# Patient Record
Sex: Male | Born: 1945 | Race: White | Hispanic: No | Smoking: Former smoker
Health system: Southern US, Community
[De-identification: ages and names within clinical notes are randomized; demographics above are authoritative.]

## PROBLEM LIST (undated history)

## (undated) DIAGNOSIS — I25810 Atherosclerosis of coronary artery bypass graft(s) without angina pectoris: Secondary | ICD-10-CM

## (undated) DIAGNOSIS — K1370 Unspecified lesions of oral mucosa: Secondary | ICD-10-CM

## (undated) DIAGNOSIS — R0600 Dyspnea, unspecified: Secondary | ICD-10-CM

## (undated) DIAGNOSIS — R011 Cardiac murmur, unspecified: Secondary | ICD-10-CM

## (undated) DIAGNOSIS — M1990 Unspecified osteoarthritis, unspecified site: Secondary | ICD-10-CM

## (undated) DIAGNOSIS — I350 Nonrheumatic aortic (valve) stenosis: Secondary | ICD-10-CM

## (undated) DIAGNOSIS — I712 Thoracic aortic aneurysm, without rupture: Secondary | ICD-10-CM

## (undated) DIAGNOSIS — I509 Heart failure, unspecified: Secondary | ICD-10-CM

## (undated) DIAGNOSIS — K219 Gastro-esophageal reflux disease without esophagitis: Secondary | ICD-10-CM

## (undated) DIAGNOSIS — I7121 Aneurysm of the ascending aorta, without rupture: Secondary | ICD-10-CM

## (undated) DIAGNOSIS — J449 Chronic obstructive pulmonary disease, unspecified: Secondary | ICD-10-CM

## (undated) DIAGNOSIS — I10 Essential (primary) hypertension: Secondary | ICD-10-CM

## (undated) DIAGNOSIS — Z720 Tobacco use: Secondary | ICD-10-CM

## (undated) DIAGNOSIS — Z973 Presence of spectacles and contact lenses: Secondary | ICD-10-CM

## (undated) HISTORY — PX: COLONOSCOPY: SHX174

## (undated) HISTORY — DX: Atherosclerosis of coronary artery bypass graft(s) without angina pectoris: I25.810

## (undated) HISTORY — DX: Aneurysm of the ascending aorta, without rupture: I71.21

## (undated) HISTORY — DX: Tobacco use: Z72.0

## (undated) HISTORY — PX: AORTIC VALVE REPLACEMENT: SHX41

## (undated) HISTORY — PX: TONSILLECTOMY: SUR1361

## (undated) HISTORY — DX: Chronic obstructive pulmonary disease, unspecified: J44.9

## (undated) HISTORY — DX: Nonrheumatic aortic (valve) stenosis: I35.0

## (undated) HISTORY — DX: Unspecified osteoarthritis, unspecified site: M19.90

## (undated) HISTORY — DX: Thoracic aortic aneurysm, without rupture: I71.2

## (undated) HISTORY — PX: MULTIPLE TOOTH EXTRACTIONS: SHX2053

---

## 1998-12-22 ENCOUNTER — Emergency Department (HOSPITAL_COMMUNITY): Admission: EM | Admit: 1998-12-22 | Discharge: 1998-12-22 | Payer: Self-pay | Admitting: Emergency Medicine

## 2002-03-04 ENCOUNTER — Emergency Department (HOSPITAL_COMMUNITY): Admission: EM | Admit: 2002-03-04 | Discharge: 2002-03-04 | Payer: Self-pay | Admitting: Emergency Medicine

## 2002-03-04 ENCOUNTER — Encounter: Payer: Self-pay | Admitting: Emergency Medicine

## 2002-03-21 ENCOUNTER — Ambulatory Visit (HOSPITAL_COMMUNITY): Admission: RE | Admit: 2002-03-21 | Discharge: 2002-03-21 | Payer: Self-pay | Admitting: Internal Medicine

## 2002-03-21 ENCOUNTER — Encounter (INDEPENDENT_AMBULATORY_CARE_PROVIDER_SITE_OTHER): Payer: Self-pay | Admitting: Cardiology

## 2004-11-23 ENCOUNTER — Inpatient Hospital Stay (HOSPITAL_COMMUNITY): Admission: EM | Admit: 2004-11-23 | Discharge: 2004-11-26 | Payer: Self-pay | Admitting: Emergency Medicine

## 2004-11-23 ENCOUNTER — Ambulatory Visit: Payer: Self-pay | Admitting: Internal Medicine

## 2004-11-24 ENCOUNTER — Encounter: Payer: Self-pay | Admitting: Cardiology

## 2004-11-24 ENCOUNTER — Ambulatory Visit: Payer: Self-pay | Admitting: Cardiology

## 2004-11-25 ENCOUNTER — Ambulatory Visit: Payer: Self-pay | Admitting: Cardiology

## 2004-12-23 ENCOUNTER — Ambulatory Visit: Payer: Self-pay | Admitting: Cardiology

## 2004-12-23 ENCOUNTER — Encounter (INDEPENDENT_AMBULATORY_CARE_PROVIDER_SITE_OTHER): Payer: Self-pay | Admitting: Internal Medicine

## 2004-12-27 ENCOUNTER — Ambulatory Visit: Payer: Self-pay | Admitting: Cardiology

## 2004-12-27 ENCOUNTER — Ambulatory Visit: Payer: Self-pay | Admitting: Internal Medicine

## 2005-01-12 ENCOUNTER — Ambulatory Visit: Payer: Self-pay | Admitting: Internal Medicine

## 2005-01-12 ENCOUNTER — Encounter (INDEPENDENT_AMBULATORY_CARE_PROVIDER_SITE_OTHER): Payer: Self-pay | Admitting: Internal Medicine

## 2005-03-09 ENCOUNTER — Ambulatory Visit: Payer: Self-pay | Admitting: Cardiology

## 2005-04-07 ENCOUNTER — Emergency Department (HOSPITAL_COMMUNITY): Admission: EM | Admit: 2005-04-07 | Discharge: 2005-04-07 | Payer: Self-pay | Admitting: Emergency Medicine

## 2005-04-12 ENCOUNTER — Ambulatory Visit: Payer: Self-pay | Admitting: Internal Medicine

## 2005-04-26 ENCOUNTER — Ambulatory Visit: Payer: Self-pay | Admitting: Internal Medicine

## 2005-05-03 ENCOUNTER — Ambulatory Visit: Payer: Self-pay | Admitting: Internal Medicine

## 2005-05-06 ENCOUNTER — Ambulatory Visit: Payer: Self-pay | Admitting: Internal Medicine

## 2005-05-13 ENCOUNTER — Ambulatory Visit: Payer: Self-pay | Admitting: Internal Medicine

## 2005-05-25 ENCOUNTER — Ambulatory Visit: Payer: Self-pay | Admitting: Cardiology

## 2005-06-01 ENCOUNTER — Ambulatory Visit: Payer: Self-pay | Admitting: Internal Medicine

## 2005-06-29 ENCOUNTER — Ambulatory Visit: Payer: Self-pay | Admitting: Cardiology

## 2005-07-13 ENCOUNTER — Ambulatory Visit: Payer: Self-pay

## 2005-07-20 ENCOUNTER — Ambulatory Visit: Payer: Self-pay | Admitting: Cardiology

## 2005-10-28 ENCOUNTER — Ambulatory Visit: Payer: Self-pay | Admitting: Cardiology

## 2006-01-04 ENCOUNTER — Emergency Department (HOSPITAL_COMMUNITY): Admission: EM | Admit: 2006-01-04 | Discharge: 2006-01-05 | Payer: Self-pay | Admitting: *Deleted

## 2006-01-08 ENCOUNTER — Ambulatory Visit: Payer: Self-pay | Admitting: Internal Medicine

## 2006-01-08 ENCOUNTER — Ambulatory Visit: Payer: Self-pay | Admitting: Cardiology

## 2006-01-08 ENCOUNTER — Inpatient Hospital Stay (HOSPITAL_COMMUNITY): Admission: EM | Admit: 2006-01-08 | Discharge: 2006-02-02 | Payer: Self-pay | Admitting: Emergency Medicine

## 2006-01-09 ENCOUNTER — Encounter: Payer: Self-pay | Admitting: Internal Medicine

## 2006-01-12 ENCOUNTER — Ambulatory Visit: Payer: Self-pay | Admitting: Dentistry

## 2006-01-12 ENCOUNTER — Encounter: Payer: Self-pay | Admitting: Vascular Surgery

## 2006-01-18 ENCOUNTER — Encounter (INDEPENDENT_AMBULATORY_CARE_PROVIDER_SITE_OTHER): Payer: Self-pay | Admitting: Specialist

## 2006-01-30 ENCOUNTER — Encounter: Payer: Self-pay | Admitting: Vascular Surgery

## 2006-02-16 ENCOUNTER — Ambulatory Visit: Payer: Self-pay | Admitting: Cardiology

## 2006-02-21 ENCOUNTER — Encounter
Admission: RE | Admit: 2006-02-21 | Discharge: 2006-02-21 | Payer: Self-pay | Admitting: Thoracic Surgery (Cardiothoracic Vascular Surgery)

## 2006-03-27 ENCOUNTER — Ambulatory Visit: Payer: Self-pay

## 2006-03-27 ENCOUNTER — Ambulatory Visit: Payer: Self-pay | Admitting: Cardiology

## 2006-04-12 ENCOUNTER — Ambulatory Visit (HOSPITAL_COMMUNITY): Admission: RE | Admit: 2006-04-12 | Discharge: 2006-04-12 | Payer: Self-pay | Admitting: Internal Medicine

## 2006-04-12 ENCOUNTER — Ambulatory Visit: Payer: Self-pay | Admitting: Cardiology

## 2006-04-12 ENCOUNTER — Ambulatory Visit: Payer: Self-pay | Admitting: Internal Medicine

## 2006-04-26 ENCOUNTER — Ambulatory Visit: Payer: Self-pay | Admitting: Hospitalist

## 2006-05-03 ENCOUNTER — Ambulatory Visit (HOSPITAL_COMMUNITY): Admission: RE | Admit: 2006-05-03 | Discharge: 2006-05-03 | Payer: Self-pay | Admitting: Hospitalist

## 2006-07-03 ENCOUNTER — Ambulatory Visit: Payer: Self-pay | Admitting: Cardiology

## 2006-10-10 DIAGNOSIS — J441 Chronic obstructive pulmonary disease with (acute) exacerbation: Secondary | ICD-10-CM | POA: Insufficient documentation

## 2006-10-10 DIAGNOSIS — Z954 Presence of other heart-valve replacement: Secondary | ICD-10-CM | POA: Insufficient documentation

## 2006-10-10 DIAGNOSIS — I359 Nonrheumatic aortic valve disorder, unspecified: Secondary | ICD-10-CM | POA: Insufficient documentation

## 2006-10-10 DIAGNOSIS — M1990 Unspecified osteoarthritis, unspecified site: Secondary | ICD-10-CM | POA: Insufficient documentation

## 2006-12-29 ENCOUNTER — Ambulatory Visit: Payer: Self-pay | Admitting: Cardiology

## 2007-01-08 ENCOUNTER — Ambulatory Visit: Payer: Self-pay | Admitting: Cardiology

## 2007-01-08 LAB — CONVERTED CEMR LAB
ALT: 19 units/L (ref 0–40)
AST: 19 units/L (ref 0–37)
Albumin: 3.6 g/dL (ref 3.5–5.2)
Alkaline Phosphatase: 40 units/L (ref 39–117)
Bilirubin, Direct: 0.1 mg/dL (ref 0.0–0.3)
Cholesterol: 148 mg/dL (ref 0–200)
HDL: 54.2 mg/dL (ref >39.0)
LDL Cholesterol: 71 mg/dL (ref 0–99)
Total Bilirubin: 0.6 mg/dL (ref 0.3–1.2)
Total CHOL/HDL Ratio: 2.7
Total Protein: 6.1 g/dL (ref 6.0–8.3)
Triglycerides: 112 mg/dL (ref 0–149)
VLDL: 22 mg/dL (ref 0–40)

## 2007-04-13 ENCOUNTER — Telehealth (INDEPENDENT_AMBULATORY_CARE_PROVIDER_SITE_OTHER): Payer: Self-pay | Admitting: *Deleted

## 2007-05-04 ENCOUNTER — Telehealth (INDEPENDENT_AMBULATORY_CARE_PROVIDER_SITE_OTHER): Payer: Self-pay | Admitting: *Deleted

## 2007-06-11 ENCOUNTER — Ambulatory Visit: Payer: Self-pay | Admitting: Cardiology

## 2007-06-25 ENCOUNTER — Ambulatory Visit: Payer: Self-pay | Admitting: Internal Medicine

## 2007-06-25 DIAGNOSIS — G2581 Restless legs syndrome: Secondary | ICD-10-CM | POA: Insufficient documentation

## 2007-07-11 ENCOUNTER — Encounter (INDEPENDENT_AMBULATORY_CARE_PROVIDER_SITE_OTHER): Payer: Self-pay | Admitting: Infectious Diseases

## 2007-07-11 ENCOUNTER — Ambulatory Visit: Payer: Self-pay | Admitting: Internal Medicine

## 2007-07-11 LAB — CONVERTED CEMR LAB
ALT: 13 units/L (ref 0–53)
AST: 15 units/L (ref 0–37)
Albumin: 4.3 g/dL (ref 3.5–5.2)
Alkaline Phosphatase: 44 units/L (ref 39–117)
BUN: 16 mg/dL (ref 6–23)
CO2: 26 meq/L (ref 19–32)
Calcium: 8.7 mg/dL (ref 8.4–10.5)
Chloride: 107 meq/L (ref 96–112)
Creatinine, Ser: 0.99 mg/dL (ref 0.40–1.50)
Glucose, Bld: 91 mg/dL (ref 70–99)
Potassium: 4.4 meq/L (ref 3.5–5.3)
Sodium: 141 meq/L (ref 135–145)
Total Bilirubin: 0.6 mg/dL (ref 0.3–1.2)
Total Protein: 6.7 g/dL (ref 6.0–8.3)

## 2007-09-06 ENCOUNTER — Telehealth: Payer: Self-pay | Admitting: Internal Medicine

## 2007-12-07 ENCOUNTER — Encounter: Payer: Self-pay | Admitting: Cardiology

## 2007-12-07 ENCOUNTER — Ambulatory Visit: Payer: Self-pay

## 2007-12-07 ENCOUNTER — Ambulatory Visit: Payer: Self-pay | Admitting: Cardiology

## 2007-12-24 IMAGING — CR DG CHEST 2V
2 series · 2 of 2 positions shown · non-contrast
Comparison: none

CLINICAL DATA: Shortness of breath. 
 CHEST ? 2 VIEW:

[w chest pa]
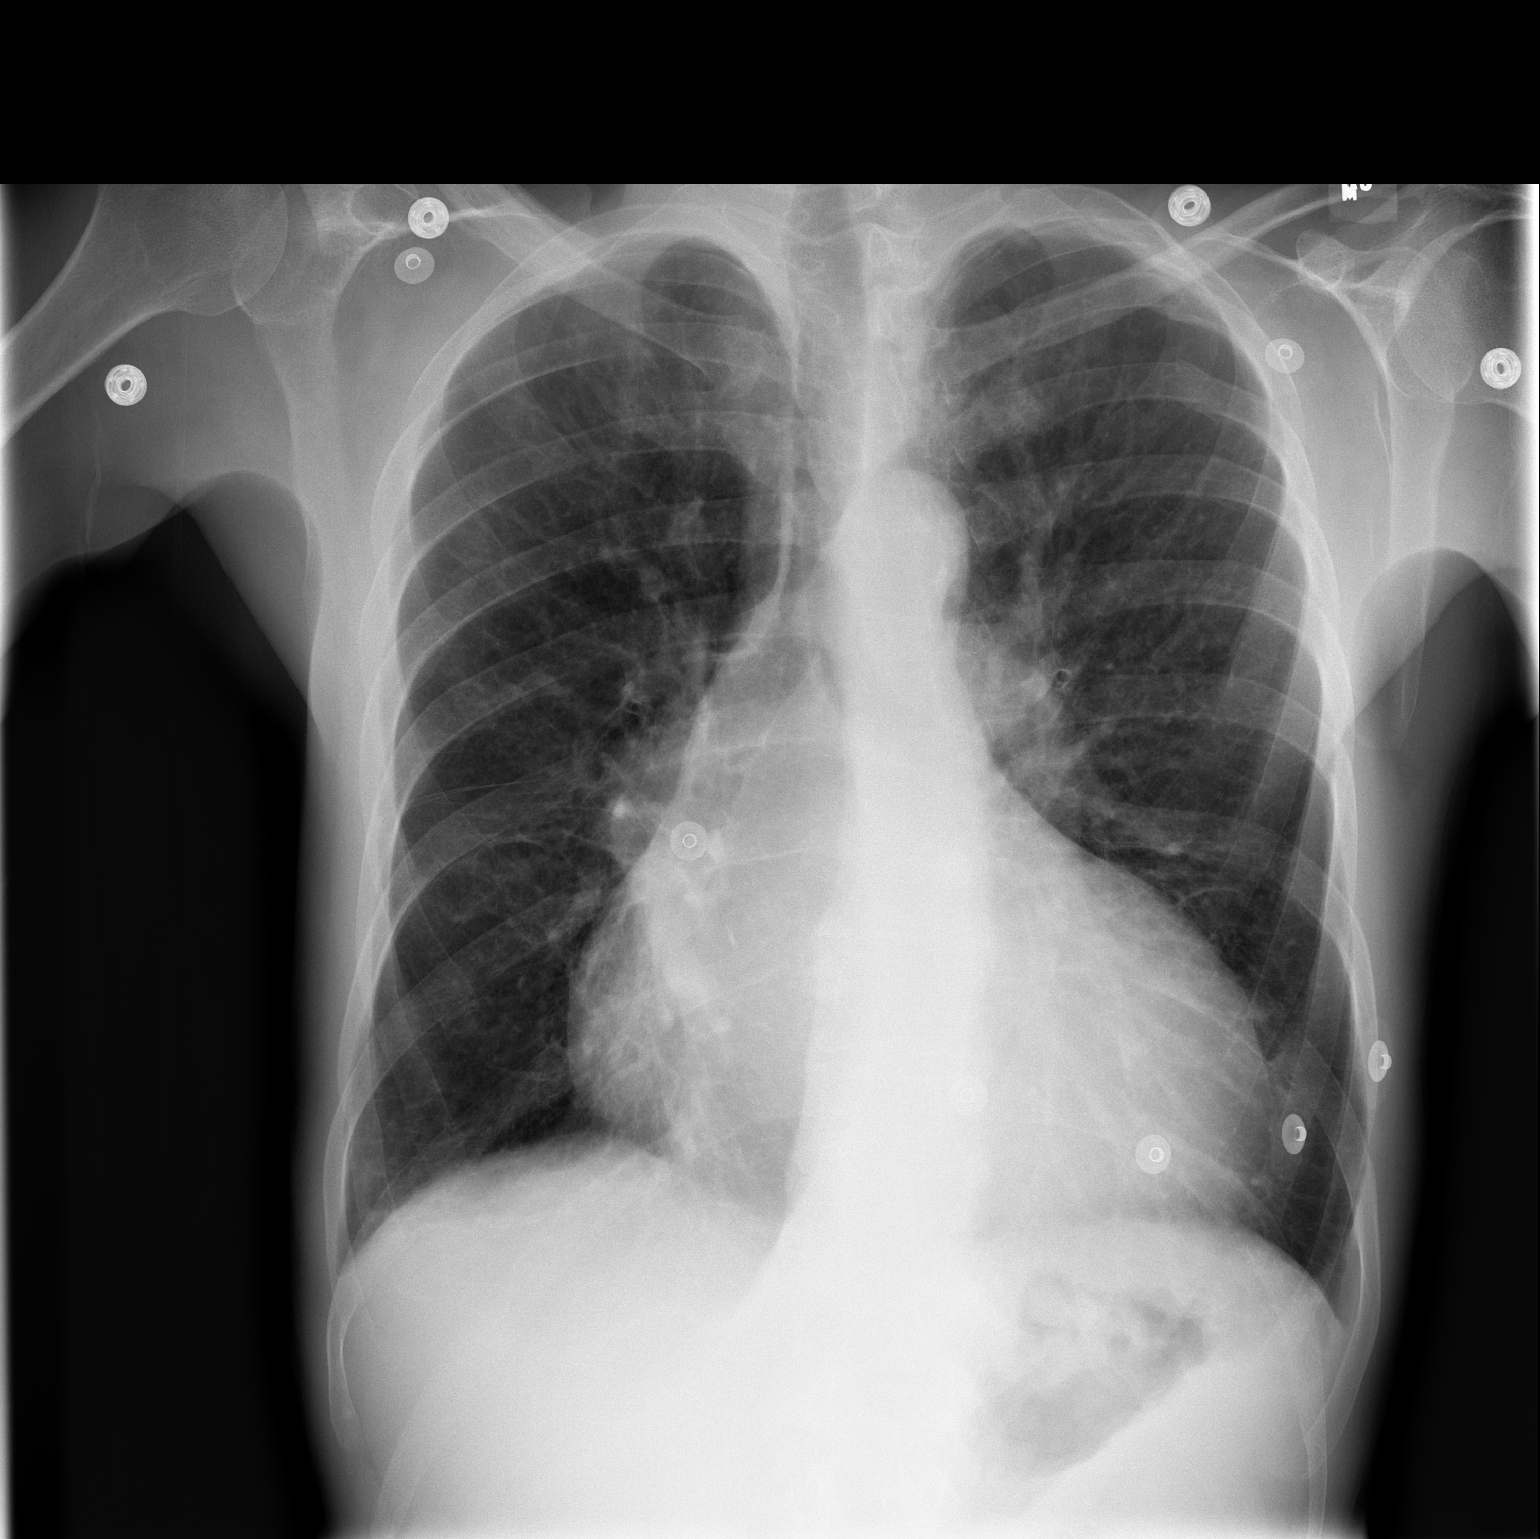

[w chest lat]
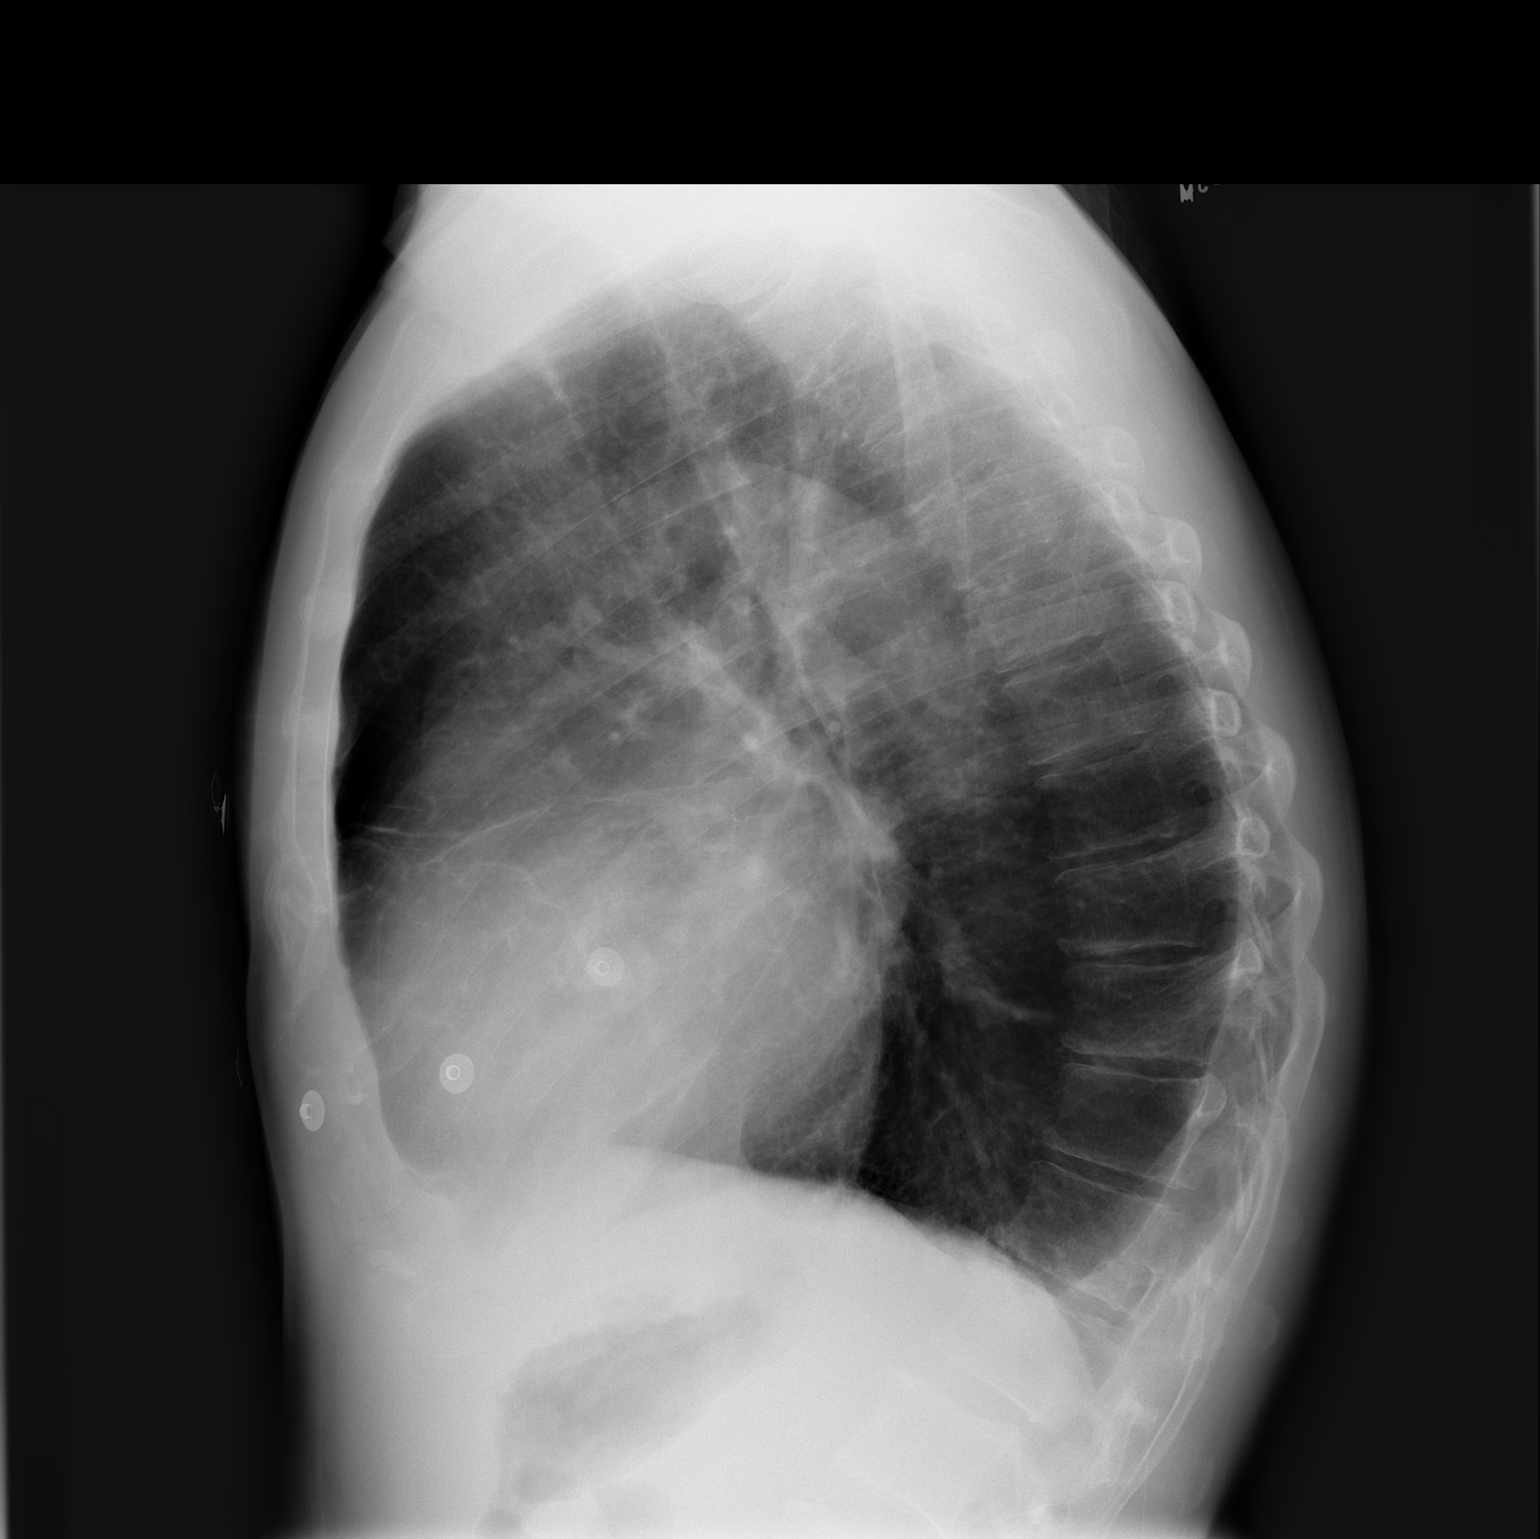

[2 of 2 positions shown; findings below may reference images not displayed]

FINDINGS: Hyperaerated lungs.  Enlarged cardiopericardial silhouette.  Redistribution of the pulmonary blood flow to the upper lung zones compatible with pulmonary venous hypertension.  No pulmonary edema.  Thoracic scoliosis convex left.
IMPRESSION: Findings compatible with COPD.  Cardiomegaly and pulmonary venous hypertension.

## 2008-02-04 ENCOUNTER — Telehealth (INDEPENDENT_AMBULATORY_CARE_PROVIDER_SITE_OTHER): Payer: Self-pay | Admitting: Internal Medicine

## 2008-02-11 ENCOUNTER — Telehealth: Payer: Self-pay | Admitting: Internal Medicine

## 2008-05-20 ENCOUNTER — Telehealth (INDEPENDENT_AMBULATORY_CARE_PROVIDER_SITE_OTHER): Payer: Self-pay | Admitting: Internal Medicine

## 2008-09-04 ENCOUNTER — Telehealth (INDEPENDENT_AMBULATORY_CARE_PROVIDER_SITE_OTHER): Payer: Self-pay | Admitting: Internal Medicine

## 2008-09-18 ENCOUNTER — Encounter (INDEPENDENT_AMBULATORY_CARE_PROVIDER_SITE_OTHER): Payer: Self-pay | Admitting: Internal Medicine

## 2008-09-29 ENCOUNTER — Telehealth (INDEPENDENT_AMBULATORY_CARE_PROVIDER_SITE_OTHER): Payer: Self-pay | Admitting: Internal Medicine

## 2009-06-17 ENCOUNTER — Telehealth: Payer: Self-pay | Admitting: Cardiovascular Disease

## 2010-06-21 ENCOUNTER — Ambulatory Visit: Payer: Self-pay | Admitting: Physician Assistant

## 2010-06-21 DIAGNOSIS — M546 Pain in thoracic spine: Secondary | ICD-10-CM | POA: Insufficient documentation

## 2010-06-21 DIAGNOSIS — I429 Cardiomyopathy, unspecified: Secondary | ICD-10-CM | POA: Insufficient documentation

## 2010-06-21 DIAGNOSIS — E785 Hyperlipidemia, unspecified: Secondary | ICD-10-CM | POA: Insufficient documentation

## 2010-06-21 DIAGNOSIS — R82998 Other abnormal findings in urine: Secondary | ICD-10-CM | POA: Insufficient documentation

## 2010-06-21 LAB — CONVERTED CEMR LAB
Bilirubin Urine: NEGATIVE
Blood in Urine, dipstick: NEGATIVE
Glucose, Urine, Semiquant: NEGATIVE
Ketones, urine, test strip: NEGATIVE
Nitrite: NEGATIVE
Protein, U semiquant: NEGATIVE
Specific Gravity, Urine: 1.02
Urobilinogen, UA: 0.2
pH: 5.5

## 2010-06-22 ENCOUNTER — Encounter: Payer: Self-pay | Admitting: Physician Assistant

## 2010-06-22 LAB — CONVERTED CEMR LAB
ALT: 11 units/L (ref 0–53)
AST: 16 units/L (ref 0–37)
Albumin: 4 g/dL (ref 3.5–5.2)
Alkaline Phosphatase: 48 units/L (ref 39–117)
Amphetamine Screen, Ur: NEGATIVE
BUN: 10 mg/dL (ref 6–23)
Barbiturate Quant, Ur: NEGATIVE
Basophils Absolute: 0.1 10*3/uL (ref 0.0–0.1)
Basophils Relative: 2 % — ABNORMAL HIGH (ref 0–1)
Benzodiazepines.: NEGATIVE
CO2: 26 meq/L (ref 19–32)
Calcium: 9 mg/dL (ref 8.4–10.5)
Chloride: 105 meq/L (ref 96–112)
Cholesterol: 177 mg/dL (ref 0–200)
Cocaine Metabolites: POSITIVE — AB
Creatinine, Ser: 1.03 mg/dL (ref 0.40–1.50)
Creatinine,U: 121.5 mg/dL
Eosinophils Absolute: 0.2 10*3/uL (ref 0.0–0.7)
Eosinophils Relative: 4 % (ref 0–5)
Glucose, Bld: 92 mg/dL (ref 70–99)
HCT: 43.1 % (ref 39.0–52.0)
HDL: 76 mg/dL (ref >39)
Hemoglobin: 14.7 g/dL (ref 13.0–17.0)
LDL Cholesterol: 93 mg/dL (ref 0–99)
Lymphocytes Relative: 20 % (ref 12–46)
Lymphs Abs: 1.2 10*3/uL (ref 0.7–4.0)
MCHC: 34.1 g/dL (ref 30.0–36.0)
MCV: 89.4 fL (ref 78.0–100.0)
Marijuana Metabolite: NEGATIVE
Methadone: NEGATIVE
Monocytes Absolute: 0.7 10*3/uL (ref 0.1–1.0)
Monocytes Relative: 12 % (ref 3–12)
Neutro Abs: 3.6 10*3/uL (ref 1.7–7.7)
Neutrophils Relative %: 63 % (ref 43–77)
Opiate Screen, Urine: NEGATIVE
Phencyclidine (PCP): NEGATIVE
Platelets: 192 10*3/uL (ref 150–400)
Potassium: 4.2 meq/L (ref 3.5–5.3)
Propoxyphene: NEGATIVE
RBC: 4.82 M/uL (ref 4.22–5.81)
RDW: 13.9 % (ref 11.5–15.5)
Sodium: 142 meq/L (ref 135–145)
Total Bilirubin: 0.4 mg/dL (ref 0.3–1.2)
Total CHOL/HDL Ratio: 2.3
Total Protein: 6.6 g/dL (ref 6.0–8.3)
Triglycerides: 42 mg/dL (ref <150)
VLDL: 8 mg/dL (ref 0–40)
WBC: 5.8 10*3/uL (ref 4.0–10.5)

## 2010-06-23 ENCOUNTER — Encounter (INDEPENDENT_AMBULATORY_CARE_PROVIDER_SITE_OTHER): Payer: Self-pay | Admitting: *Deleted

## 2010-07-12 ENCOUNTER — Ambulatory Visit: Payer: Self-pay | Admitting: Cardiovascular Disease

## 2010-07-12 DIAGNOSIS — F17200 Nicotine dependence, unspecified, uncomplicated: Secondary | ICD-10-CM | POA: Insufficient documentation

## 2010-07-20 ENCOUNTER — Ambulatory Visit (HOSPITAL_COMMUNITY): Admission: RE | Admit: 2010-07-20 | Discharge: 2010-07-20 | Payer: Self-pay | Admitting: Cardiovascular Disease

## 2010-07-20 ENCOUNTER — Ambulatory Visit: Payer: Self-pay | Admitting: Internal Medicine

## 2010-07-20 ENCOUNTER — Ambulatory Visit: Payer: Self-pay

## 2010-07-21 ENCOUNTER — Telehealth: Payer: Self-pay | Admitting: Physician Assistant

## 2010-09-15 ENCOUNTER — Telehealth: Payer: Self-pay | Admitting: Physician Assistant

## 2010-09-21 ENCOUNTER — Encounter: Admission: RE | Admit: 2010-09-21 | Payer: Self-pay | Source: Home / Self Care | Admitting: Internal Medicine

## 2010-09-28 ENCOUNTER — Encounter: Admission: RE | Admit: 2010-09-28 | Payer: Self-pay | Source: Home / Self Care | Admitting: Internal Medicine

## 2010-10-20 ENCOUNTER — Ambulatory Visit: Admit: 2010-10-20 | Payer: Self-pay | Admitting: Nurse Practitioner

## 2010-11-02 NOTE — Progress Notes (Signed)
Summary: med refill/gp  Phone Note Refill Request Message from:  Fax from Pharmacy on Feb 04, 2008 9:42 AM  Refills Requested: Medication #1:  VENTOLIN HFA 108 (90 BASE) MCG/ACT  AERS Inhale 2 puffs four times a day as needed   Last Refilled: 01/26/2008  Method Requested: electronic Initial call taken by: Chinita Pester RN,  Feb 04, 2008 9:42 AM  Follow-up for Phone Call        Rx called to pharmacy      Prescriptions: VENTOLIN HFA 108 (90 BASE) MCG/ACT  AERS (ALBUTEROL SULFATE) Inhale 2 puffs four times a day as needed  #1 mo. supply x 3   Entered and Authorized by:   Zara Council MD   Signed by:   Zara Council MD on 02/04/2008   Method used:   Electronically sent to ...       [facility] [id number]       [address]       [place], [place]  [address]       Ph: [phone]       Fax: [phone]   RxID:   [id number]

## 2010-11-02 NOTE — Letter (Signed)
Summary: PT INFORMATION SHEET  PT INFORMATION SHEET   Imported By: Arta Bruce 06/25/2010 12:35:46  _____________________________________________________________________  External Attachment:    Type:   Image     Comment:   External Document

## 2010-11-02 NOTE — Progress Notes (Signed)
Summary: refill/gg  Phone Note Refill Request  on April 13, 2007 3:32 PM  Refills Requested: Medication #1:  ALBUTEROL 90 MCG/ACT AERS Inhale 2 puffs every 4 hours as needed for shortness of breath.   Last Refilled: 03/29/2007 Initial call taken by: Merrie Roof RN,  April 13, 2007 3:32 PM  Follow-up for Phone Call        Refill approved-nurse to complete Follow-up by: Eliseo Gum MD,  April 13, 2007 3:49 PM  Additional Follow-up for Phone Call Additional follow up Details #1::        Rx faxed to pharmacy Additional Follow-up by: Merrie Roof RN,  April 13, 2007 4:03 PM     Prescriptions: ALBUTEROL 90 MCG/ACT AERS (ALBUTEROL) Inhale 2 puffs every 4 hours as needed for shortness of breath  #1 mo supply x 3   Entered and Authorized by:   Eliseo Gum MD   Signed by:   Eliseo Gum MD on 04/13/2007   Method used:   Telephoned to ...         RxID:   [id number]

## 2010-11-02 NOTE — Letter (Signed)
Summary: *HSN Results Follow up  [facility]  [address] [place], [place] [address]   Phone: [phone]  Fax: [phone]      06/23/2010   [name] [name] [address] [place], [place]  [address]   Dear  [name],                            ____S.Drinkard,FNP   ____D. Gore,FNP       ____B. McPherson,MD   ____V. Rankins,MD    ____E. Mulberry,MD    ____N. Daphine Deutscher, FNP  ____D. Reche Dixon, MD    ____K. Philipp Deputy, MD    ____Other     This letter is to inform you that your recent test(s):  _______Pap Smear    ____X___Lab Test     _______X-ray    ___X____ is within acceptable limits  _______ requires a medication change  _______ requires a follow-up lab visit  _______ requires a follow-up visit with your provider   Comments: Your cholestrol is better continue same dose of medicine.       _________________________________________________________ If you have any questions, please contact our office                     Sincerely,  [name] [facility]

## 2010-11-02 NOTE — Letter (Signed)
Summary: *HSN Results Follow up  [facility]  [address] [place], [place] [address]   Phone: [phone]  Fax: [phone]      06/23/2010   [name] [name] [address] [place], [place]  [address]   Dear  Mr. [facility],                            ____S.Drinkard,FNP   ____D. Gore,FNP       ____B. McPherson,MD   ____V. Rankins,MD    ____E. Mulberry,MD    ____N. Daphine Deutscher, FNP  ____D. Reche Dixon, MD    ____K. Philipp Deputy, MD    ____Other     This letter is to inform you that your recent test(s):  _______Pap Smear    ___X____Lab Test     _______X-ray    ___X____ is within acceptable limits  _______ requires a medication change  _______ requires a follow-up lab visit  _______ requires a follow-up visit with your Juventino Pavone   Comments: Your urine test is negative.       _________________________________________________________ If you have any questions, please contact our office                     Sincerely,  [name] [name] [facility]

## 2010-11-02 NOTE — Progress Notes (Signed)
Summary: Rx refill/dms  Phone Note Refill Request Message from:  Fax from Pharmacy on May 04, 2007 3:52 PM  Refills Requested: Medication #1:  SPIRIVA HANDIHALER 18 MCG CAPS Inhale one capsule daily   Last Refilled: 04/04/2007  Method Requested: Electronic Initial call taken by: Henderson Cloud,  May 04, 2007 3:52 PM  Follow-up for Phone Call        Refilled electronically.  Please schedule an appointment with patient's primary MD.  Follow-up by: Margarito Liner MD,  May 04, 2007 3:56 PM  Additional Follow-up for Phone Call Additional follow up Details #1::        Flag sent to [facility] to schedule appt. Additional Follow-up by: Henderson Cloud,  May 04, 2007 4:14 PM    New/Updated Medications: SPIRIVA HANDIHALER 18 MCG CAPS (TIOTROPIUM BROMIDE MONOHYDRATE) Inhale contents of one capsule daily   Prescriptions: SPIRIVA HANDIHALER 18 MCG CAPS (TIOTROPIUM BROMIDE MONOHYDRATE) Inhale contents of one capsule daily  #1 x 3   Entered and Authorized by:   Margarito Liner MD   Signed by:   Margarito Liner MD on 05/04/2007   Method used:   Electronically sent to ...       [facility] [address]       [address] [place], [place]  [address]       Ph: [phone]       Fax: [phone]   RxID:   [id number]

## 2010-11-02 NOTE — Progress Notes (Signed)
Summary: rx lovastatin 20  Phone Note Refill Request Call back at Home Phone [phone] Message from:  Patient on June 17, 2009 2:06 PM  Refills Requested: Medication #1:  LOVASTATIN 20 MG TABS Take 1 tablet by mouth once a day [facility] [phone]   Method Requested: Fax to Local Pharmacy Initial call taken by: Lorne Skeens,  June 17, 2009 2:07 PM    Prescriptions: LOVASTATIN 20 MG TABS (LOVASTATIN) Take 1 tablet by mouth once a day  #30 x 11   Entered by:   Danielle Rankin, CMA   Authorized by:   Verne Carrow, MD   Signed by:   Danielle Rankin, CMA on 06/17/2009   Method used:   Electronically to        [facility] [id number] (retail)       [address] [place], [place]  [address]       Ph: [phone]       Fax: [phone]   RxID:   [id number]

## 2010-11-02 NOTE — Progress Notes (Signed)
Summary: Refill/gh  Phone Note Refill Request Message from:  Pharmacy on September 29, 2008 8:55 AM  Refills Requested: Medication #1:  VENTOLIN HFA 108 (90 BASE) MCG/ACT  AERS Inhale 2 puffs four times a day as needed  Method Requested: Electronic Initial call taken by: Angelina Ok RN,  September 29, 2008 11:56 AM      Prescriptions: VENTOLIN HFA 108 (90 BASE) MCG/ACT  AERS (ALBUTEROL SULFATE) Inhale 2 puffs four times a day as needed  #1 mo. supply x 6   Entered and Authorized by:   Zara Council MD   Signed by:   Zara Council MD on 09/29/2008   Method used:   Electronically to        [facility] [address] (retail)       [address] [place], [place]  [address]       Ph: [phone]       Fax: [phone]   RxID:   [id number]

## 2010-11-02 NOTE — Consult Note (Signed)
Summary: [facility]: Dr Barnabas Lister [facility]: Dr Gala Romney   Imported By: Florinda Marker 04/11/2007 15:06:40  _____________________________________________________________________  External Attachment:    Type:   Image     Comment:   External Document

## 2010-11-02 NOTE — Letter (Signed)
Summary: [facility]: CMN  [facility]: CMN   Imported By: Florinda Marker 09/22/2008 15:34:36  _____________________________________________________________________  External Attachment:    Type:   Image     Comment:   External Document

## 2010-11-02 NOTE — Consult Note (Signed)
Summary: [facility]: Dr. Darryl Nestle [facility]iology: Dr. Daleen Squibb   Imported By: Florinda Marker 04/11/2007 15:08:48  _____________________________________________________________________  External Attachment:    Type:   Image     Comment:   External Document

## 2010-11-02 NOTE — Progress Notes (Signed)
Summary: refill/ hla  Phone Note Refill Request Message from:  Fax from Pharmacy on Feb 11, 2008 3:25 PM  Refills Requested: Medication #1:  SPIRIVA HANDIHALER 18 MCG CAPS Inhale contents of one capsule daily   Last Refilled: 3/28 Initial call taken by: Marin Roberts RN,  Feb 11, 2008 3:25 PM  Follow-up for Phone Call        Refill approved-nurse to complete Follow-up by: Ulyess Mort MD,  Feb 12, 2008 10:00 AM      Prescriptions: SPIRIVA HANDIHALER 18 MCG CAPS (TIOTROPIUM BROMIDE MONOHYDRATE) Inhale contents of one capsule daily  #30 Capsule x 4   Entered and Authorized by:   Ulyess Mort MD   Signed by:   Ulyess Mort MD on 02/12/2008   Method used:   Electronically sent to ...       [facility] [id number]       [address]       [place], [place]  [address]       Ph: [phone]       Fax: [phone]   RxID:   [id number]

## 2010-11-02 NOTE — Assessment & Plan Note (Signed)
Summary: EST-CK/FU/MEDS/CFB   Vital Signs:  Patient Profile:   65 Years Old Male Weight:      149.8 pounds (68.09 kg) Temp:     96.7 degrees F (35.94 degrees C) oral Pulse rate:   62 / minute BP sitting:   139 / 82  (right arm)  Pt. in pain?   yes    Location:   right hand    Intensity:   5  Vitals Entered By: Stanton Kidney Ditzler RN (June 25, 2007 3:50 PM)              Is Patient Diabetic? No Nutritional Status Detail good  Have you ever been in a relationship where you felt threatened, hurt or afraid?denies   Does patient need assistance? Functional Status Self care Ambulation Normal Comments Takes care of mother at home.     Chief Complaint:  Meet new doctor and problems sleeping.Marland Kitchen  History of Present Illness: [name] [name] is a 65 yo pleasant gentleman who comes to the clinic today to establish care with his primary care physician. He is s/p bioprosthetic aortic valve replacement for aortic stenosis (with Dr. Diona Browner at [facility]). He also has COPD. He reports having to use his prn ventolin more often than usual because "he is trying to be more active than before" that leaves him short of breath. Denies any fever, cough, chest pain, leg swelling or any other systemic complaints.   Current Allergies (reviewed today): No known allergies   Past Medical History:    Reviewed history from 10/10/2006 and no changes required:       COPD, FEV1/FVC -> 0.52 (69% of predicted)       Aortic stenosis, s/p bioprosthetic valve replacement       Osteoarthritis  Past Surgical History:    Reviewed history from 10/10/2006 and no changes required:       Aortic Valve Replacement: bioprosthetic, 01/2006    Risk Factors:  Tobacco use:  quit   Review of Systems      See HPI   Physical Exam  General:     alert and healthy-appearing.   Head:     normocephalic.   Eyes:     vision grossly intact.   Ears:     no external deformities.   Nose:     no external deformity.    Mouth:     pharynx pink and moist and teeth missing.   Neck:     supple.   Lungs:     normal respiratory effort and normal breath sounds.   Heart:     normal rate and regular rhythm.   Abdomen:     soft and non-tender.   Neurologic:     alert & oriented X3 and cranial nerves II-XII intact.      Impression & Recommendations:  Problem # 1:  COPD (ICD-496) Since he is having to use his as needed albuterol quite so often, I have, following discussion with Dr. Aundria Rud, decided to give him a trial of advair inhalers. Will review him again during next follow-up visit.   His updated medication list for this problem includes:    Spiriva Handihaler 18 Mcg Caps (Tiotropium bromide monohydrate) ..... Inhale contents of one capsule daily    Albuterol 90 Mcg/act Aers (Albuterol) ..... Inhale 2 puffs every 4 hours as needed for shortness of breath    Advair Diskus 100-50 Mcg/dose Misc (Fluticasone-salmeterol) ..... Inhale two times a day   Problem # 2:  RESTLESS LEG SYNDROME (  ICD-333.94) Patient's insomnia is most likely secondary to restless leg syndrome. I am going to give him a trial of Requip 1mg  hs. Will review him in two weeks at which time we will consider adjusting the dose based on response.   Complete Medication List: 1)  Aspirin 325 Mg Tabs (Aspirin) .... Take 1 tablet by mouth once a day 2)  Zebeta 5 Mg Tabs (Bisoprolol fumarate) .... Take 1/2 tablet by mouth once a day 3)  Lovastatin 20 Mg Tabs (Lovastatin) .... Take 1 tablet by mouth once a day 4)  Plavix 75 Mg Tabs (Clopidogrel bisulfate) .... Take 1 tablet by mouth once a day 5)  Multivitamins Tabs (Multiple vitamin) .... Take 1 tablet by mouth once a day 6)  Lasix 20 Mg Tabs (Furosemide) .... Take 1 tablet by mouth once a day 7)  Potassium Chloride 20 Meq Pack (Potassium chloride) .... Take 1 tablet by mouth once a day 8)  Spiriva Handihaler 18 Mcg Caps (Tiotropium bromide monohydrate) .... Inhale contents of one capsule  daily 9)  Albuterol 90 Mcg/act Aers (Albuterol) .... Inhale 2 puffs every 4 hours as needed for shortness of breath 10)  Requip 1 Mg Tabs (Ropinirole hcl) .... Take 1 tablet by mouth once a day one hour before the usual time of onset of restless leg 11)  Advair Diskus 100-50 Mcg/dose Misc (Fluticasone-salmeterol) .... Inhale two times a day   Patient Instructions: 1)  Please schedule a follow-up appointment in 2 weeks. 2)  Please collect your medicines from your pharmacy, we have done an electronic prescription for you.    Prescriptions: REQUIP 1 MG  TABS (ROPINIROLE HCL) Take 1 tablet by mouth once a day one hour before the usual time of onset of restless leg  #30 x 0   Entered and Authorized by:   Zara Council MD   Signed by:   Zara Council MD on 06/25/2007   Method used:   Electronically sent to ...       [facility] [address]       [address] [place], [place]  [address]       Ph: [phone]       Fax: [phone]   RxID:   [id number] ALBUTEROL 90 MCG/ACT AERS (ALBUTEROL) Inhale 2 puffs every 4 hours as needed for shortness of breath  #1 mo supply x 3   Entered and Authorized by:   Zara Council MD   Signed by:   Zara Council MD on 06/25/2007   Method used:   Electronically sent to ...       [facility] [address]       [address] [place], [place]  [address]       Ph: [phone]       Fax: [phone]   RxID:   [id number]  ]

## 2010-11-02 NOTE — Progress Notes (Signed)
Summary: BACK X-RAY TO BE RESCHED  Phone Note Call from Patient Call back at Home Phone [phone] Call back at [phone]   Reason for Call: Lab or Test Results Summary of Call: [name] pt. [name] [name] called and says that he was scheduled to [facility] 09[facility] for [facility]-ray on his back, but he couldn't go because his phone was off and we need to reschedule the appt. and let him know when it is. Initial call taken by: Leodis Rains,  July 21, 2010 8:40 AM  Follow-up for Phone Call        spoke with pt and he doesnt need appt for xray on back but i did update his order and asked him to come get it before going since the old one was outdated.... Follow-up by: [name],  July 21, 2010 11:17 AM

## 2010-11-02 NOTE — Assessment & Plan Note (Signed)
Summary: 2WK FU/SILWAL/VS   Vital Signs:  Patient Profile:   65 Years Old Male Height:     68 inches (172.72 cm) Weight:      151.1 pounds (68.68 kg) BMI:     23.06 Temp:     96.3 degrees F (35.72 degrees C) oral Pulse rate:   55 / minute BP sitting:   151 / 96  (right arm) Cuff size:   regular  Pt. in pain?   yes    Location:   WRISTS/THUMB    Intensity:   6    Type:       aching  Vitals Entered By: Theotis Barrio (July 11, 2007 3:13 PM)              Is Patient Diabetic? No Nutritional Status NORMAL  Have you ever been in a relationship where you felt threatened, hurt or afraid?No   Does patient need assistance? Functional Status Self care Ambulation Normal     Chief Complaint:  PCP -SILWAL / FOLLOW UP ON NEW MEDICATION / REFUSES FLU SHOT /.  History of Present Illness: Mr [name] [name] is a  65 yr male with a PMH significant for AS and aortic valve replacement, COPD presents for a follow up visit. He was seen 2 weeks ago for COPD and avair was started. He has done well on the advair and feels a lot better. SOB has improved significantly. He follows with Dr Diona Browner  for his cardiac problesm. He has not taken requip for the leg problems but has cut down on his cofee which has helped   Current Allergies: No known allergies  Updated/Current Medications (including changes made in today's visit):  ASPIRIN 325 MG TABS (ASPIRIN) Take 1 tablet by mouth once a day ZEBETA 5 MG TABS (BISOPROLOL FUMARATE) Take 1/2 tablet by mouth once a day LOVASTATIN 20 MG TABS (LOVASTATIN) Take 1 tablet by mouth once a day PLAVIX 75 MG TABS (CLOPIDOGREL BISULFATE) Take 1 tablet by mouth once a day MULTIVITAMINS  TABS (MULTIPLE VITAMIN) Take 1 tablet by mouth once a day FUROSEMIDE 40 MG  TABS (FUROSEMIDE) Take 1 tablet by mouth once a day POTASSIUM CHLORIDE 20 MEQ PACK (POTASSIUM CHLORIDE) Take 1 tablet by mouth once a day SPIRIVA HANDIHALER 18 MCG CAPS (TIOTROPIUM BROMIDE MONOHYDRATE) Inhale  contents of one capsule daily ALBUTEROL 90 MCG/ACT AERS (ALBUTEROL) Inhale 2 puffs every 4 hours as needed for shortness of breath ADVAIR DISKUS 100-50 MCG/DOSE  MISC (FLUTICASONE-SALMETEROL) Inhale two times a day   Past Medical History:    Reviewed history from 10/10/2006 and no changes required:       COPD, FEV1/FVC -> 0.52 (69% of predicted)       Aortic stenosis, s/p bioprosthetic valve replacement       Osteoarthritis    Risk Factors:  Tobacco use:  quit    Year quit:  2007 Alcohol use:  no Exercise:  yes    Times per week:  7    Type:  WALKING Seatbelt use:  100 %   Review of Systems  The patient denies anorexia, fever, weight loss, vision loss, decreased hearing, hoarseness, chest pain, syncope, dyspnea on exhertion, peripheral edema, prolonged cough, hemoptysis, abdominal pain, melena, hematochezia, and severe indigestion/heartburn.     Physical Exam  General:     alert and well-developed.   Head:     normocephalic and atraumatic.   Eyes:     vision grossly intact and pupils equal.   Neck:  supple and full ROM.   Lungs:     normal respiratory effort, no intercostal retractions, no accessory muscle use, normal breath sounds, no dullness, and no fremitus.   Heart:     normal rate, regular rhythm, no gallop, and no JVD.   Abdomen:     soft, non-tender, normal bowel sounds, no distention, no masses, no guarding, and no rigidity.   Msk:     normal ROM, no joint tenderness, no joint swelling, no joint warmth, no redness over joints, and no joint deformities.   Neurologic:     alert & oriented X3, cranial nerves II-XII intact, strength normal in all extremities, sensation intact to light touch, sensation intact to pinprick, gait normal, and DTRs symmetrical and normal.   Skin:     turgor normal, color normal, no rashes, and no suspicious lesions.   Psych:     Oriented X3 and memory intact for recent and remote.      Impression & Recommendations:  Problem  # 1:  COPD (ICD-496) SOB has improved. No wheeze on examination  and patient feels a lot better. He does not smoke. Will continue current medications His updated medication list for this problem includes: Spiriva Handihaler 18 Mcg Caps (Tiotropium bromide monohydrate) ..... Inhale contents of one capsule daily Albuterol 90 Mcg/act Aers (Albuterol) ..... Inhale 2 puffs every 4 hours as needed for shortness of breath Advair Diskus 100-50 Mcg/dose Misc (Fluticasone-salmeterol) ..... Inhale two times a day  Orders: T-Comprehensive Metabolic Panel ([id number])   Problem # 2:  AORTIC STENOSIS, SEVERE (ICD-424.1) He is follwed by Dr Diona Browner and was seen in 9/08.Initial BP was noted to be slightly elevated but on repeat exam was 130/80. His updated medication list for this problem includes: Aspirin 325 Mg Tabs (Aspirin) .Marland Kitchen... Take 1 tablet by mouth once a day  Zebeta 5 Mg Tabs (Bisoprolol fumarate) .Marland Kitchen... Take 1/2 tablet by mouth once a day  Plavix 75 Mg Tabs (Clopidogrel bisulfate) .Marland Kitchen... Take 1 tablet by mouth once a day  Orders: T-Comprehensive Metabolic Panel ([id number])   Problem # 3:  RESTLESS LEG SYNDROME (ICD-333.94) He did not take requip but feels better after cutting down on his coffee  Complete Medication List: 1)  Aspirin 325 Mg Tabs (Aspirin) .... Take 1 tablet by mouth once a day 2)  Zebeta 5 Mg Tabs (Bisoprolol fumarate) .... Take 1/2 tablet by mouth once a day 3)  Lovastatin 20 Mg Tabs (Lovastatin) .... Take 1 tablet by mouth once a day 4)  Plavix 75 Mg Tabs (Clopidogrel bisulfate) .... Take 1 tablet by mouth once a day 5)  Multivitamins Tabs (Multiple vitamin) .... Take 1 tablet by mouth once a day 6)  Furosemide 40 Mg Tabs (Furosemide) .... Take 1 tablet by mouth once a day 7)  Potassium Chloride 20 Meq Pack (Potassium chloride) .... Take 1 tablet by mouth once a day 8)  Spiriva Handihaler 18 Mcg Caps (Tiotropium bromide monohydrate) .... Inhale contents of one capsule  daily 9)  Albuterol 90 Mcg/act Aers (Albuterol) .... Inhale 2 puffs every 4 hours as needed for shortness of breath 10)  Advair Diskus 100-50 Mcg/dose Misc (Fluticasone-salmeterol) .... Inhale two times a day   Patient Instructions: 1)  Please schedule a follow-up appointment in 3 months. 2)  It is important that you exercise regularly at least 20 minutes 5 times a week. If you develop chest pain, have severe difficulty breathing, or feel very tired , stop exercising immediately and seek medical attention. 3)  You need to lose weight. Consider a lower calorie diet and regular exercise.     Prescriptions: ADVAIR DISKUS 100-50 MCG/DOSE  MISC (FLUTICASONE-SALMETEROL) Inhale two times a day  #1 month supp x 4   Entered and Authorized by:   Ronda Fairly MD   Signed by:   Ronda Fairly MD on 07/11/2007   Method used:   Electronically sent to ...       [facility] [address]       [address] [place], [place]  [address]       Ph: [phone]       Fax: [phone]   RxID:   [id number]  ]

## 2010-11-02 NOTE — Progress Notes (Signed)
Summary: refill/gg  Phone Note Refill Request  on September 04, 2008 4:44 PM  Refills Requested: Medication #1:  SPIRIVA HANDIHALER 18 MCG CAPS Inhale contents of one capsule daily   Last Refilled: 04/04/2007  Medication #2:  VENTOLIN HFA 108 (90 BASE) MCG/ACT  AERS Inhale 2 puffs four times a day as needed   Last Refilled: 04/29/2008  Method Requested: Electronic Initial call taken by: Merrie Roof RN,  September 04, 2008 4:45 PM  Follow-up for Phone Call        Rx called to pharmacy electronically.       Prescriptions: SPIRIVA HANDIHALER 18 MCG CAPS (TIOTROPIUM BROMIDE MONOHYDRATE) Inhale contents of one capsule daily  #31 x 3   Entered and Authorized by:   Zara Council MD   Signed by:   Zara Council MD on 09/05/2008   Method used:   Electronically to        [facility] [id number] (retail)       [address] [place], [place]  [address]       Ph: [phone]       Fax: [phone]   RxID:   [id number] VENTOLIN HFA 108 (90 BASE) MCG/ACT  AERS (ALBUTEROL SULFATE) Inhale 2 puffs four times a day as needed  #1 mo. supply x 3   Entered and Authorized by:   Zara Council MD   Signed by:   Zara Council MD on 09/05/2008   Method used:   Electronically to        [facility] [id number] (retail)       [address] [place], [place]  [address]       Ph: [phone]       Fax: [phone]   RxID:   [id number]

## 2010-11-02 NOTE — Progress Notes (Signed)
Summary: refill/gg  Phone Note Refill Request  on September 06, 2007 2:54 PM  Refills Requested: Medication #1:  ALBUTEROL 90 MCG/ACT AERS Inhale 2 puffs every 4 hours as needed for shortness of breath med not available can they change to ventolin HFA ?   Method Requested: electronic Initial call taken by: Merrie Roof RN,  September 06, 2007 2:55 PM    New/Updated Medications: VENTOLIN HFA 108 (90 BASE) MCG/ACT  AERS (ALBUTEROL SULFATE) Inhale 2 puffs four times a day as needed   Prescriptions: VENTOLIN HFA 108 (90 BASE) MCG/ACT  AERS (ALBUTEROL SULFATE) Inhale 2 puffs four times a day as needed  #1 x 5   Entered and Authorized by:   Margarito Liner MD   Signed by:   Margarito Liner MD on 09/06/2007   Method used:   Electronically sent to ...       [facility] [id number]       [address]       [place], [place]  [address]       Ph: [phone]       Fax: [phone]   RxID:   [id number]

## 2010-11-02 NOTE — Assessment & Plan Note (Signed)
Summary: ec6/cardiomyopathy/jml   Visit Type:  EC6 Primary Provider:  Tereso Newcomer, PA-C  CC:  pt saw Dr. Diona Browner in the past....sob/pt has O2....no other complaints today...pt has COPD.  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 Diona Browner. He was last seen by Dr. Diona Browner in March of 2009. He has not been in for follow up since then. An echo on December 07, 2007 showed normal LV systolic function with EF of 60%, functioning AV replacement with mild perivalvular leak, mild aortic root dilatation. LVEF prior to his AVR was known to be 25-30%.   He comes in today to re-establish cardiology care. He tells me that he has been well. He describes no chest pain. His breathing is stable. He wears oxygen at home when he needs it. He has stable NYHA class II symptoms. No near syncope or syncope. No PND, orthopnea or LE edema.     Current Medications (verified): 1)  Aspirin 325 Mg Tabs (Aspirin) .... Take 1 Tablet By Mouth Once A Day 2)  Lovastatin 20 Mg Tabs (Lovastatin) .... Take 1 Tablet By Mouth Once A Day 3)  Plavix 75 Mg Tabs (Clopidogrel Bisulfate) .... Take 1 Tablet By Mouth Once A Day 4)  Multivitamins  Tabs (Multiple Vitamin) .... Take 1 Tablet By Mouth Once A Day 5)  Furosemide 40 Mg  Tabs (Furosemide) .... Take 1 Tablet By Mouth Once A Day 6)  Spiriva Handihaler 18 Mcg Caps (Tiotropium Bromide Monohydrate) .... Inhale Contents of One Capsule Daily 7)  Ventolin Hfa 108 (90 Base) Mcg/act  Aers (Albuterol Sulfate) .... Inhale 2 Puffs Four Times A Day As Needed 8)  Klor-Con 10 10 Meq Cr-Tabs (Potassium Chloride) .... Take One Tablet By Mouth Once Daily. 9)  Naprosyn 500 Mg Tabs (Naproxen) .... Take 1 Tablet By Mouth Two Times A Day As Needed For Severe Pain 10)  Robaxin 500 Mg Tabs (Methocarbamol) .... Take 1 Tablet By Mouth No More Than Three Times A Day  As Needed For Pain or Spasm in Back  Allergies (verified): No Known Drug Allergies  Past History:  Past Medical History: COPD, FEV1/FVC -> 0.52 (69% of predicted) Aortic stenosis, s/p bioprosthetic valve replacement Osteoarthritis cardiomyopathy dx in 02-21-2005 with EF 30%   a. f/u echo in 2008-02-22:  EF 60%   b. has h/o CHF Tobacco abuse  Past Surgical History: Aortic Valve Replacement: bioprosthetic, 01/2006   a.  cannot find record of surgery (has scar) Tonsillectomy  Family History: Reviewed history from 06/21/2010 and no changes required. Cancer - bro (neck/throat cancer)   Mother-deceased 2011-05-22uterine cancer Father-deceased, CHF, CAD  2 sisters-alive and healthy 1 brother-deceased, throat cancer  Social History: Reviewed history from 06/21/2010 and no changes required. disabled Curator (COPD; arthritis) Divorced, 2 children Current Smoker-4 cigarettes per day (2-3 ppd for 50 years) Alcohol use-no   a. states he quit many years ago Drug use-no   a.  previous h/o cocaine use . . denies use since 02-21-2005  Review of Systems       The patient complains of shortness of breath.  The patient denies fatigue, malaise, fever, weight gain/loss, vision loss, decreased hearing, hoarseness, chest pain, palpitations, prolonged cough, wheezing, sleep apnea, coughing up blood, abdominal pain, blood in stool, nausea, vomiting, diarrhea, heartburn, incontinence, blood in urine, muscle weakness, joint pain, leg swelling, rash, skin lesions, headache, fainting,  dizziness, depression, anxiety, enlarged lymph nodes, easy bruising or bleeding, and environmental allergies.    Vital Signs:  Patient profile:   65 year old male Height:      68 inches Weight:      131.12 pounds BMI:     20.01 Pulse rate:   62 / minute Pulse rhythm:   irregular BP sitting:   130 / 90  (left arm) Cuff size:   large  Vitals Entered By: Danielle Rankin, CMA (July 12, 2010 10:25 AM)  Physical Exam  General:   General: Thin male, NAD HEENT: OP clear, mucus membranes moist SKIN: warm, dry Neuro: No focal deficits Musculoskeletal: Muscle strength 5/5 all ext Psychiatric: Mood and affect normal Neck: No JVD, no carotid bruits, no thyromegaly, no lymphadenopathy. Lungs:Clear bilaterally, no wheezes, rhonci, crackles CV: RRR no murmurs, gallops rubs Abdomen: soft, NT, ND, BS present Extremities: No edema, pulses 2+.    Echocardiogram  Procedure date:  12/07/2007  Findings:      -  Overall left ventricular systolic function was normal. Left         ventricular ejection fraction was estimated to be 60 %. There         was no diagnostic evidence of left ventricular regional wall         motion abnormalities. Doppler parameters were consistent with         abnormal left ventricular relaxation.   -  There is a tissue aortic prosthesis. There is mild AI that may be         perivalvular. It appears that the aorta is mildly dilated,         and it may be calcified at the base of the aortic root. There         was a bioprosthetic aortic valve. There was mild aortic         valvular regurgitation. There was no significant perivalvular         aortic regurgitation. The mean transaortic valve gradient was         16 mmHg.   -  There was mild aortic root dilatation. There was mild ascending         aortic dilatation.   -  The left atrium was mildly dilated.  EKG  Procedure date:  07/12/2010  Findings:      Sinus rhythm, rate 62 bpm. 1st degree AV block. IVCD. Diffuse T wave inversions.   Impression & Recommendations:  Problem # 1:  AORTIC VALVE REPLACEMENT, HX OF (ICD-V43.3) He is s/p bioprosthetic AVR in April 2007. He has been doing well from a cardiac standpoint. Will repeat echo to assess valve and LVEF. No changes in medications.    Orders: Echocardiogram (Echo)  Problem # 2:  TOBACCO ABUSE (ICD-305.1) Smoking cessation counseling provided. Pt is trying to stop.   Other  Orders: EKG w/ Interpretation (93000)  Patient Instructions: 1)  Your physician recommends that you schedule a follow-up appointment in: 6 months 2)  Your physician recommends that you continue on your current medications as directed. Please refer to the Current Medication list given to you today. 3)  Your physician has requested that you have an echocardiogram.  Echocardiography is a painless test that uses sound waves to create images of your heart. It provides your doctor with information about the size and shape of your heart and how well your heart's chambers and valves are working.  This procedure takes approximately one hour. There are no restrictions  for this procedure.

## 2010-11-02 NOTE — Assessment & Plan Note (Signed)
Summary: NP: s/p AVR; COPD; back pain   Vital Signs:  Patient profile:   65 year old male Height:      68 inches Weight:      132.5 pounds BMI:     20.22 Temp:     97.6 degrees F oral Pulse rate:   72 / minute Pulse rhythm:   regular Resp:     18 per minute BP sitting:   136 / 88  (left arm) Cuff size:   regular  Vitals Entered By: CMA Student Linzie Collin CC: New Patient visit, current back pain associated with knumbness, referral  for vision Is Patient Diabetic? No Pain Assessment Patient in pain? yes     Location: back Intensity: 7 Type: aching  Does patient need assistance? Functional Status Self care Ambulation Normal   Primary Care Provider:  Tereso Newcomer, PA-C  CC:  New Patient visit, current back pain associated with knumbness, and referral  for vision.  History of Present Illness: 65 year old male presents to the patient.  He has a history of severe aortic stenosis status post aortic valve replacement in 2007.  I cannot find a record of a cardiac catheterization or an operative note.  However, his notes from his cardiologist noted he did have the surgery and he does have a scar on his chest.  He's really not seen his cardiologist since 2009.  Apparently his bisoprolol and Altace were both discontinued at one point.  He had a followup echocardiogram in 2000 and demonstrated normal LV function.  Prior to his surgery, he had severe LV dysfunction and congestive heart failure.  He continues to take his furosemide.  He denies chest pain.  He denies syncope.  He denies orthopnea.  He does have shortness of breath with exertion.  This is related to his COPD.  He is currently on Spiriva and Proventil as needed.  He's been using oxygen with ambulation over the last several years.  He needs a renewal for  home health.  He complains of significant thoracic back pain.  This has been present for years.  He is disabled from being a Curator.  He also has arthritic pain in his  right foot.  This is related to a crush injury in the past.  He denies any radicular symptoms.  He takes Goody's powders several times a day.  Of note, the patient has a history of prior substance abuse with cocaine.  He is currently not asking for any narcotics.  Habits & Providers  Alcohol-Tobacco-Diet     Tobacco Status: current  Exercise-Depression-Behavior     Drug Use: no  Current Medications (verified): 1)  Aspirin 325 Mg Tabs (Aspirin) .... Take 1 Tablet By Mouth Once A Day 2)  Zebeta 5 Mg Tabs (Bisoprolol Fumarate) .... Take 1/2 Tablet By Mouth Once A Day 3)  Lovastatin 20 Mg Tabs (Lovastatin) .... Take 1 Tablet By Mouth Once A Day 4)  Plavix 75 Mg Tabs (Clopidogrel Bisulfate) .... Take 1 Tablet By Mouth Once A Day 5)  Multivitamins  Tabs (Multiple Vitamin) .... Take 1 Tablet By Mouth Once A Day 6)  Furosemide 40 Mg  Tabs (Furosemide) .... Take 1 Tablet By Mouth Once A Day 7)  Potassium Chloride 20 Meq Pack (Potassium Chloride) .... Take 1 Tablet By Mouth Once A Day 8)  Spiriva Handihaler 18 Mcg Caps (Tiotropium Bromide Monohydrate) .... Inhale Contents of One Capsule Daily 9)  Ventolin Hfa 108 (90 Base) Mcg/act  Aers (Albuterol Sulfate) .... Inhale  2 Puffs Four Times A Day As Needed 10)  Advair Diskus 100-50 Mcg/dose  Misc (Fluticasone-Salmeterol) .... Inhale Two Times A Day 11)  Altace 2.5 Mg Caps (Ramipril) 12)  Klor-Con 10 10 Meq Cr-Tabs (Potassium Chloride) .... Take One Tablet By Mouth Once Daily.  Allergies (verified): No Known Drug Allergies  Past History:  Past Medical History: COPD, FEV1/FVC -> 0.52 (69% of predicted) Aortic stenosis, s/p bioprosthetic valve replacement Osteoarthritis cardiomyopathy dx in 2006 with EF 30%   a. f/u echo in 2009:  EF 60%   b. has h/o CHF  Past Surgical History: Aortic Valve Replacement: bioprosthetic, 01/2006   a.  cannot find record of surgery (has scar)  Family History: Cancer - bro (neck/throat cancer) Uterine CA -  mom  Social History: disabled Curator (COPD; arthritis) Divorced Current Smoker Alcohol use-no   a. states he quit many years ago Drug use-no   a.  previous h/o cocaine use . . denies use since 2006  Smoking Status:  current Drug Use:  no  Physical Exam  General:  alert, well-developed, and well-nourished.   Head:  normocephalic and atraumatic.   Neck:  supple.  no jvd  Lungs:  normal breath sounds, no crackles, and no wheezes.   Heart:  normal rate, regular rhythm, no murmur, and no gallop.   Abdomen:  soft, non-tender, and no hepatomegaly.   Msk:  + kyphosis paraspinal muscles tight in thoracic region neg SLR  Extremities:  no edema  Neurologic:  BUE and BLE strength normal and equal DTRs normal bilat  Psych:  normally interactive.     Impression & Recommendations:  Problem # 1:  COPD (ICD-496)  still using O2 from his surgery in 2007 but, uses O2 when he ambulates due to DOE from COPD renew home health referral  The following medications were removed from the medication list:    Advair Diskus 100-50 Mcg/dose Misc (Fluticasone-salmeterol) ..... Inhale two times a day His updated medication list for this problem includes:    Spiriva Handihaler 18 Mcg Caps (Tiotropium bromide monohydrate) ..... Inhale contents of one capsule daily    Ventolin Hfa 108 (90 Base) Mcg/act Aers (Albuterol sulfate) ..... Inhale 2 puffs four times a day as needed  Orders: Home Health Referral (Home Health)  Problem # 2:  AORTIC VALVE REPLACEMENT, HX OF (ICD-V43.3)  f/u echo f/u with card  Orders: Cardiology Referral (Cardiology) 2 D Echo (2 D Echo)  Problem # 3:  CARDIOMYOPATHY (ICD-425.4)  EF recovered by echo in 2009 pt not taking beta blocker or ACE in 2 years no symptoms of CHF or signs  repeat echo . . . if EF still normal, prob continue off ACE and beta blocker  not sure why on plavix and ASA cannot find record of cath prior to AVR could prob d/x plavix and stay  on ASA f/u with card  check labs . . .not so sure he needs lasix; if prerenal, will try to stop  Orders: T-Comprehensive Metabolic Panel ([id number]) T-CBC w/Diff (33295-18841) Cardiology Referral (Cardiology) 2 D Echo (2 D Echo)  Problem # 4:  BACK PAIN, THORACIC REGION (ICD-724.1)  has kyphosis check xray no narcotics with h/o drug abuse advised him to avoid Goody's powders tylenol as needed naproxen as needed for severe pain muscle relaxer send to PT if xray ok  His updated medication list for this problem includes:    Aspirin 325 Mg Tabs (Aspirin) .Marland Kitchen... Take 1 tablet by mouth once a day  Naprosyn 500 Mg Tabs (Naproxen) .Marland Kitchen... Take 1 tablet by mouth two times a day as needed for severe pain    Robaxin 500 Mg Tabs (Methocarbamol) .Marland Kitchen... Take 1 tablet by mouth no more than three times a day as needed for pain or spasm in back  Orders: Diagnostic X-Ray/Fluoroscopy (Diagnostic X-Ray/Flu) T-Drug Screen-Urine, (single) ([id number])  Complete Medication List: 1)  Aspirin 325 Mg Tabs (Aspirin) .... Take 1 tablet by mouth once a day 2)  Lovastatin 20 Mg Tabs (Lovastatin) .... Take 1 tablet by mouth once a day 3)  Plavix 75 Mg Tabs (Clopidogrel bisulfate) .... Take 1 tablet by mouth once a day 4)  Multivitamins Tabs (Multiple vitamin) .... Take 1 tablet by mouth once a day 5)  Furosemide 40 Mg Tabs (Furosemide) .... Take 1 tablet by mouth once a day 6)  Spiriva Handihaler 18 Mcg Caps (Tiotropium bromide monohydrate) .... Inhale contents of one capsule daily 7)  Ventolin Hfa 108 (90 Base) Mcg/act Aers (Albuterol sulfate) .... Inhale 2 puffs four times a day as needed 8)  Klor-con 10 10 Meq Cr-tabs (Potassium chloride) .... Take one tablet by mouth once daily. 9)  Naprosyn 500 Mg Tabs (Naproxen) .... Take 1 tablet by mouth two times a day as needed for severe pain 10)  Robaxin 500 Mg Tabs (Methocarbamol) .... Take 1 tablet by mouth no more than three times a day as needed for pain  or spasm in back  Other Orders: T-Culture, Urine ([id number]) T-Lipid Profile ([id number]) UA Dipstick w/o Micro (manual) (11941)  Patient Instructions: 1)  Take tylenol (acetaminophen) 500 mg 1-2 tabs every 6 hours as needed for pain. 2)  Take the Naprosyn with food sparingly only as needed for severe pain. 3)  Use the muscle relaxer sparingly only as needed for severe pain. 4)  Get the xray done. 5)  Someone will call you about home health and cardiology referrals. 6)  Schedule a CPE in the next 1-2 months. Prescriptions: KLOR-CON 10 10 MEQ CR-TABS (POTASSIUM CHLORIDE) Take one tablet by mouth once daily.  #30 Tablet x 3   Entered and Authorized by:   Tereso Newcomer PA-C   Signed by:   Tereso Newcomer PA-C on 06/21/2010   Method used:   Print then Give to Patient   RxID:   [id number] VENTOLIN HFA 108 (90 BASE) MCG/ACT  AERS (ALBUTEROL SULFATE) Inhale 2 puffs four times a day as needed  #1 x 5   Entered and Authorized by:   Tereso Newcomer PA-C   Signed by:   Tereso Newcomer PA-C on 06/21/2010   Method used:   Print then Give to Patient   RxID:   [id number] ROBAXIN 500 MG TABS (METHOCARBAMOL) Take 1 tablet by mouth no more than three times a day as needed for pain or spasm in back  #30 per month x 0   Entered and Authorized by:   Tereso Newcomer PA-C   Signed by:   Tereso Newcomer PA-C on 06/21/2010   Method used:   Print then Give to Patient   RxID:   [id number] NAPROSYN 500 MG TABS (NAPROXEN) Take 1 tablet by mouth two times a day as needed for severe pain  #30 x 0   Entered and Authorized by:   Tereso Newcomer PA-C   Signed by:   Tereso Newcomer PA-C on 06/21/2010   Method used:   Print then Give to Patient   RxID:   [id number]   Laboratory Results   Urine Tests  Date/Time Received: June 21, 2010 11:30 AM   Routine Urinalysis   Glucose: negative   (Normal Range: Negative) Bilirubin: negative   (Normal Range: Negative) Ketone: negative   (Normal Range:  Negative) Spec. Gravity: 1.020   (Normal Range: 1.003-1.035) Blood: negative   (Normal Range: Negative) pH: 5.5   (Normal Range: 5.0-8.0) Protein: negative   (Normal Range: Negative) Urobilinogen: 0.2   (Normal Range: 0-1) Nitrite: negative   (Normal Range: Negative) Leukocyte Esterace: trace   (Normal Range: Negative)

## 2010-11-04 NOTE — Progress Notes (Signed)
Summary: Lost his inhaler  Phone Note Call from Patient   Summary of Call: LOST HIS INHALER//VENTOLIN//BEEN WITHOUT IT SINCE YESTERDAY//HAVING A REAL HARD TIME BREATHING/////[facility] [address]///[phone] Initial call taken by: Arta Bruce,  September 15, 2010 9:28 AM  Follow-up for Phone Call        PT WASN'T SURE OF HIS PHONE # BUT ON OUR SYSTEM IT IS [phone] Follow-up by: Arta Bruce,  September 15, 2010 9:30 AM  Additional Follow-up for Phone Call Additional follow up Details #1::        MEDICARE IS NOT GOING TO COVER THIS REFILL BECAUSE IT IS TO EARLY//DO WE HAVE ANY SAMPLES? Additional Follow-up by: Arta Bruce,  September 15, 2010 9:37 AM    Additional Follow-up for Phone Call Additional follow up Details #2::    Pt. found his inhaler -- no need for replacement.  Dutch Quint RN  September 15, 2010 3:22 PM

## 2010-11-12 ENCOUNTER — Telehealth (INDEPENDENT_AMBULATORY_CARE_PROVIDER_SITE_OTHER): Payer: Self-pay | Admitting: Nurse Practitioner

## 2010-11-18 NOTE — Progress Notes (Signed)
Summary: Query:  Refill Ventolin?  Phone Note Outgoing Call   Summary of Call: Last seen 06/2010.  Refill Ventolin per protocol? Initial call taken by: Dutch Quint RN,  November 12, 2010 5:12 PM  Follow-up for Phone Call        ok to refill Follow-up by: Lehman Prom FNP,  November 12, 2010 5:20 PM  Additional Follow-up for Phone Call Additional follow up Details #1::        Noted.  Refill completed.  Dutch Quint RN  November 12, 2010 5:24 PM     Prescriptions: VENTOLIN HFA 108 (90 BASE) MCG/ACT  AERS (ALBUTEROL SULFATE) Inhale 2 puffs four times a day as needed  #1 x 3   Entered by:   Dutch Quint RN   Authorized by:   Lehman Prom FNP   Signed by:   Dutch Quint RN on 11/12/2010   Method used:   Electronically to        [facility] [address] [id number] (retail)       [address] [place], [place]  [address]       Ph: [phone]       Fax: [phone]   RxID:   [id number]

## 2010-12-21 ENCOUNTER — Telehealth (INDEPENDENT_AMBULATORY_CARE_PROVIDER_SITE_OTHER): Payer: Self-pay | Admitting: Nurse Practitioner

## 2010-12-23 ENCOUNTER — Other Ambulatory Visit: Payer: Self-pay | Admitting: *Deleted

## 2010-12-23 DIAGNOSIS — J449 Chronic obstructive pulmonary disease, unspecified: Secondary | ICD-10-CM

## 2010-12-23 MED ORDER — ALBUTEROL 90 MCG/ACT IN AERS: 2.0000 | INHALATION_SPRAY | RESPIRATORY_TRACT | Status: DC

## 2010-12-30 NOTE — Progress Notes (Signed)
Summary: Query:  Refill spiriva?  Phone Note Outgoing Call   Summary of Call:  Last seen 06/2010, no f/u appt.  Refill Spiriva per protocol or refer pharmacy to [facility]?  Has not been refilled by this office, was done by [facility].   Initial call taken by: Dutch Quint RN,  December 21, 2010 4:06 PM  Follow-up for Phone Call        He had an appt today and no showed but ok to refill Follow-up by: Lehman Prom FNP,  December 21, 2010 4:10 PM

## 2011-02-01 ENCOUNTER — Other Ambulatory Visit: Payer: Self-pay | Admitting: Cardiovascular Disease

## 2011-02-15 NOTE — Assessment & Plan Note (Signed)
[facility]                            CARDIOLOGY OFFICE NOTE   JERRIAN, MELLS                      MRN:          [id number]  DATE:12/07/2007                            DOB:          1945/12/09    PRIMARY CARE PHYSICIAN:  [facility]   REASON FOR VISIT:  Routine follow-up.   HISTORY OF PRESENT ILLNESS:  Mr. Galvan comes in for a 76-month visit.  He  states he is doing well.  He has had no orthopnea or lower extremity  edema.  He states he was able walk a mile and a half the other day  without stopping.  He is not having any angina or breathlessness beyond  NYHA class II.  Today's electrocardiogram shows sinus rhythm with an  intraventricular conduction delay as noted previously and leftward axis.  He just underwent an echocardiogram earlier this morning, report  pending, to follow up on his aortic valve and ejection fraction.  He is  tolerating the present medical regimen.  We talked about rechallenging  with a low-dose ACE inhibitor, although in the past, he had had trouble  with hypotension on this.  He is not reporting any dizziness or  palpitations.   ALLERGIES:  No known drug allergies.   PRESENT MEDICATIONS:  1. Lovastatin 20 mg p.o. daily.  2. Plavix 75 mg p.o. daily.  3. Multivitamin daily.  4. Potassium chloride 10 mEq p.o. daily.  5. Advair 100/50 two puffs b.i.d.  6. Albuterol p.r.n.  7. Bisoprolol 5 mg one half tablet p.o. daily.  8. Ventolin as needed.  9. Lasix 40 mg p.o. daily.   REVIEW OF SYSTEMS:  As outlined above.  Otherwise negative.   PHYSICAL EXAMINATION:  VITAL SIGNS:  Blood pressure 127/67, heart rate  is 57, weight 152 pounds which is stable.  GENERAL APPEARANCE:  The patient is comfortable in no acute distress.  NECK:  No elevated jugular venous pressure, no loud bruits.  LUNGS:  Clear without labored breathing.  CARDIAC:  Exam reveals a regular rate and rhythm.  Soft systolic murmur  at the  base.  No S3 gallop.  Second heart sound is  preserved.  EXTREMITIES:  No pitting edema.   IMPRESSION/RECOMMENDATIONS:  History of critical aortic stenosis with  left ventricular dysfunction, status post bioprosthetic aortic valve  replacement in April 2007.  Will follow up on echocardiogram done  earlier today.  We may try to rechallenge him with a low-dose ACE  inhibitor for more optimal medical therapy depending on his blood  pressure response.  Otherwise, he will come back for routine visit in 6  months.     Jonelle Sidle, MD  Electronically Signed    SGM/MedQ  DD: 12/07/2007  DT: 12/09/2007  Job #: [id number]

## 2011-02-15 NOTE — Assessment & Plan Note (Signed)
[facility]                            CARDIOLOGY OFFICE NOTE   DEVIN, GANAWAY                      MRN:          [id number]  DATE:06/11/2007                            DOB:          [birth year]    PRIMARY CARE PHYSICIAN:  [facility].   REASON FOR VISIT:  Cardiology followup.   HISTORY OF PRESENT ILLNESS:  Mr. Asman was seen in March.  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  He has  been relatively stable symptomatically with occasional lower extremity  edema but no marked dyspnea on exertion or chest discomfort.  Electrocardiogram today shows sinus rhythm with a prolonged PR interval  and incomplete left bundle branch block pattern which has been noted  previously.  He is tolerating his medicines relatively well.  He has had  no bleeding problems on Plavix.  He is due to establish with a new  Cherry [place] at the [facility] later this month.   ALLERGIES:  No known drug allergies.   MEDICATIONS:  1. Zebeta 2.5 mg p.o. daily.  2. Lovastatin 20 mg p.o. daily.  3. Plavix 75 mg p.o. daily.  4. Multivitamins 1 p.o. daily.  5. Potassium 10 mEq p.o. daily.  6. Spiriva 18 mcg daily.  7. Lasix 40 mg p.o. daily.  8. Ventolin 2 puffs 4 times a day.  9. Tylenol p.r.n.   REVIEW OF SYSTEMS:  As described in History of Present Illness.   PHYSICAL EXAMINATION:  VITAL SIGNS:  Blood pressure 119/80, heart rate  60.  Weight is 152 pounds.  GENERAL:  The patient is comfortable, in no acute distress.  NECK:  Reveals no elevated jugular venous pressure.  LUNGS:  Clear without increased work of breathing.  CARDIAC:  Regular rate and rhythm.  Soft systolic murmur at the base,  preserved S2.  No S3 or gallop.  EXTREMITIES:  Exhibit no pitting edema today.   IMPRESSION AND RECOMMENDATIONS:  1. History of critical aortic  stenosis with left ventricular      dysfunction status post bioprosthetic aortic valve replacement in      April 2007.  Overall, he is symptomatically stable on the present      medical regimen.  At one point, we had him on an ACE inhibitor,      although he has fairly low blood pressure at baseline, had      manifested some dizziness in the past. It may be worth a retrial of      a low-dose ACE inhibitor going forward.  I will plan to continue      his present regimen for the time being, however, and plan to see      him back in the next 6 months.  We will plan a followup      echocardiogram around that time to reassess left ventricular      function and proceed from there.  2. Further plans to follow.     Jonelle Sidle, MD  Electronically Signed  SGM/MedQ  DD: 06/11/2007  DT: 06/11/2007  Job #: [id number]   cc:   [facility]

## 2011-02-18 NOTE — Assessment & Plan Note (Signed)
[facility]                            CARDIOLOGY OFFICE NOTE   LETICIA, MCDIARMID                      MRN:          [id number]  DATE:12/29/2006                            DOB:          1946-03-25    PRIMARY CARE:  [facility].   REASON FOR VISIT:  Cardiac followup.   HISTORY OF PRESENT ILLNESS:  I saw Mr. Madlock back in October.  He denies  any significant chest pain or limiting dyspnea on exertion.  His main  complaint is musculoskeletal-sounding left arm and shoulder pain, and  the inability to raise his left arm above shoulder level.  He seems to  relate this to a time when he was doing some work on a car and may have  injured his shoulder.  His electrocardiogram today shows sinus  bradycardia at 57 beats per minute with inferolateral ST-T wave changes  as noted previously, and poor interior R-wave progression.  He is not  having any orthopnea, PND, or significant lower extremity edema.  He has  gained weight, but states that he has been eating more food.  I reviewed  his medications today.  He states that he has been compliant with this.  He has not had a recent lipid profile.   ALLERGIES:  No known drug allergies.   PRESENT MEDICATIONS:  1. Zebeta 2.5 mg p.o. daily.  2. Lovastatin 20 mg p.o. daily.  3. Plavix 75 mg p.o. daily.  4. Multivitamin 1 p.o. daily.  5. Potassium 20 mEq p.o. daily.  6. Spiriva inhaler.  7. Lasix 40 mg p.o. daily.  8. Ventolin inhaler.   REVIEW OF SYSTEMS:  As described in the history of present illness.   PHYSICAL EXAMINATION:  Blood pressure is 110/70, heart rate 57, weight  is 160 pounds, up from 146.  Patient is comfortable and in no acute distress.  Examination of the neck reveals no loud bruits or elevated jugular  venous pressure.  LUNGS:  Generally clear.  CARDIAC EXAM:  Reveals a regular rate and rhythm with 3/6 systolic  murmur radiating towards the apex.  No S3 gallop.   EXTREMITIES:  Exhibit no pitting edema.   IMPRESSION/RECOMMENDATIONS:  1. History of cortical aortic stenosis with severe left ventricular      dysfunction, now status post bioprosthetic aortic valve      replacement.  We will plan to continue the medical regimen outlined      above, and I will plan a followup lipid profile, liver function      test to reassess his lipid status.  Otherwise, we will plan to see      him back over the next 6 months for symptom review.  2. Regarding his left arm pain, I have asked him to followup with his      primary care Samer Dutton as he may need additional imaging studies and      therapy in this regard.     Jonelle Sidle, MD  Electronically Signed    SGM/MedQ  DD: 12/29/2006  DT: 12/29/2006  Job #: [id number]

## 2011-02-18 NOTE — Discharge Summary (Signed)
NAME:  IVEN, EARNHART NO.:  [id number]   MEDICAL RECORD NO.:  [id number]          PATIENT TYPE:  INP   LOCATION:  3735                         FACILITY:  [facility]   PHYSICIAN:  Ellie Lunch, M.D.      DATE OF BIRTH:  12/27/1945   DATE OF ADMISSION:  11/23/2004  DATE OF DISCHARGE:                                 DISCHARGE SUMMARY   Audio too short to transcribe (less than 5 seconds)      BP/MEDQ  D:  11/26/2004  T:  11/26/2004  Job:  [id number]

## 2011-02-18 NOTE — Consult Note (Signed)
NAME:  [name] [name], [name] [name] NO.:  [id number]   MEDICAL RECORD NO.:  [id number]          PATIENT TYPE:  INP   LOCATION:  2915                         FACILITY:  [facility]   PHYSICIAN:  Salvatore Decent. Dorris Fetch, M.D.DATE OF BIRTH:  17-May-1946   DATE OF CONSULTATION:  01/12/2006  DATE OF DISCHARGE:                                   CONSULTATION   REASON FOR CONSULTATION:  Critical aortic stenosis.   HISTORY OF PRESENT ILLNESS:  [name] [name] is a 65 year old gentleman who was  admitted on January 08, 2006, presenting with complaint of cough, fevers,  chills, weakness, and vague chest pain.  He was admitted with congestive  heart failure in shock, I initially felt possibly due to sepsis, given his  fever, but his fever resolved.  He had a positive urinary tox screen for  cocaine which was felt to possibly be the fever source.  He did have some  question of infiltrates on a CT done on admission.  There was no evidence of  an aortic dissection.  The patient has a known history of severe aortic  stenosis and moderate aortic insufficiency, as well as congestive heart  failure.  He was seen in consultation by Dr. Nona Dell.  Repeat  echocardiogram showed worsening of his aortic stenosis, with a mean gradient  of approximately 50 and a peak gradient of greater than 80.  He also had  significant deterioration of his left ventricular function, going from 30-  40% previously to 20-25% currently.  He had left ventricular hypertrophy and  dilatation.  He also had moderate aortic regurgitation.  The patient  responded to treatment with antibiotics and diuretics.  Today, he underwent  cardiac catheterization, where he was found to have a 50% right coronary  stenosis.  He had right atrial pressure of 2, PA pressures of 34/14.  His  wedge pressure was 11.  His cardiac index was approximately 2 on dopamine at  5 mcg/kg/minute.  He was also relatively hypotensive.  The patient currently  states  that he feels much better than on admission.  He is able to lie on  his back for the first time in several months, and he is not currently  having an respiratory difficulty.   PAST MEDICAL HISTORY:  1.  Aortic stenosis.  2.  Congestive heart failure.  3.  Heavy tobacco abuse.  4.  COPD.  5.  History of polysubstance abuse.   MEDICATIONS ON ADMISSION:  1.  Bisoprolol and Lasix.  2.  He currently is on dopamine.  3.  Heparin.  4.  Albuterol.  5.  Atrovent.  6.  Protonix.  7.  Sliding scale insulin.  8.  Prednisone 60 mg daily.  9.  Avelox 400 mg daily.  10. Amiodarone.   He has no known drug allergies.   FAMILY HISTORY:  No history of premature cardiac disease.   SOCIAL HISTORY:  He is disabled.  He lives with his mother.  He has a long  history of 2-3 packs per day smoking history, but says that he quit last  year.  He  had a positive tox screen for cocaine on admission.   REVIEW OF SYSTEMS:  Orthopnea, paroxysmal nocturnal dyspnea.  No lower  extremity edema.  Denies any renal or hepatic problems.  No history of  ulcers.  No recent change in bowel or bladder habits.  Does get short of  breath with exertion.  All other systems are negative.   PHYSICAL EXAMINATION:  GENERAL:  [name] [name] is a 65 year old thin white male  in no acute distress.  VITAL SIGNS:  Blood pressure is 110/66; pulse is 102 and regular;  respirations are 20; oxygen saturation is 94% on 2 liters nasal cannula.  NEUROLOGIC:  He is alert and oriented x3.  He is appropriate and grossly  intact.  HEENT:  Remarkable for poor dentition, with loose and decaying teeth.  CARDIAC:  Has a regular rate and rhythm.  He has a high-pitched harsh  crescendo-decrescendo murmur heard throughout the precordium.  There are no  carotid bruits.  There is no jugular venous distention.  LUNGS:  Have course breath sounds bilaterally.  There is scant wheezing in  the right base.  ABDOMEN:  Soft, nontender.  EXTREMITIES:   Without clubbing, cyanosis, or edema.  He has 2+ distal  pulses.  SKIN:  Warm and dry.   LABORATORY DATA:  Blood cultures are negative.  BNP level on January 11, 2006  was 1,028.  His white count is 6.5, hematocrit 38, platelets 145.  PT 14.8,  PTT 37.  Sodium is 142, potassium 3.8, CO2 29, glucose 92, BUN and  creatinine 17 and 0.9.  His troponin was elevated at 0.13.  Albumin was 3.1.  LFTs were within normal limits.  Tox screen was positive for cocaine.  TSH  was 1.054.  Cortisol was within normal limits.  Chest CT showed emphysema,  cardiomegaly.  His ascending aorta was 3.9 cm.  There were bilateral small  pleural effusions, with compressive atelectasis and air space consolidation  of the right lower lobe.  EKG showed accelerated junctional rhythm on January 09, 2006.  No acute ischemic changes.   IMPRESSION:  [name] [name] is a 65 year old gentleman with critical aortic  stenosis.  His echocardiogram revealed a peak gradient of 81 mmHg and a mean  gradient of 53 mmHg.  The valve could not be crossed at the time of  catheterization.  It is heavily calcified and nonmobile.  He also has  moderate aortic regurgitation.  He presented with fevers, cough, and  shortness of breath.  There is a question that the fever may be related to  cocaine use rather than infectious etiology, although he has been on  antibiotics since admission, initially with Rocephin and Zithromax, and  currently on Avelox.  He has also been treated with steroids, which could  potentially suppress a fever.  However, it appears that his main problem was  congestive heart failure secondary to his severe aortic stenosis.  Aortic  valve replacement is indicated for survival benefit in the setting of  critical aortic stenosis, although it is at relatively high risk in his  case.  The risk of surgery is far outweighed by his risk of death or  continued deterioration if the valve is not replaced.  I discussed in detail with Mr.  Deupree the indications, risks, benefits, and alternatives.  He  understands that the risks of surgery include but are not limited to death,  approximately 5%; stroke, approximately 5%; bleeding, possible need for  transfusions, infections, as well as other  organ system dysfunction,  including respiratory, renal, or GI complications, as well as risk of heart  block requiring pacemaker placement.  He understands these risks and is  agreeable to proceeding with aortic valve replacement.   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  It is borderline at 3.9 cm and  will have to be inspected intraoperatively to make a final determination of  whether the ascending aorta needs to be replaced at the time of his valve  replacement.  I think given his severe left ventricular dysfunction, the  preference would be to try to keep the operation as simple as possible if it  is safe for him from the standpoint of his long-term risk.   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  I did discuss this  issue with him.  This is at a site that could be angioplastied in the future  should it need to be.  However, given his history of noncompliance, he is  not a good candidate for Plavix, and that would limit the options regarding  stenting.  It is relatively little additional risk and little additional  time to bypass that with a vein graft at the time of his aortic valve  replacement.  The patient favors that approach.   I also discussed valve options with him.  Again, given his history of  noncompliance, he is not a candidate for Coumadin.  Therefore, mechanical  valve is not an option in his case.  Both his short- and long-term risks are  lower with a tissue valve.  Although there is a risk of structural  deterioration over time, his risk of a redo aortic valve replacement is less  than his risk for major bleeding complications  with noncompliance of  Coumadin.   The final issue is that Mr. Lipson has very poor dentition.  He has loose and  decaying teeth.  He needs a dental consult, as he has not seen a dentist in  years.  Will ask Dr. Kristin Bruins to see the patient, and he will likely need  multiple extractions.  Once that issue is resolved, we will plan to proceed  with aortic valve replacement hopefully early next week.           ______________________________  Salvatore Decent. Dorris Fetch, M.D.     SCH/MEDQ  D:  01/12/2006  T:  01/12/2006  Job:  [id number]   cc:   Jonelle Sidle, M.D. [facility]  [address]  [place]  [place] [address]   Hollice Espy, M.D.
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
## 2011-02-18 NOTE — Discharge Summary (Signed)
NAME:  [name] [name], [name] [name] NO.:  [id number]   MEDICAL RECORD NO.:  [id number]          PATIENT TYPE:  INP   LOCATION:  3735                         FACILITY:  [facility]   PHYSICIAN:  Ellie Lunch, M.D.      DATE OF BIRTH:  10/26/1945   DATE OF ADMISSION:  11/23/2004  DATE OF DISCHARGE:  11/26/2004                                 DISCHARGE SUMMARY   CONSULTING PHYSICIAN:  [name] Sidle, MD, [facility].   DISCHARGE DIAGNOSES:  1.  Newly diagnosed congestive heart failure with ejection fraction of 20 to      30%.  2.  Critical aortic stenosis.  3.  Chronic obstructive pulmonary disease.   DISCHARGE MEDICATIONS:  1.  Aspirin 325 mg p.o. daily.  2.  Lasix 20 mg p.o. daily.  3.  K-Dur 20 mEq p.o. daily.  4.  Spiriva inhaler once a day.  5.  Albuterol one to two puffs q.i.d. p.r.n.   DISPOSITION FOLLOWUP:  Patient will follow up in the outpatient clinic with  Dr. Shannan Harper on March 2nd at 2 p.m.  A BMET will be obtained to follow up  on the potassium.  Patient will also follow up with Dr. Nona Dell at  [facility] on December 09, 2004, at 12:15 p.m. to decide regarding  further management in terms of his AS.  Patient has also been advised to not  use tobacco or cocaine, and this can be assessed at each followup visit.   PROCEDURES PERFORMED:  Two-D echo was performed on November 24, 2004, with  the following result:  Left ventricular ejection fraction of 20-30%, severe,  diffuse, left ventricular hypokinesis, moderately increased left ventricular  wall thickness, mild focal basilar septal hypertrophy, calcification of the  aortic wall, moderate to severe aortic valve stenosis with estimated aortic  valve area of 1.72 sq cm, which may be an over-estimation.  Also, moderately  reduced right ventricular systolic function and a left pleural effusion.   ADMISSION HISTORY AND PHYSICAL:  [name] [name] is a 65 year old white man, with  no known past medical  history, who presented to the ED with progressive  dyspnea for four months, which was initially relieved with inhaler.  He also  frequently uses crack cocaine.  He developed orthopnea and chest pain, both  pleuritic and substernal, and a drowning sensation and was unable to  ambulate long distances, and thus decided to come to the emergency room.   ALLERGIES:  No known drug allergies.   SUBSTANCE ABUSE HISTORY:  Patient has used crack cocaine for the past 20  years.  He also is a chronic smoker and smokes half pack per day for the  past 20 years.  He used to drink alcohol, but quit eight years ago.  He is  single, lives with his mother, and is self-pay for medications.   FAMILY HISTORY:  Significant for congestive heart failure in patient's  father, who died in the 12s.  He has three siblings.  No clear history of  aortic valve disease in family or premature cardiovascular disease.   PHYSICAL EXAMINATION  ON ADMISSION:  VITAL SIGNS:  Pulse 88, blood pressure  110/74, temp was 97.3, respirations 13 to 40.  O2 SATs 95% on 1.5 liters.  GENERAL:  Patient is in mild distress secondary to difficulty breathing.  NECK:  Supple.  No JVD.  RESPIRATIONS:  Bilateral crackles to mid lungs, scattered wheezing, moderate  air entry.  CARDIOVASCULAR:  Regular rate and rhythm.  There is a 3/6 harsh systolic  murmur heard best at the base with radiation towards the right carotid and  significantly diminished second heart sound.  There is also a systolic  murmur best heard at the apex.  No S3 gallop or pericardial rub.  ABDOMEN:  Soft, nontender, nondistended. Positive bowel sounds.   ADMISSION LABORATORY DATA:  Sodium 141, potassium 4.2, chloride 108, bicarb  23, BUN 23, creatinine 1.7, glucose 100.  Hemoglobin 13.6, white count 9.3,  platelets 196.  Anion gap 15, Bili 0.5, alk-phos 41, SGOT 41, SGPT 25,  protein 5.6, albumin 3.4, calcium 8.6.  Chest x-ray shows bilateral  effusions and cardiomegaly.   EKG:  Left ventricular hypertrophy and poor R-  wave progression.   HOSPITAL COURSE:  1.  Newly diagnosed congestive heart failure.  Patient's chest x-ray on      admission was suggestive of failure.  Also, a BNP obtained was 1759,      which suggests failure.  Patient was aggressively diuresed initially.      Also, a two-D echogram was obtained with the findings indicated above,      especially notable for an ejection fraction of 20 to 30% and moderate to      critical aortic stenosis.  Since the patient had critical aortic      stenosis in the setting of heart failure, Cardiology consult was      obtained to assess the need for surgery since the patient is relatively      young.  Dr. Diona Browner from [facility] assessed the patient.  He      educated the patient regarding the nature of his illness and the need      for surgery; however, the patient needed more time to think about it and      thus is being discharged home with the followup appointments set with      him in 10 days, and per patient, he will reach a decision in 10 days.      He will also have a followup appointment set up in the clinic for a BMET      since the patient was newly started with Lasix, and the creatinine as      well as the potassium need to be monitored.  Finally, the patient has      been strongly advised not to use cocaine and also to quit smoking.      Patient is not being discharged on an ACE inhibitor because of critical      aortic stenosis and a beta block was not started because of history of      cocaine use.  2.  Chronic obstructive pulmonary disease.  A chest x-ray obtained to      evaluate failure showed chronic obstructive pulmonary disease.  Patient      also has a heavy smoking history.  He was started on Albuterol      nebulizers and also Spiriva during hospital admission.  He did not have      any exacerbations.  Also, a smoking cessation consult was  obtained, and     patient has been  advised to quit smoking.  He is being discharged home      on Spiriva as well as Albuterol MDI p.r.n.  PFTs can be obtained as an      outpatient.  3.  Cocaine use.  Patient has been advised not to use any cocaine in the      future.  Also, a case manager counseled the patient regarding ADS.   DISCHARGE LABORATORY DATA:  Sodium 135, potassium 3.9, chloride 105, bicarb  28, BUN 17, creatinine 1, glucose 100.  Hemoglobin 12.8, white count 8.1,  platelets 181.   DISCHARGE VITAL SIGNS:  Temp 98.5, pulse 82, blood pressure 122/76,  respirations 20.  O2 SATs 99% on room air and 97% on ambulation.      BP/MEDQ  D:  11/26/2004  T:  11/27/2004  Job:  [id number]   cc:   [name] [name], M.D. [facility]

## 2011-02-18 NOTE — Assessment & Plan Note (Signed)
[facility]                              CARDIOLOGY OFFICE NOTE   [name], [name]                      MRN:          [id number]  DATE:07/03/2006                            DOB:          Feb 08, 1946    PRIMARY CARE PHYSICIAN:  Dr. Okey Dupre at the [facility].   REASON FOR VISIT:  Cardiac followup.   HISTORY OF PRESENT ILLNESS:  I saw [name] [name] back in June.  He states that  overall, he continues to do well without any significant angina or limiting  dyspnea on exertion.  He has not been using his oxygen at home regularly and  states that his breathing has not been appreciably affected.  His  electrocardiogram today shows possibly an accelerated junctional rhythm at  88 beats per minute with similar ST-T wave changes.  He is not experiencing  any palpitations.  He does tell me that he has run out of his medicines as  of last week and is just now getting things refilled.  We talked about  continued regular use of medications and a basic walking regimen.   ALLERGIES:  No known drug allergies.   PRESENT MEDICATIONS:  1. Zebeta 2.5 mg p.o. daily.  2. Lovastatin 20 mg p.o. daily.  3. Plavix 75 mg p.o. daily.  4. Multivitamin 1 p.o. daily.  5. Potassium chloride 20 mEq p.o. daily.  6. Spiriva.  7. Lasix 40 mg p.o. daily.  8. Ventolin 2 puffs q.i.d. p.r.n.   REVIEW OF SYSTEMS:  As described in the history of present illness.   PHYSICAL EXAMINATION:  VITAL SIGNS:  Blood pressure is 120/78, heart rate is  88.  Weight is 146 pounds.  GENERAL:  Patient is in no acute distress.  NECK:  No elevated jugular venous pressure.  LUNGS:  Clear with diminished breath sounds.  No active wheezing.  CARDIAC:  Regular rate and rhythm with a soft systolic murmur at the base,  preserved S2.  No S3 gallop.  EXTREMITIES:  No pitting edema.   IMPRESSION/RECOMMENDATIONS:  1. 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      He has been inconsistent with his medications.  We addressed this again      today.  I did start ACE inhibitor therapy samples on his last visit,      although he has not continued with these.  I have suggested that he      reinstitute his regular regimen and let us know before he runs out of      medications so that we might call in his prescriptions.  He is      otherwise not experiencing any symptoms with underlying nonobstructive      coronary arteriosclerosis.  I will plan to see him back over the next      six months.  2. Further plans to follow up.       Jonelle Sidle, MD     SGM/MedQ  DD:  07/03/2006  DT:  07/04/2006  Job #:  [id number]   cc:   Dr. Okey Dupre

## 2011-02-18 NOTE — Op Note (Signed)
NAME:  [name] [name], [name] [name] NO.:  [id number]   MEDICAL RECORD NO.:  [id number]          PATIENT TYPE:  INP   LOCATION:  2301                         FACILITY:  [facility]   PHYSICIAN:  Salvatore Decent. 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   PROCEDURE:  Median sternotomy, extracorporeal circulation, aortic valve  replacement with a 25-mm bovine pericardial valve, coronary bypass grafting  x1 (saphenous vein graft to distal right coronary), endoscopic vein harvest  right thigh.   SURGEON:  Salvatore Decent. Dorris Fetch, M.D.   ASSISTANT:  Jerold Coombe, P.A.   ANESTHESIA:  General.   FINDINGS:  Right coronary good-quality target, vein graft good-quality,  aortic valve bicuspid heavily calcified and mobile with fixed leaflets with  critical aortic stenosis and moderate aortic regurgitation, marked left  ventricular hypertrophy and dilatation with ejection fraction of  approximately 25%, ascending aorta 3-4 cm.   CLINICAL NOTE:  [name] [name] is a 65 year old gentleman with a history of  polysubstance abuse and known moderate aortic stenosis and congestive heart  failure.  He presented to [facility] on 01/08/2006 with congestive heart  failure in shock, initially thought to possibly be sepsis, but the patient  did test positive for cocaine and had a single fever on admission.  He was  treated empirically with antibiotics.  He was found to be in congestive  heart failure and repeat echocardiogram revealed critical aortic stenosis  with valve gradient estimated at 50 mean and 80 peak.  He  underwent cardiac  catheterization.  He had moderate single vessel disease with a 50% stenosis  in his right coronary.  He only some luminal irregularities on the left.   The patient was advised to undergo aortic valve replacement after dental  extraction to remove decaying teeth.  The patient did require a low-dose  dopamine infusion in the preoperative period.  The patient understood the  high-risk nature of the procedure given his low ejection fraction and other  comorbidities.  We did discuss the valve options including mechanical or  tissue valves.  The patient was not felt to be reliable, nor did he feel  that he could maintain life style modification sufficient to safely be  treated with Coumadin.  The patient understood the tissue valves had a  significant failure rate in the intermediate and long-term but that was best  option in his case.   [name] [name] understood the indications, risks, benefits, alternatives and  agreed to proceed.   OPERATIVE NOTE:  [name] [name] was brought the preop holding area on 01/18/2006.  There the anesthesia service placed lines to monitor arterial, central  venous, pulmonary arterial pressure.  Intravenous antibiotics were  administered.  He was taken to the operating room, anesthetized and  intubated.  A Foley catheter was placed.  The chest and legs were prepped  and draped in usual fashion.  Transesophageal echocardiography was  performed.  It revealed a bicuspid and stenotic calcific aortic valve.  There was moderate aortic regurgitation.  There was no other significant  valvular pathology.  There was marked left ventricular dilatation and  hypertrophy, ejection fraction was approximately 25%.  A median sternotomy  was performed.  Simultaneously incision was made in the medial aspect the  right leg at the level of the knee and the greater saphenous vein was  harvested endoscopically from the lower right thigh, saphenous vein was good  quality.   After opening the sternum, the patient was fully heparinized.  Retractor was placed.  The pericardium was opened.  The ascending aorta was  inspected and palpated.  It was slightly enlarged with poststenotic  dilatation.  The maximum diameter was approximately 4 cm but the majority of  it was 3.5 or less.  This was not felt to require replacement.  The aorta  was cannulated via concentric 2-0 Ethibond pledgeted pursestring sutures.  A  dual stage venous cannula placed via pursestring suture in right atrial  appendage.  Cardiopulmonary bypass was instituted and the patient was cooled  to 28 degrees Celsius.  The right coronary artery was inspected and  anastomotic site was chosen.  The vein graft was inspected and a cut to  length.  A retrograde cardioplegia cannula was placed via pursestring suture  in the right atrium and directed in the coronary sinus.  A left ventricular  vent was placed via pursestring suture in the right superior pulmonary vein.  An antegrade cardioplegic cannula was placed in the ascending aorta.   The aorta was crossclamped, left ventricle was emptied via the aortic root  vent.  An initial attempt was made to achieve cardiac arrest with antegrade  cardioplegia, however the patient had aortic insufficiency and with 500 mL  cardioplegia did not have a significant drop in septal temperature and still  had cardiac activity.  The cardioplegia was switched to retrograde via the  coronary sinus with flows of approximately 200 mL per minute with pressures  maintained at less than 40 mmHg.  There was a complete diastolic arrest and  myocardial septal cooling to less than 10 degrees Celsius.  Additional  cardioplegia was administered via the retrograde cannula at 20-minute  intervals throughout the remainder of the crossclamp time.   The heart was elevated exposing the distal right coronary artery.  This was a 2-mm good-quality target.  The vein graft was of good quality.  It  was  anastomosed end-to-side with running 7-0 Prolene suture.  The anastomosis  probed easily.  Cardioplegia was administered through the graft.  There was  good flow and good hemostasis and cardioplegia was administered down the  right coronary vein graft at 30-minute intervals to improve protection of  the right coronary circulation, particularly right ventricle throughout the  remainder of crossclamp time.   Next a transverse aortotomy was made.  The aortic valve was inspected.  There was heavily calcified with markedly thickened leaflets have which were  completely immobile.  There was fixed orifice.  There was moderate annular  calcification.  The leaflets were excised.  The annular calcification was  debrided.  Care was taken to remove all debris during the debridement and  the annulus was copiously irrigated with iced saline at the completion of  the debridement.  The coronary ostia were inspected.  They  were well away  from the aortotomy as well as well away from the annulus.  The annulus sized  for a 25 mm pericardial valve. 2-0 Ethibond horizontal mattress sutures with  subannular pledgets were placed circumferentially around the annulus.  These  were then brought through the sewing ring of the valve.  The valve was  lowered into place and the sutures sequentially tied.  After placing the  valve leaflets were gently spread to ensure good seating of the valve.  The  annulus then was probed and there was an area near at the pseudo commissure  of what would have been the right and left leaflets, had it been a  trileaflet valve where there was a possible gap.  A 4-0 Prolene pledgeted  suture was taken through this area to ensure that there was no perivalvular  leak.  Rewarming was begun.  The aortotomy then was closed in two layers.  The first layer was a running 4-0 Prolene horizontal mattress suture.  The  second layer was a running 4-0 Prolene simple suture.  Teflon felt strips   were used on the aortotomy both proximally and distally.   After completion of closure of the aortotomy, the cardioplegia cannulas  removed from the ascending aorta.  The vein graft was cut to length.  Proximal vein graft anastomoses then was performed to a 4.5 mm punch  aortotomy with running 6-0 Prolene suture.  At the completion of this  anastomosis, the patient was placed in Trendelenburg position.  De-airing  maneuvers were performed.  A warm dose of cardioplegia was administered via  the retrograde catheter. 500 mL of cardioplegia was administered in this  fashion.  After de-airing the aortic root.  The aortic crossclamp was  removed.  The total crossclamp time was 110 minutes.   A 4-0 Prolene suture was used to secure a McGoon needle in the ascending  aorta to remove any additional air which might be present.  This was placed  to a vent line but there was no additional air noted on echocardiography. The patient was rewarmed to a core temperature of 37 degrees Celsius.  His  low-dose dopamine infusion was continued.  The retrograde cardioplegic  cannulae and left ventricular vent were removed.  Epicardial pacing wires  were placed on the right ventricle and right atrium.  After the patient had  rewarmed to 37 degrees Celsius he was weaned from cardiopulmonary bypass on  dopamine infusion of 5 mcg kilogram per minute and DDD paced.  Postbypass  transesophageal echocardiography revealed dilated hypertrophied ventricle  again with ejection fraction  approximately 25-30%.  There was good movement  of all three leaflets of the prosthetic valve with no perivalvular leaks.   A test dose of protamine was administered and was well tolerated.  The  atrial aortic cannulae were removed.  The remainder of the protamine was  administered without incident.  The chest irrigated with 1 liter of normal  saline containing 1 gram of vancomycin.  Hemostasis was achieved.  The  pericardium was  reapproximated with interrupted 3-0 silk sutures.  It came  together easily without  tension.  Two mediastinal chest tubes were placed  through separate subcostal incisions.  The sternum was closed with single  and double interrupted heavy gauge stainless steel wires.  The remaining  incisions closed in standard fashion with subcuticular closures used the  skin.  All sponge, needle and sponge counts were correct at end of the  procedure.  The patient  was transported from the operating room to surgical  intensive care unit in critical but stable condition.           ______________________________  Salvatore Decent Dorris Fetch, M.D.     SCH/MEDQ  D:  01/18/2006  T:  01/19/2006  Job:  [id number]   cc:   Jonelle Sidle, M.D. [facility]  [address]  [place]  [place] [address]   Hollice Espy, M.D.   Charlynne Pander, D.D.S.  Fax: [phone]

## 2011-02-18 NOTE — Discharge Summary (Signed)
NAME:  ISAID, SALVIA NO.:  [id number]   MEDICAL RECORD NO.:  [id number]          PATIENT TYPE:  INP   LOCATION:  2027                         FACILITY:  [facility]   PHYSICIAN:  Salvatore Decent. Dorris Fetch, M.D.DATE OF BIRTH:  06/08/1946   DATE OF ADMISSION:  01/08/2006  DATE OF DISCHARGE:  02/02/2006                                 DISCHARGE SUMMARY   PRIMARY ADMITTING DIAGNOSIS:  Shortness of breath.   ADDITIONAL/DISCHARGE DIAGNOSES:  1.  Critical aortic stenosis.  2.  Congestive heart failure.  3.  Valvular cardiomyopathy.  4.  Single vessel coronary artery disease.  5.  History of chronic obstructive pulmonary disease.  6.  A history of polysubstance abuse.  7.  Hypertension.  8.  Postoperative tracheobronchitis/pneumonia.  9.  Atrial fibrillation.  10. Postoperative acute blood loss anemia.  11. Chronic periodontitis.   PROCEDURES PERFORMED:  1.  Cardiac catheterization.  2.  Multiple dental extractions.  3.  Aortic valve replacement with 25-mm bovine pericardial valve.  4.  Coronary artery bypass grafting times one (saphenous vein graft to the      distal right coronary).  5.  Endoscopic vein harvest, right thigh.   HISTORY:  The patient is a 65 year old white male who has a known history of  severe aortic stenosis and moderate aortic insufficiency as well as  congestive heart failure.  He presented to the emergency department on the  date of this admission complaining of shortness of breath, cough, fevers and  chills.  He was found to be in probable septic shock and was admitted for  further evaluation and treatment.   HOSPITAL COURSE:  The patient was admitted by internal medicine service and  started on antibiotics empirically as well as a dopamine drip.  A cardiology  consult was obtained by [facility] for evaluation of his congestive  heart failure.  A repeat echocardiogram was performed, which revealed  critical aortic stenosis with a valve  gradient estimated at 15 mean and 80  peak.  His urine drug screen on admission was positive for cocaine.  He was  started on Lasix and was diuresed.  He was started on amiodarone for  supraventricular tachycardia.  He ultimately stabilized and by January 11, 2006 was able to undergo cardiac catheterization.  This showed  single-  vessel coronary artery disease with a 50% stenosis of the right coronary  artery.  He was also confirmed to have critical aortic stenosis, and  ejection fraction by echo was estimated at 20-25%.  A cardiothoracic surgery  consultation was obtained, and the patient was subsequently seen by Dr.  Charlett Lango.  Dr. Dorris Fetch agreed that his best course of action  would be to proceed with aortic valve replacement and single-vessel cabbage.  Prior to proceeding with surgery, a dental consultation was obtained, and  the patient was seen by Dr. Kristin Bruins.  He underwent multiple dental  extractions.  Also in the course of his preoperative workup, he underwent  carotid Doppler studies which were negative and lower extremity Doppler  studies which showed normal ABIs.  After an acceptable recovery  period from  his dental extractions, he was felt to be stable and ready to proceed with  his coronary surgery.  Dr. Dorris Fetch discussed the risks, benefits and  alternatives of surgery at length with the patient as well as multiple valve  options.  He was not felt to be a good candidate for mechanical valve or  anticoagulation with Coumadin, because of his history of substance abuse in  the past and his noncompliance with medical treatment in the past.  The  patient agreed to proceed with surgery.  He was taken to the operating room  on January 18, 2006 and underwent a CABG times one and an aortic valve  replacement as described in detail above.  He tolerated the procedure well  was transferred to the SICU in stable condition.  He remained intubated  until postop day one.  At  that time, he was somewhat agitated but was weaned  from the ventilator and was able to be extubated late in the day.  He remain  in the ICU for further observation and  was maintained on dopamine and Neo-  Synephrine drip.  His blood pressure remained on the low side, and he was  continued on his drips over the course of the second postoperative day.  He  also received a transfusion of packed red blood cells for a hemoglobin of  7.3.  By postop day three, he was off all drips.  He was started on aspirin  and Plavix, which will be continued for 4 weeks.  He was also started on low-  dose diuretics.  He was continued on Avelox, which had been started  preoperatively for presumed tracheobronchitis.  He developed atrial  fibrillation and was initially started on digoxin and amiodarone.  He  converted to normal sinus rhythm but became somewhat bradycardic, requiring  atrial pacing.  His digoxin was discontinued, and he was continued on  amiodarone and started on a low-dose beta blocker.  He required an  additional transfusion of packed red blood cells for anemia.  By postop day  five, he was improving.  His blood pressure was stable.  He was back in  atrial fibrillation with a controlled rate, and he was hemodynamically  stable.  He was transferred to the floor at that time.  Since then his main  postoperative difficulty has been related to his pulmonary status.  He  continued to require supplemental oxygen and experienced desaturations,  especially with exertion and ambulation.  He was treated with aggressive  pulmonary toilet measures, incentive spirometry, nebulizer treatments and  was continued on antibiotics without significant improvement.  He developed  a productive cough with purulent-appearing sputum despite antibiotic  therapy.  Chest x-ray showed a right basilar infiltrate and small bilateral  effusions.  His antibiotic regimen was switched to Zosyn for broader spectrum coverage, and  he was aggressively diuresed.  Since that time, he  has shown improvement.  Because of his persistent  problems with hypoxemia  and lower extremity edema, he underwent a lower extremity Doppler study and  was ruled out for DVT.  Also he underwent a CT of the chest, which showed no  evidence of a pulmonary embolus.  At the present time, his chest x-ray has  improved significantly, and on physical exam his lungs are slightly  decreased in the bases, although he has no wheezes, rales or rhonchi.  He  still is requiring 1-2 L of supplemental oxygen at rest and to 3-4 liters  with  ambulation, which is thought to be secondary to his history of COPD and  ongoing tobacco abuse.  Otherwise postoperatively he has done fairly well.  He remained in rate-controlled atrial fibrillation on amiodarone Zebeta, and  ultimately prior to discharge he converted to normal sinus rhythm.  He was  slowly able to be mobilized with the assistance of cardiac rehab phase one  and at the time of discharge was ambulating 600-700 feet with minimal  assistance.  He did experience some bradycardia with heart rates into the  50s on amiodarone, and his dose was decreased.  At the time of discharge, he  was decreased to 200 mg daily, and his heart rates were remaining stable in  the 60s.  Overall he has continued to progress well.  He has been diuresed  back down to his preoperative weight.  His most recent labs show a sodium of  138, potassium 4.9, BUN 12, creatinine 1.5, hemoglobin 10.1, hematocrit  30.3, white count 7.4, platelets 223.  His chest x-ray, as previously  mentioned, showed improvement.  Surgical incision sites were clean and dry  and healing well.  He was maintaining normal sinus rhythm.  He was seen and  evaluated on the morning of Feb 02, 2006, and at that time after having  remained stable and showing evidence improvement, it was felt he could be  discharged home.   Discharge medications are as follows.  1.   Aspirin 325 mg daily.  2.  Plavix 75 mg daily x 1 month.  3.  Zebeta 2.5 mg daily.  4.  Lovastatin 20 mg daily.  5.  Amiodarone 200 mg daily.  Multivitamin 1 daily.  6.  Lasix 20 mg daily times 2 weeks.  7.  Potassium 20 mEq daily times 2 weeks.  8.  Spiriva 1 puff daily.  9.  Humibid L.A. 2 puffs b.i.d.  10. Augmentin 500 mg b.i.d. times 1 week.  11. Tylox 1-2 q.4-6 h. p.r.n. for pain.   DISCHARGE INSTRUCTIONS:  1.  He is asked to refrain from driving, heavy lifting or strenuous      activity.  2.  He may continue ambulating daily and using his incentive spirometer.  3.  He may shower daily and clean his incisions with soap and water.  4.  He will continue a low-fat, low-sodium diet.   DISCHARGE FOLLOWUP:  He will see Dr. Diona Browner back in the office in 2 weeks  and will have a chest x-ray at that visit.  He will see Dr. Dorris Fetch on  May 22 at 3:30 p.m.  His chest x-ray will be reviewed at that time, and in the interim he may call if he has questions.  Also a home health nurse has  been arranged for cardiac surgery  followup, and he will continue with  oxygen at 2-4 L per minute at home post discharge.      Coral Ceo, P.A.    ______________________________  Salvatore Decent Dorris Fetch, M.D.    GC/MEDQ  D:  02/02/2006  T:  02/03/2006  Job:  [id number]   cc:   Jonelle Sidle, M.D. [facility]  [address]  [place]  [place] [address]   Osvaldo Shipper, MD

## 2011-02-18 NOTE — H&P (Signed)
NAME:  [name] [name], [name] [name] NO.:  [id number]   MEDICAL RECORD NO.:  [id number]          PATIENT TYPE:  INP   LOCATION:  2301                         FACILITY:  [facility]   PHYSICIAN:  Corinna L. Lendell Caprice, MDDATE OF BIRTH:  Aug 23, 1946   DATE OF ADMISSION:  01/08/2006  DATE OF DISCHARGE:                                HISTORY & PHYSICAL   CHIEF COMPLAINT:  Shortness of breath   HISTORY OF PRESENT ILLNESS:  [name] [name] is an unassigned, 65 year old, white  male who presents to the emergency room with a several day history of cough,  productive of scant, clear sputum. He has also had fevers, chills, and  weakness.  He feels like he is going to pass out.  He also reports chest  pain, which the triage nurse reported was between his shoulder blades and  left ribs.  He has a history of COPD and aneurysm around the heart.  He  was seen in the emergency room on April 5 and apparently was discharged home  after some bronchodilators.  The patient reports that he is a [name] [name]  outpatient clinic patient, but apparently the ED physician called the  resident on call who reported that the patient had not seen them since 2003.  The patient, however, reports that he just saw someone in the clinic 3  months ago.  Nevertheless, I have been asked to admit this patient.   PAST MEDICAL HISTORY:  Sketchy, but according to patient  1.  COPD.  2.  Hypertension.  3.  What sounds like a thoracic aortic aneurysm and possibly a history of      heart failure.  He sees [facility] for this aneurysm around      his heart.   MEDICATIONS:  1.  He reports that he has an inhaler that has expired.  2.  Lasix 80 mg a day.  3.  Heart pill.   SOCIAL HISTORY:  He quit smoking last year.  He denies drinking or drugs. He  is single and disabled.   FAMILY HISTORY:  Noncontributory.   REVIEW OF SYSTEMS:  As above, otherwise negative.   PHYSICAL EXAMINATION:  VITAL SIGNS:  His rectal temperature was  102.6, blood  pressure initially was 93/66 and dropped to 70/40, pulse 83, respiratory  rate 28.  His oxygen saturation is currently 94% on 2 liters nasal cannula  oxygen.  GENERAL:  The patient is a very thin appearing white male in quite a bit of  respiratory distress who is able to speak in short phrases.  HEENT:  Normocephalic, atraumatic.  Pupils are equal, round, and reacted to  light.  Sclerae nonicteric.  Moist mucous membranes.  NECK:  Supple.  No carotid bruits.  No JVD.  No thyromegaly.  LUNGS:  He is using accessory muscles, diminished breath sounds throughout  with bilateral expiratory wheeze and prolonged expiratory phase.  No rhonchi  or rales.  CARDIOVASCULAR:  Regular rate and rhythm without murmurs, gallops, or rubs.  ABDOMEN:  Normal bowel sounds, soft, nontender, nondistended.  GENITOURINARY AND RECTAL:  Deferred.  EXTREMITIES:  No  clubbing, cyanosis, or edema.  SKIN:  No rash.  PSYCHIATRIC:  Normal affect.  NEUROLOGIC:  Patient is alert and oriented.  Cranial nerves and sensory and  motor exams are grossly intact.   EKG:  Shows normal sinus rhythm with age indeterminate anteroseptal infarct  and LVH by voltage with strain pattern, unchanged from January 04, 2006.   LABS:  His CBC is unremarkable.  Basic metabolic panel significant for a  creatinine of 1.3, otherwise unremarkable.  It looks as though he had a BNP  on April 4 which was greater than 3200.  ABG was just done; and which I  ordered, showed a pH of 7.411, pCO2 of 45, pO2 of 82, bicarbonate of 29.  Oxygen saturation 96% on 2 liters nasal cannula oxygen.  Chest x-ray shows  COPD and was read as enlarged cardiac silhouette with pulmonary vascular  congestion and interstitial edema pattern.   ASSESSMENT AND PLAN:  1.  Shock probably septic shock.  I have called the critical care team to      start this sepsis protocol if they feel appropriate.  I have also asked      the nurse to start some dopamine drip in  the interim, to keep his MAP      above 65.  Blood cultures have been drawn.  Patient has been given      azithromycin and Rocephin by the ED physician.  Currently his blood      pressure is back up to 90 according to the nurse.  He will be admitted      to the intensive care unit.  2.  Chronic obstructive pulmonary disease exacerbation possibly pneumonia:      The patient will get, in addition to the antibiotics, oxygen, nebulizer,      Solu-Medrol.  While on the steroids we will monitor his blood glucoses      and hopefully critical care can assist with his pulmonary status.  3.  Chest pain:  This certainly is atypical for acute coronary syndrome;      however, due to this questionable thoracic aortic aneurysm, I will get a      STAT CT of the chest to rule out aortic dissection; and for now I will      hold on any DVT prophylaxis until further data base is back.  I will      place sequential compression devices, however,.  4.  Reported history of congestive heart failure:  He may need CVP      monitoring or Swan-Ganz catheterization; but, at this point, he appears      to have more of an infectious etiology rather than a CHF type of picture      despite his chest x-ray.  I will; however, get a BMP and it will be      important to get records from his cardiologist. I will also order an      echocardiogram and repeat the BMP.  I will also order the cardiac      enzymes.   Total critical care time is 60 minutes.      Corinna L. Lendell Caprice, MD  Electronically Signed     CLS/MEDQ  D:  01/08/2006  T:  01/08/2006  Job:  [id number]

## 2011-02-18 NOTE — Cardiovascular Report (Signed)
NAME:  [name], [name] NO.:  [id number]   MEDICAL RECORD NO.:  [id number]          PATIENT TYPE:  INP   LOCATION:  2915                         FACILITY:  [facility]   PHYSICIAN:  Arvilla Meres, M.D. [facility] OF BIRTH:  1946/02/27   DATE OF PROCEDURE:  01/11/2006  DATE OF DISCHARGE:                              CARDIAC CATHETERIZATION   PRIMARY CARE PHYSICIAN:  The teaching service.   CARDIOLOGIST:  Dr. Simona Huh.   PATIENT IDENTIFICATION:  [name] is a 65 year old male with history of  COPD and ongoing tobacco use.  He also has known severe aortic stenosis, for  which he has been refusing surgical evaluation.  Over the weekend, he was  admitted with acute respiratory distress secondary to pulmonary edema and  low output state.  He was diuresed and resuscitated with dopamine.  He is  now improved.  Echocardiogram showed an EF of 20-25% with critical aortic  stenosis with a heavily calcified valve.  He is referred for cardiac  catheterization to evaluate his coronaries prior to surgical consideration.   PROCEDURES PERFORMED:  1.  Selective coronary angiography.  2.  Right heart cath.  3.  AngioSeal closure device.   Of note, several attempts were made to across the aortic valve with a right  coronary catheter and a straight wire; however, the valve was very heavily  calcified, and we were unable to cross it.   DESCRIPTION OF PROCEDURE:  The risks and benefits of catheterization were  explained.  Consent was signed and placed on the chart.  A 6-French arterial  sheath was placed in right femoral artery using a modified Seldinger  technique.  Standard catheters including a JL-5 and JR-4 were used for the  procedure.  All catheter exchanges were made over wire.  As above, we were  unable to cross the aortic valve using the JR-4 and a straight wire.  A 7-  French venous sheath was placed in the right femoral vein using a modified  Seldinger technique.   Standard Swan-Ganz catheter was used for the  procedure.  After the completion of the procedure, right femoral arteriotomy  site was closed using the AngioSeal closure device.  There was good  hemostasis.   Right atrial pressure was mean of 2.  RV is 28/1.  PA was 34/14 with a mean  21.  Pulmonary capillary wedge pressure was 11.  Central aortic pressure was  93/50 with a mean of 68.  The aortic valve was not crossed.  Fick cardiac  output was 3.5 liters per minute.  Cardiac index 1.9 liters per minute.  Thermodilution cardiac output was 3.9 liters per minute, and cardiac index  was 2.2 liters per minute per meter squared on 5 mcg/kg per minute of  dopamine.   Coronary anatomy of the left main was normal.   LAD was a long vessel wrapping the apex.  It gave off a very large branching  first diagonal which covers nearly as much territory as the LAD itself.  In  the mid portion of the LAD right after the takeoff of the diagonal there  appeared to be a 40% lesion.   The left circumflex system was a moderate-sized system.  It gave off a small  ramus and a large OM1.  The distal AV groove circ was small.  There was no  angiographic CAD.   The right coronary artery was a very large dominant system.  It gave off a  branching RV branch and a large PDA and a large PL.  There is a 30% tubular  lesion in the proximal portion followed by a 50% mid-lesion at the takeoff  of the RV branch.   ASSESSMENT:  1.  Mild nonobstructive coronary disease as described above.  2.  Critical aortic stenosis with a heavily calcified valve by      echocardiogram.  3.  Severely depressed ejection fraction of 20-25% by echocardiogram.  4.  Admitted with cardiogenic shock with low output requiring dopamine.   PLAN:  The plan will be to have him evaluated by CVTS for surgical  consideration for aortic valve replacement.      Arvilla Meres, M.D. [facility]  Electronically Signed     DB/MEDQ  D:  01/12/2006   T:  01/12/2006  Job:  [id number]

## 2011-02-21 ENCOUNTER — Encounter: Payer: Self-pay | Admitting: Cardiovascular Disease

## 2011-03-01 ENCOUNTER — Encounter: Payer: Self-pay | Admitting: Cardiovascular Disease

## 2011-03-01 ENCOUNTER — Ambulatory Visit (INDEPENDENT_AMBULATORY_CARE_PROVIDER_SITE_OTHER): Payer: Medicaid Other | Admitting: Cardiovascular Disease

## 2011-03-01 VITALS — BP 188/88 | HR 71 | Resp 18 | Ht 69.0 in | Wt 143.0 lb

## 2011-03-01 DIAGNOSIS — I10 Essential (primary) hypertension: Secondary | ICD-10-CM | POA: Insufficient documentation

## 2011-03-01 DIAGNOSIS — I712 Thoracic aortic aneurysm, without rupture: Secondary | ICD-10-CM | POA: Insufficient documentation

## 2011-03-01 DIAGNOSIS — I359 Nonrheumatic aortic valve disorder, unspecified: Secondary | ICD-10-CM

## 2011-03-01 DIAGNOSIS — Z954 Presence of other heart-valve replacement: Secondary | ICD-10-CM

## 2011-03-01 NOTE — Progress Notes (Signed)
History of Present Illness:[name] with history of severe aortic valve stenosis with bioprosthetic aortic valve replacement April 2007 who is here today for cardiac follow up.  An echo on December 07, 2007 showed normal LV systolic function with EF of 60%, functioning AV replacement with mild perivalvular leak, mild aortic root dilatation. LVEF prior to his AVR was known to be 25-30. I saw him last in October  2011. Repeat echo October 2011 stable. He is known to have an ascending aortic aneurysm.   He tells me that he has been well. He describes no chest pain. His breathing is stable. He wears oxygen at home when he needs it. He has stable NYHA class II symptoms. No near syncope or syncope. No PND, orthopnea or LE edema. His BP is elevated today. He does not check his BP at home. He has been eating ham lately, every meal for past two weeks.    Past Medical History  Diagnosis Date  . COPD (chronic obstructive pulmonary disease)     FEV1/FVC -> 0.52 (69% of predicted)  . Aortic stenosis     s/p bioprosthetic valve replacement  . Osteoarthritis   . Cardiomyopathy 2006    EF 30%; has history of CHF  . Tobacco abuse     Past Surgical History  Procedure Date  . Aortic valve replacement     bioprosthetic, 01/2006 - cannot find record of surgery (has scar)  . Tonsillectomy     Current Outpatient Prescriptions  Medication Sig Dispense Refill  . albuterol (PROVENTIL,VENTOLIN) 90 MCG/ACT inhaler Inhale 2 puffs into the lungs as directed. Use as directed as needed  17 g  12  . aspirin 325 MG tablet Take 325 mg by mouth. Every other day      . clopidogrel (PLAVIX) 75 MG tablet Take 75 mg by mouth daily.        . furosemide (LASIX) 40 MG tablet Take 40 mg by mouth daily.        Marland Kitchen lovastatin (MEVACOR) 20 MG tablet Take 20 mg by mouth daily.        . methocarbamol (ROBAXIN) 500 MG tablet Take 500 mg by mouth 3 (three) times daily. Take 1 tab by mouth no more than 3 times a day as needed for pain or  spasms in back       . multivitamin (THERAGRAN) per tablet Take 1 tablet by mouth daily.        . naproxen (NAPROSYN) 500 MG tablet Take 500 mg by mouth 2 (two) times daily with a meal.        . potassium chloride (KLOR-CON) 10 MEQ CR tablet Take 10 mEq by mouth daily.        Marland Kitchen tiotropium (SPIRIVA) 18 MCG inhalation capsule Place 18 mcg into inhaler and inhale daily. Inhale contents of one capsule daily         No Known Allergies  History   Social History  . Marital Status: Single    Spouse Name: N/A    Number of Children: N/A  . Years of Education: N/A   Occupational History  . Not on file.   Social History Main Topics  . Smoking status: Current Everyday Smoker -- 0.2 packs/day for 50 years    Types: Cigarettes  . Smokeless tobacco: Not on file  . Alcohol Use: No     states he quit many years ago  . Drug Use: No     previous h/o cocaine use, denies since  2006  . Sexually Active:    Other Topics Concern  . Not on file   Social History Narrative  . No narrative on file    Family History  Problem Relation Age of Onset  . Cancer Mother     uterine cancer  . Heart failure Father   . Coronary artery disease Father   . Cancer Brother     neck/throat cancer    Review of Systems:  As stated in the HPI and otherwise negative.   BP 188/88  Pulse 71  Resp 18  Ht 5\' 9"  (1.753 m)  Wt 143 lb (64.864 kg)  BMI 21.12 kg/m2  Physical Examination: General: Well developed, well nourished, NAD HEENT: OP clear, mucus membranes moist SKIN: warm, dry. No rashes. Neuro: No focal deficits Musculoskeletal: Muscle strength 5/5 all ext Psychiatric: Mood and affect normal Neck: No JVD, no carotid bruits, no thyromegaly, no lymphadenopathy. Lungs:Clear bilaterally, no wheezes, rhonci, crackles Cardiovascular: Regular rate and rhythm. No murmurs, gallops or rubs. Abdomen:Soft. Bowel sounds present. Non-tender.  Extremities: No lower extremity edema. Pulses are 2 + in the bilateral  DP/PT.  EKG: Sinus rhythm, rate 71 bpm. LBBB.

## 2011-03-01 NOTE — Assessment & Plan Note (Signed)
Will follow with serial echo. Repeat echo in 6 months. If there is any change in size, will need CTA chest.

## 2011-03-01 NOTE — Assessment & Plan Note (Signed)
S/p bioprosthetic AVR in 2007. Will plan repeat echo in 6 months. (to assess valve and follow thoracic aortic aneurysm).  

## 2011-03-01 NOTE — Assessment & Plan Note (Signed)
S/p bioprosthetic AVR in 2007. Will plan repeat echo in 6 months. (to assess valve and follow thoracic aortic aneurysm).

## 2011-03-01 NOTE — Patient Instructions (Signed)
Your physician recommends that you schedule a follow-up appointment in: 6 months with an echocardiogram a few days prior to the appointment.

## 2011-03-01 NOTE — Assessment & Plan Note (Signed)
BP elevated today. Pt admits to dietary non-compliance. He does not wish to start therapy. Follow in primary care.

## 2011-04-12 ENCOUNTER — Ambulatory Visit (INDEPENDENT_AMBULATORY_CARE_PROVIDER_SITE_OTHER): Payer: Medicaid Other

## 2011-04-12 ENCOUNTER — Inpatient Hospital Stay (INDEPENDENT_AMBULATORY_CARE_PROVIDER_SITE_OTHER)
Admission: RE | Admit: 2011-04-12 | Discharge: 2011-04-12 | Disposition: A | Discharge status: Home / Self Care | Payer: Medicaid Other | Source: Ambulatory Visit | Attending: Emergency Medicine | Admitting: Emergency Medicine

## 2011-04-12 DIAGNOSIS — J449 Chronic obstructive pulmonary disease, unspecified: Secondary | ICD-10-CM

## 2011-04-12 DIAGNOSIS — Z760 Encounter for issue of repeat prescription: Secondary | ICD-10-CM

## 2011-04-12 DIAGNOSIS — J4489 Other specified chronic obstructive pulmonary disease: Secondary | ICD-10-CM

## 2011-04-12 LAB — POCT I-STAT, CHEM 8
BUN: 16 mg/dL (ref 6–23)
Calcium, Ion: 1.18 mmol/L (ref 1.12–1.32)
Chloride: 104 mEq/L (ref 96–112)
Creatinine, Ser: 1 mg/dL (ref 0.50–1.35)
Glucose, Bld: 87 mg/dL (ref 70–99)
HCT: 44 % (ref 39.0–52.0)
Hemoglobin: 15 g/dL (ref 13.0–17.0)
Potassium: 3.8 mEq/L (ref 3.5–5.1)
Sodium: 139 mEq/L (ref 135–145)
TCO2: 26 mmol/L (ref 0–100)

## 2011-04-12 LAB — CBC
HCT: 41.1 % (ref 39.0–52.0)
Hemoglobin: 14.4 g/dL (ref 13.0–17.0)
MCH: 30.6 pg (ref 26.0–34.0)
MCHC: 35 g/dL (ref 30.0–36.0)
MCV: 87.3 fL (ref 78.0–100.0)
Platelets: 183 10*3/uL (ref 150–400)
RBC: 4.71 MIL/uL (ref 4.22–5.81)
RDW: 13.6 % (ref 11.5–15.5)
WBC: 6.6 10*3/uL (ref 4.0–10.5)

## 2011-05-25 ENCOUNTER — Encounter: Payer: Self-pay | Admitting: Internal Medicine

## 2011-05-25 ENCOUNTER — Ambulatory Visit (INDEPENDENT_AMBULATORY_CARE_PROVIDER_SITE_OTHER): Payer: Medicaid Other | Admitting: Internal Medicine

## 2011-05-25 VITALS — BP 118/80 | HR 60 | Temp 96.7°F | Ht 68.5 in | Wt 151.2 lb

## 2011-05-25 DIAGNOSIS — J449 Chronic obstructive pulmonary disease, unspecified: Secondary | ICD-10-CM

## 2011-05-25 DIAGNOSIS — F17200 Nicotine dependence, unspecified, uncomplicated: Secondary | ICD-10-CM

## 2011-05-25 NOTE — Patient Instructions (Signed)
Stop smoking before it stops you  Ok to nebulizer if you must up to every 4 hours as needed (or use the ventolin but you   Please schedule a follow up office visit in 6 weeks, call sooner if needed with pft's

## 2011-05-25 NOTE — Progress Notes (Signed)
  Subjective:    Patient ID: PARRY PO, male    DOB: Dec 02, 1945, 65 y.o.   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  05/25/2011 Initial pulmonary office eval cc doe x 2006 despite spiriva daily then placed on neb and much better.  Can walk 8-10 blocks slow paced without stopping but can't do hills or walk a a normal pace, mildly progressive since noted first in 2006.  Minimal am cough, no excess mucus.  Not much better on spiriva.   Pt denies any significant sore throat, dysphagia, itching, sneezing,  nasal congestion or excess/ purulent secretions,  fever, chills, sweats, unintended wt loss, pleuritic or exertional cp, hempoptysis, orthopnea pnd or leg swelling.    Also denies any obvious fluctuation of symptoms with weather or environmental changes or other aggravating or alleviating factors.     Review of Systems  Constitutional: Negative for fever, chills, activity change, appetite change and unexpected weight change.  HENT: Positive for sneezing. Negative for congestion, sore throat, rhinorrhea, trouble swallowing, dental problem, voice change and postnasal drip.   Eyes: Negative for visual disturbance.  Respiratory: Positive for chest tightness and shortness of breath. Negative for cough and choking.   Cardiovascular: Negative for chest pain and leg swelling.  Gastrointestinal: Negative for nausea, vomiting and abdominal pain.  Genitourinary: Negative for difficulty urinating.  Musculoskeletal: Positive for arthralgias.  Skin: Negative for rash.  Psychiatric/Behavioral: Negative for behavioral problems and confusion.       Objective:   Physical Exam . 05/25/2011  Wt 151  disshevled amb chronically ill appearing amb wm nad HEENT mild turbinate edema.  Oropharynx no thrush or excess pnd or cobblestoning.  No JVD or cervical adenopathy. Mild accessory muscle hypertrophy. Trachea midline, nl  thryroid. Chest was hyperinflated by percussion with diminished breath sounds and moderate increased exp time without wheeze. Hoover sign positive at mid inspiration. Regular rate and rhythm without murmur gallop or rub or increase P2 or edema.  Abd: no hsm, nl excursion. Ext warm without cyanosis or clubbing.     cxr 04/12/11  Stable chronic pulmonary hyperinflation and cardiomegaly.      Assessment & Plan:

## 2011-05-26 ENCOUNTER — Encounter: Payer: Self-pay | Admitting: Internal Medicine

## 2011-05-26 NOTE — Assessment & Plan Note (Signed)
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    Will see pt back for pft's and then consider trial of advair or symbicort

## 2011-05-26 NOTE — Assessment & Plan Note (Signed)
I reviewed the Flethcher curve with patient that basically indicates  if you quit smoking when your best day FEV1 is still well preserved it is highly unlikely you will progress to severe disease and informed the patient there was no medication on the market that has proven to change the curve or the likelihood of progression.  Therefore stopping smoking and maintaining abstinence is the most important aspect of care, not choice of inhalers or for that matter, doctors.    I emphasized that although we never turn away smokers from the pulmonary clinic, we do ask that they understand that the recommendations that we make  won't work nearly as well in the presence of continued cigarette exposure.  In fact, we may very well  reach a point where we can't promise to help the patient if he/she can't quit smoking. (We can and will promise to try to help, we just can't promise what we recommend will really work)  

## 2011-08-19 ENCOUNTER — Ambulatory Visit (INDEPENDENT_AMBULATORY_CARE_PROVIDER_SITE_OTHER): Payer: Medicaid Other | Admitting: Internal Medicine

## 2011-08-19 ENCOUNTER — Encounter: Payer: Self-pay | Admitting: Internal Medicine

## 2011-08-19 VITALS — BP 138/90 | HR 87 | Temp 97.7°F | Ht 69.0 in | Wt 165.0 lb

## 2011-08-19 DIAGNOSIS — J449 Chronic obstructive pulmonary disease, unspecified: Secondary | ICD-10-CM

## 2011-08-19 DIAGNOSIS — F17200 Nicotine dependence, unspecified, uncomplicated: Secondary | ICD-10-CM

## 2011-08-19 MED ORDER — TIOTROPIUM BROMIDE MONOHYDRATE 18 MCG IN CAPS: ORAL_CAPSULE | RESPIRATORY_TRACT | Status: DC

## 2011-08-19 NOTE — Progress Notes (Signed)
  Subjective:    Patient ID: [name] [name], male    DOB: [birth year], 65 y.o.   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  05/25/2011 Initial pulmonary office eval cc doe x 2006 despite spiriva daily then placed on neb and much better.  Can walk 8-10 blocks slow paced without stopping but can't do hills or walk a a normal pace, mildly progressive since noted first in 2006.  Minimal am cough, no excess mucus.  Not much better on spiriva.  rec Stop smoking before it stops you Ok to nebulizer if you must up to every 4 hours as needed (or use the ventolin spray)    08/19/2011 f/u ov/Jaiyah Beining still smoking  cc Pt states breathing has improved since the last visit but only if uses the nebulizer before walking twice daily but . Sleeping ok without nocturnal  or early am exacerbation  of respiratory  c/o's or need for noct saba. Also denies any obvious fluctuation of symptoms with weather or environmental changes or other aggravating or alleviating factors except as outlined above    Pt denies any significant sore throat, dysphagia, itching, sneezing,  nasal congestion or excess/ purulent secretions,  fever, chills, sweats, unintended wt loss, pleuritic or exertional cp, hempoptysis, orthopnea pnd or leg swelling.    ROS  At present neg for  any significant sore throat, dysphagia, itching, sneezing,  nasal congestion or excess/ purulent secretions,  fever, chills, sweats, unintended wt loss, pleuritic or exertional cp, hempoptysis, orthopnea pnd or leg swelling.  Also denies presyncope, palpitations, heartburn, abdominal pain, nausea, vomiting, diarrhea  or change in bowel or urinary habits, dysuria,hematuria,  rash, arthralgias, visual complaints, headache, numbness weakness or ataxia.              Objective:   Physical Exam . 05/25/2011  Wt 151 >  08/19/2011  165 disshevled amb chronically ill appearing amb  wm nad HEENT mild turbinate edema.  Oropharynx no thrush or excess pnd or cobblestoning.  No JVD or cervical adenopathy. Mild accessory muscle hypertrophy. Trachea midline, nl thryroid. Chest was hyperinflated by percussion with diminished breath sounds and moderate increased exp time without wheeze. Hoover sign positive at mid inspiration. Regular rate and rhythm without murmur gallop or rub or increase P2 or edema.  Abd: no hsm, nl excursion. Ext warm without cyanosis or clubbing.     cxr 04/12/11  Stable chronic pulmonary hyperinflation and cardiomegaly.      Assessment & Plan:

## 2011-08-19 NOTE — Patient Instructions (Addendum)
Remember to use the spiriva 1st thing in am but it's a smooth really deep drag   Only use your albuterol (ventolin)as a rescue medication to be used if you can't catch your breath by resting or doing a relaxed purse lip breathing pattern. The less you use it, the better it will work when you need it.   Please schedule a follow up office visit in 6 weeks, call sooner if needed with PFT's on return

## 2011-08-20 NOTE — Assessment & Plan Note (Signed)
Overall improved despite active smoking but has not returned yet for pft's rec in 05/2011    Each maintenance medication was reviewed in detail including most importantly the difference between maintenance and as needed and under what circumstances the prns are to be used.  Please see instructions for details which were reviewed in writing and the patient given a copy.

## 2011-08-20 NOTE — Assessment & Plan Note (Signed)
I took an extended  opportunity with this patient to outline the consequences of continued cigarette use  in airway disorders based on all the data we have from the multiple national lung health studies (perfomed over decades at millions of dollars in cost)  indicating that smoking cessation, not choice of inhalers or physicians, is the most important aspect of care.   

## 2011-09-30 ENCOUNTER — Ambulatory Visit: Payer: Medicaid Other | Admitting: Internal Medicine

## 2011-10-14 ENCOUNTER — Ambulatory Visit: Payer: Medicaid Other | Admitting: Internal Medicine

## 2011-10-14 ENCOUNTER — Ambulatory Visit (INDEPENDENT_AMBULATORY_CARE_PROVIDER_SITE_OTHER): Payer: Medicaid Other | Admitting: Internal Medicine

## 2011-10-14 DIAGNOSIS — J449 Chronic obstructive pulmonary disease, unspecified: Secondary | ICD-10-CM

## 2011-10-14 LAB — PULMONARY FUNCTION TEST

## 2011-10-14 NOTE — Progress Notes (Signed)
PFT done today. 

## 2011-10-19 ENCOUNTER — Encounter: Payer: Self-pay | Admitting: Internal Medicine

## 2011-10-20 ENCOUNTER — Telehealth: Payer: Self-pay | Admitting: Internal Medicine

## 2011-10-20 NOTE — Telephone Encounter (Signed)
Spoke with pt and advised per Dr. Sherene Sires needs ov to discuss PFT's in 6 to 8 wks. Appt scheduled for 11/11/11 at 2:30 pm.

## 2011-10-26 ENCOUNTER — Encounter: Payer: Self-pay | Admitting: Internal Medicine

## 2011-11-11 ENCOUNTER — Telehealth: Payer: Self-pay | Admitting: Internal Medicine

## 2011-11-11 ENCOUNTER — Ambulatory Visit (INDEPENDENT_AMBULATORY_CARE_PROVIDER_SITE_OTHER): Payer: Medicaid Other | Admitting: Internal Medicine

## 2011-11-11 ENCOUNTER — Encounter: Payer: Self-pay | Admitting: Internal Medicine

## 2011-11-11 VITALS — BP 144/90 | HR 67 | Temp 98.1°F | Ht 69.0 in | Wt 165.0 lb

## 2011-11-11 DIAGNOSIS — J449 Chronic obstructive pulmonary disease, unspecified: Secondary | ICD-10-CM

## 2011-11-11 MED ORDER — BUDESONIDE-FORMOTEROL FUMARATE 160-4.5 MCG/ACT IN AERO: 2.0000 | INHALATION_SPRAY | Freq: Two times a day (BID) | RESPIRATORY_TRACT | Status: DC

## 2011-11-11 NOTE — Telephone Encounter (Signed)
Per OV note from MW from today--pt to try symbicort 160  2 puffs bid---pt is aware that this rx has been called into the pharmacy per pts request.

## 2011-11-11 NOTE — Progress Notes (Signed)
  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  HPI 05/25/2011 Initial pulmonary office eval cc doe x 2006 despite spiriva daily then placed on neb and much better.  Can walk 8-10 blocks slow paced without stopping but can't do hills or walk a a normal pace, mildly progressive since noted first in 2006.  Minimal am cough, no excess mucus.  Not much better on spiriva.  rec Stop smoking before it stops you Ok to nebulizer if you must up to every 4 hours as needed (or use the ventolin spray)    08/19/2011 f/u ov/Wert still smoking  cc Pt states breathing has improved since the last visit but only if uses the nebulizer before walking twice daily but . Sleeping ok without nocturnal  or early am exacerbation  of respiratory  c/o's or need for noct saba. Also denies any obvious fluctuation of symptoms with weather or environmental changes or other aggravating or alleviating factors except as outlined above  rec Remember to use the spiriva 1st thing in am but it's a smooth really deep drag   Only use your albuterol (ventolin)as a rescue medication to be used if you can't catch your breath by resting or doing a relaxed purse lip breathing pattern. The less you use it, the better it will work when you need it.    11/11/11 Wert/ fu ov still smoking with no change doe but overusing daytime saba up to 4.  Mod congested cough, mucoid, not purulent.   Pt denies any significant sore throat, dysphagia, itching, sneezing,  nasal congestion or excess/ purulent secretions,  fever, chills, sweats, unintended wt loss, pleuritic or exertional cp, hempoptysis, orthopnea pnd or leg swelling.    ROS  At present neg for  any significant sore throat, dysphagia, itching, sneezing,  nasal  congestion or excess/ purulent secretions,  fever, chills, sweats, unintended wt loss, pleuritic or exertional cp, hempoptysis, orthopnea pnd or leg swelling.  Also denies presyncope, palpitations, heartburn, abdominal pain, nausea, vomiting, diarrhea  or change in bowel or urinary habits, dysuria,hematuria,  rash, arthralgias, visual complaints, headache, numbness weakness or ataxia.              Objective:   Physical Exam  05/25/2011  Wt 151 >  08/19/2011  165 > 11/11/2011  165 disshevled amb chronically ill appearing amb wm nad HEENT mild turbinate edema.  Oropharynx no thrush or excess pnd or cobblestoning.  No JVD or cervical adenopathy. Mild accessory muscle hypertrophy. Trachea midline, nl thryroid. Chest was hyperinflated by percussion with diminished breath sounds and moderate increased exp time without wheeze. Hoover sign positive at mid inspiration. Regular rate and rhythm without murmur gallop or rub or increase P2 or edema.  Abd: no hsm, nl excursion. Ext warm without cyanosis or clubbing.     cxr 04/12/11  Stable chronic pulmonary hyperinflation and cardiomegaly.      Assessment & Plan:

## 2011-11-11 NOTE — Telephone Encounter (Signed)
Error.[name] [name] ° °

## 2011-11-11 NOTE — Patient Instructions (Addendum)
Symbicort 160 Take 2 puffs first thing in am and then another 2 puffs about 12 hours later.   Work on inhaler technique:  relax and gently blow all the way out then take a nice smooth deep breath back in, triggering the inhaler at same time you start breathing in.  Hold for up to 5 seconds if you can.  Rinse and gargle with water when done   If your mouth or throat starts to bother you,   I suggest you time the inhaler to your dental care and after using the inhaler(s) brush teeth and tongue with a baking soda containing toothpaste and when you rinse this out, gargle with it first to see if this helps your mouth and throat.    Only use your albuterol as a rescue medication to be used if you can't catch your breath by resting or doing a relaxed purse lip breathing pattern. The less you use it, the better it will work when you need it.  Plan A Symbicort/ Spiriva Plan B Ventolin hfa Plan C Nebulizer Plan D = Doctor, call if noticing need more of C Plan E = ER if if A thru D aren't working  Please schedule a follow up visit in 3 months but call sooner if needed

## 2011-11-12 ENCOUNTER — Encounter: Payer: Self-pay | Admitting: Internal Medicine

## 2011-11-12 NOTE — Assessment & Plan Note (Signed)
-   PFT's 10/14/11  FEV1  1.00 (33%) ratio 40 and 36% better p B2,  DLCO 56%  GOLD III but very significant reversibility   The proper method of use, as well as anticipated side effects, of this metered-dose inhaler are discussed and demonstrated to the patient. Improved to 75% so next step is add on symbicort 160 2bid and help him understand difference between maint and prns  See instructions for specific recommendations which were reviewed directly with the patient who was given a copy with highlighter outlining the key components.

## 2012-10-04 ENCOUNTER — Other Ambulatory Visit: Payer: Self-pay | Admitting: Internal Medicine

## 2012-10-09 ENCOUNTER — Other Ambulatory Visit: Payer: Self-pay | Admitting: Internal Medicine

## 2014-12-04 ENCOUNTER — Encounter: Payer: Self-pay | Admitting: Internal Medicine

## 2014-12-04 ENCOUNTER — Ambulatory Visit (INDEPENDENT_AMBULATORY_CARE_PROVIDER_SITE_OTHER): Payer: Medicare Other | Admitting: Internal Medicine

## 2014-12-04 VITALS — BP 142/82 | HR 71 | Ht 66.0 in | Wt 169.8 lb

## 2014-12-04 DIAGNOSIS — Z954 Presence of other heart-valve replacement: Secondary | ICD-10-CM

## 2014-12-04 DIAGNOSIS — Z952 Presence of prosthetic heart valve: Secondary | ICD-10-CM

## 2014-12-04 DIAGNOSIS — I10 Essential (primary) hypertension: Secondary | ICD-10-CM

## 2014-12-04 NOTE — Progress Notes (Addendum)
Cardiology Office Note   Date:  12/04/2014   ID:  [name] [name] 02-14-1946, MRN [id number]  PCP:  Philis Fendt, MD  Cardiologist:   Dorris Carnes, MD   Patient is a 69 yo who is referred for evaluation of AV      History of Present Illness: [name] [name] is a 69 y.o. male with history of AV disease, s/p replacement in 2007   denies CP  Breathing is getting better since quit smoking  Quit 2 wks ago.   Before couldn't walk 2 blocks  Now can walk a mile   Felt dizzy with smoking  Not now   No signif palpitaitons     Current Outpatient Prescriptions  Medication Sig Dispense Refill  . aspirin 325 MG tablet Take 325 mg by mouth. Every other day    . dextromethorphan-guaiFENesin (MUCINEX DM) 30-600 MG per 12 hr tablet Take 1 tablet by mouth every 12 (twelve) hours as needed.    . lovastatin (MEVACOR) 20 MG tablet Take 20 mg by mouth daily.      . methocarbamol (ROBAXIN) 500 MG tablet Take 500 mg by mouth 3 (three) times daily. Take 1 tab by mouth no more than 3 times a day as needed for pain or spasms in back     . multivitamin (THERAGRAN) per tablet Take 1 tablet by mouth daily.      . naproxen (NAPROSYN) 500 MG tablet Take 500 mg by mouth 2 (two) times daily with a meal.      . potassium chloride (KLOR-CON) 10 MEQ CR tablet Take 10 mEq by mouth daily.      Marland Kitchen VITAMIN E PO Take 1 capsule by mouth daily.     Marland Kitchen albuterol (PROVENTIL,VENTOLIN) 90 MCG/ACT inhaler Inhale 2 puffs into the lungs as directed. Use as directed as needed (Patient taking differently: Inhale 2 puffs into the lungs daily. Use as directed as needed) 17 g 12  . budesonide-formoterol (SYMBICORT) 160-4.5 MCG/ACT inhaler Inhale 2 puffs into the lungs 2 (two) times daily. 1 Inhaler 11   No current facility-administered medications for this visit.    Allergies:   Review of patient's allergies indicates no known allergies.   Past Medical History  Diagnosis Date  . COPD (chronic obstructive pulmonary disease)      FEV1/FVC -> 0.52 (69% of predicted)  . Aortic stenosis     s/p bioprosthetic valve replacement  . Osteoarthritis   . Cardiomyopathy 2006    EF 30%; has history of CHF  . Tobacco abuse     Past Surgical History  Procedure Laterality Date  . Aortic valve replacement      bioprosthetic, 01/2006 - cannot find record of surgery (has scar)  . Tonsillectomy       Social History:  The patient  reports that he has been smoking Cigarettes.  He has a 12.5 pack-year smoking history. He has never used smokeless tobacco. He reports that he does not drink alcohol or use illicit drugs.   Family History:  The patient's family history includes Cancer in his brother and mother; Coronary artery disease in his father; Heart failure in his father.    ROS:  Please see the history of present illness. All other systems are reviewed and  Negative to the above problem except as noted.    PHYSICAL EXAM: VS:  BP 142/82 mmHg  Pulse 71  Ht 5\' 6"  (1.676 m)  Wt 169 lb 12.8 oz (77.021 kg)  BMI 27.42  kg/m2  GEN: Well nourished, well developed, in no acute distress HEENT: normal Neck: no JVD, carotid bruits, or masses Cardiac: RRR;  II/VI systolic murmurrubs, or gallops,no edema  Respiratory:  clear to auscultation bilaterally, normal work of breathing GI: soft, nontender, nondistended, + BS  No hepatomegaly  MS: no deformity Moving all extremities   Skin: warm and dry, no rash Neuro:  Strength and sensation are intact Psych: euthymic mood, full affect   EKG:  EKG is ordered today.  SR 80 bpm  PR interval 210 Occasional PVC  ST depreestion with biphasic T waves in inferior and lateral leads     Lipid Panel    Component Value Date/Time   CHOL 177 06/21/2010 2150   TRIG 42 06/21/2010 2150   HDL 76 06/21/2010 2150   CHOLHDL 2.3 Ratio 06/21/2010 2150   VLDL 8 06/21/2010 2150   LDLCALC 93 06/21/2010 2150      Wt Readings from Last 3 Encounters:  12/04/14 169 lb 12.8 oz (77.021 kg)  11/11/11 165 lb  (74.844 kg)  08/19/11 165 lb (74.844 kg)      ASSESSMENT AND PLAN: 1.  AV disease  Will set up for echo to evaluate AV prostheis and aorta    2.  HL  Patinet stopped lovasatin.  LDL on recent check was 147  Should be lower  Will discuss after echo      Current medicines are reviewed at length with the patient today.  The patient does not have concerns regarding medicines.  The following changes have been made:   Labs/ tests ordered today include:  Echo  Signed, Dorris Carnes, MD  12/04/2014 3:36 PM    [address], [place], [place]  [address] Phone: [phone]; Fax: [phone]

## 2014-12-04 NOTE — Patient Instructions (Signed)
Your physician recommends that you continue on your current medications as directed. Please refer to the Current Medication list given to you today.  Your physician has requested that you have an echocardiogram. Echocardiography is a painless test that uses sound waves to create images of your heart. It provides your doctor with information about the size and shape of your heart and how well your heart's chambers and valves are working. This procedure takes approximately one hour. There are no restrictions for this procedure.  Your physician wants you to follow-up in: 1 year with Dr. Ross.  You will receive a reminder letter in the mail two months in advance. If you don't receive a letter, please call our office to schedule the follow-up appointment.  

## 2014-12-18 ENCOUNTER — Telehealth: Payer: Self-pay | Admitting: *Deleted

## 2014-12-18 NOTE — Telephone Encounter (Signed)
Echo is scheduled for 12/26/14.

## 2014-12-18 NOTE — Telephone Encounter (Signed)
-----   Message from Fay Records, MD sent at 12/15/2014  9:11 PM EDT ----- Pt to sched echo to evaluate aortic valve With hx of heart surgery in past he should be on statin to avoid further heart problems Would recomm lipitor 10 mg if he has not tried   F/U lpids in 2 months   ----- Message -----    From: Fay Records, MD    Sent: 12/06/2014   5:21 PM      To: Fay Records, MD

## 2014-12-22 MED ORDER — ATORVASTATIN CALCIUM 10 MG PO TABS: 10.0000 mg | ORAL_TABLET | Freq: Every day | ORAL | Status: DC

## 2014-12-22 NOTE — Telephone Encounter (Signed)
Pt will start lipitor 10 mg daily.  He had taken lovastatin in the past but stopped in 2014 due to a program on TV that said statins can cause brain damage and he was having short and long term memory issues at that time which have resolved.    Requests a refill on his Pro Air inhaler.  Advised if Dr. Harrington Challenger agrees to fill this I will send to [facility]

## 2014-12-26 ENCOUNTER — Other Ambulatory Visit (HOSPITAL_COMMUNITY): Payer: Medicare Other

## 2015-01-02 ENCOUNTER — Ambulatory Visit (HOSPITAL_COMMUNITY): Payer: Medicare Other | Attending: Cardiology | Admitting: Cardiology

## 2015-01-02 DIAGNOSIS — I10 Essential (primary) hypertension: Secondary | ICD-10-CM

## 2015-01-02 DIAGNOSIS — Z954 Presence of other heart-valve replacement: Secondary | ICD-10-CM | POA: Diagnosis not present

## 2015-01-02 DIAGNOSIS — Z952 Presence of prosthetic heart valve: Secondary | ICD-10-CM

## 2015-01-02 NOTE — Progress Notes (Signed)
Echo performed. 

## 2015-01-14 ENCOUNTER — Telehealth: Payer: Self-pay | Admitting: *Deleted

## 2015-01-14 DIAGNOSIS — I77819 Aortic ectasia, unspecified site: Secondary | ICD-10-CM

## 2015-01-14 NOTE — Telephone Encounter (Signed)
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 Would recomm CT of chest to define aorta in 3 dimension. See how large it is     INFORMED PATIENT. VERBALIZES UNDERSTANDING. STAFF MESSG SENT TO [place] TO CALL AND SCHEDULE CT AND LAB WORK.

## 2015-01-16 ENCOUNTER — Other Ambulatory Visit (INDEPENDENT_AMBULATORY_CARE_PROVIDER_SITE_OTHER): Payer: Medicare Other | Admitting: *Deleted

## 2015-01-16 DIAGNOSIS — I77819 Aortic ectasia, unspecified site: Secondary | ICD-10-CM | POA: Diagnosis not present

## 2015-01-16 LAB — BASIC METABOLIC PANEL
CO2: 32 mEq/L (ref 19–32)
Calcium: 9 mg/dL (ref 8.4–10.5)
Chloride: 103 mEq/L (ref 96–112)
Creatinine, Ser: 1.14 mg/dL (ref 0.40–1.50)
GFR: 68.05 mL/min (ref >60.00)
Glucose, Bld: 100 mg/dL — ABNORMAL HIGH (ref 70–99)
Potassium: 3.7 mEq/L (ref 3.5–5.1)
Sodium: 138 mEq/L (ref 135–145)

## 2015-01-21 ENCOUNTER — Ambulatory Visit (INDEPENDENT_AMBULATORY_CARE_PROVIDER_SITE_OTHER)
Admission: RE | Admit: 2015-01-21 | Discharge: 2015-01-21 | Disposition: A | Discharge status: Home / Self Care | Payer: Medicare Other | Source: Ambulatory Visit | Attending: Internal Medicine | Admitting: Internal Medicine

## 2015-01-21 DIAGNOSIS — I77819 Aortic ectasia, unspecified site: Secondary | ICD-10-CM | POA: Diagnosis not present

## 2015-01-21 MED ORDER — IOHEXOL 350 MG/ML SOLN
100.0000 mL | Freq: Once | INTRAVENOUS | Status: AC | PRN
  Administered 2015-01-21: 100 mL via INTRAVENOUS

## 2015-02-24 ENCOUNTER — Other Ambulatory Visit: Payer: Self-pay | Admitting: *Deleted

## 2015-02-24 DIAGNOSIS — I359 Nonrheumatic aortic valve disorder, unspecified: Secondary | ICD-10-CM

## 2015-07-23 ENCOUNTER — Other Ambulatory Visit: Payer: Self-pay | Admitting: *Deleted

## 2015-07-23 DIAGNOSIS — Z01812 Encounter for preprocedural laboratory examination: Secondary | ICD-10-CM

## 2015-08-14 ENCOUNTER — Other Ambulatory Visit (INDEPENDENT_AMBULATORY_CARE_PROVIDER_SITE_OTHER): Payer: Medicare Other | Admitting: *Deleted

## 2015-08-14 DIAGNOSIS — Z01812 Encounter for preprocedural laboratory examination: Secondary | ICD-10-CM

## 2015-08-14 LAB — BASIC METABOLIC PANEL
BUN: 13 mg/dL (ref 7–25)
CO2: 27 mmol/L (ref 20–31)
Calcium: 9.4 mg/dL (ref 8.6–10.3)
Chloride: 107 mmol/L (ref 98–110)
Creat: 1.04 mg/dL (ref 0.70–1.25)
Glucose, Bld: 83 mg/dL (ref 65–99)
Potassium: 4.3 mmol/L (ref 3.5–5.3)
Sodium: 143 mmol/L (ref 135–146)

## 2015-08-14 NOTE — Addendum Note (Signed)
Addended by: Eulis Foster on: 08/14/2015 11:08 AM   Modules accepted: Orders

## 2015-08-21 ENCOUNTER — Ambulatory Visit (INDEPENDENT_AMBULATORY_CARE_PROVIDER_SITE_OTHER)
Admission: RE | Admit: 2015-08-21 | Discharge: 2015-08-21 | Disposition: A | Discharge status: Home / Self Care | Payer: Medicare Other | Source: Ambulatory Visit | Attending: Internal Medicine | Admitting: Internal Medicine

## 2015-08-21 DIAGNOSIS — I359 Nonrheumatic aortic valve disorder, unspecified: Secondary | ICD-10-CM | POA: Diagnosis not present

## 2015-08-21 MED ORDER — IOHEXOL 350 MG/ML SOLN
100.0000 mL | Freq: Once | INTRAVENOUS | Status: AC | PRN
  Administered 2015-08-21: 100 mL via INTRAVENOUS

## 2016-01-22 ENCOUNTER — Encounter: Payer: Self-pay | Admitting: Internal Medicine

## 2016-01-22 ENCOUNTER — Ambulatory Visit (INDEPENDENT_AMBULATORY_CARE_PROVIDER_SITE_OTHER): Payer: Medicare Other | Admitting: Internal Medicine

## 2016-01-22 VITALS — BP 128/80 | HR 78 | Ht 66.0 in | Wt 164.8 lb

## 2016-01-22 DIAGNOSIS — E785 Hyperlipidemia, unspecified: Secondary | ICD-10-CM | POA: Diagnosis not present

## 2016-01-22 DIAGNOSIS — I712 Thoracic aortic aneurysm, without rupture, unspecified: Secondary | ICD-10-CM

## 2016-01-22 LAB — LIPID PANEL
Cholesterol: 242 mg/dL — ABNORMAL HIGH (ref 125–200)
HDL: 67 mg/dL (ref >=40)
LDL Cholesterol: 153 mg/dL — ABNORMAL HIGH (ref <130)
Total CHOL/HDL Ratio: 3.6 Ratio (ref <=5.0)
VLDL: 22 mg/dL (ref <30)

## 2016-01-22 LAB — BASIC METABOLIC PANEL
BUN: 14 mg/dL (ref 7–25)
Calcium: 9 mg/dL (ref 8.6–10.3)
Chloride: 98 mmol/L (ref 98–110)
Creat: 1.17 mg/dL (ref 0.70–1.25)
Glucose, Bld: 92 mg/dL (ref 65–99)
Potassium: 3.8 mmol/L (ref 3.5–5.3)
Sodium: 133 mmol/L — ABNORMAL LOW (ref 135–146)

## 2016-01-22 LAB — CBC
HCT: 42.8 % (ref 38.5–50.0)
Hemoglobin: 14.4 g/dL (ref 13.2–17.1)
MCH: 29.8 pg (ref 27.0–33.0)
MCHC: 33.6 g/dL (ref 32.0–36.0)
MCV: 88.6 fL (ref 80.0–100.0)
MPV: 12.4 fL (ref 7.5–12.5)
Platelets: 192 10*3/uL (ref 140–400)
RBC: 4.83 MIL/uL (ref 4.20–5.80)
RDW: 13.7 % (ref 11.0–15.0)
WBC: 7.8 10*3/uL (ref 3.8–10.8)

## 2016-01-22 NOTE — Progress Notes (Signed)
Cardiology Office Note   Date:  01/22/2016   ID:  [name], [name] [name] 02, 1947, MRN [id number]  PCP:  Philis Fendt, MD  Cardiologist:   Dorris Carnes, MD   F/U of AV dz      History of Present Illness: [name] [name] is a 70 y.o. male with a history of AV disease, s/p replacement in 2007 Pt was seen in clinci 1 year ago  Echo LVEF 45 to 50%  Breathing better  Usies mucinex   No CP   Quit tob a whle ago   No dizzienss No swelling  No syncope    One spell 3  Months ago at [employer]  Had been smoking  Things got "weird"  Did not pass out  None since   Outpatient Prescriptions Prior to Visit  Medication Sig Dispense Refill  . albuterol (PROVENTIL,VENTOLIN) 90 MCG/ACT inhaler Inhale 2 puffs into the lungs as directed. Use as directed as needed (Patient taking differently: Inhale 2 puffs into the lungs daily. Use as directed as needed) 17 g 12  . aspirin 325 MG tablet Take 325 mg by mouth. Every other day    . atorvastatin (LIPITOR) 10 MG tablet Take 1 tablet (10 mg total) by mouth daily. 30 tablet 11  . budesonide-formoterol (SYMBICORT) 160-4.5 MCG/ACT inhaler Inhale 2 puffs into the lungs 2 (two) times daily. 1 Inhaler 11  . dextromethorphan-guaiFENesin (MUCINEX DM) 30-600 MG per 12 hr tablet Take 1 tablet by mouth every 12 (twelve) hours as needed.    . methocarbamol (ROBAXIN) 500 MG tablet Take 500 mg by mouth 3 (three) times daily. Take 1 tab by mouth no more than 3 times a day as needed for pain or spasms in back     . multivitamin (THERAGRAN) per tablet Take 1 tablet by mouth daily.      . naproxen (NAPROSYN) 500 MG tablet Take 500 mg by mouth 2 (two) times daily with a meal.      . potassium chloride (KLOR-CON) 10 MEQ CR tablet Take 10 mEq by mouth daily.      Marland Kitchen VITAMIN E PO Take 1 capsule by mouth daily.      No facility-administered medications prior to visit.     Allergies:   Review of patient's allergies indicates no known allergies.   Past Medical History   Diagnosis Date  . COPD (chronic obstructive pulmonary disease) (HCC)     FEV1/FVC -> 0.52 (69% of predicted)  . Aortic stenosis     s/p bioprosthetic valve replacement  . Osteoarthritis   . Cardiomyopathy 2006    EF 30%; has history of CHF  . Tobacco abuse     Past Surgical History  Procedure Laterality Date  . Aortic valve replacement      bioprosthetic, 01/2006 - cannot find record of surgery (has scar)  . Tonsillectomy       Social History:  The patient  reports that he has quit smoking. His smoking use included Cigarettes. He has a 12.5 pack-year smoking history. He has never used smokeless tobacco. He reports that he does not drink alcohol or use illicit drugs.   Family History:  The patient's family history includes Cancer in his brother and mother; Coronary artery disease in his father; Heart failure in his father.    ROS:  Please see the history of present illness. All other systems are reviewed and  Negative to the above problem except as noted.    PHYSICAL EXAM: VS:  BP 128/80 mmHg  Pulse 78  Ht 5\' 6"  (1.676 m)  Wt 164 lb 12.8 oz (74.753 kg)  BMI 26.61 kg/m2  GEN: Well nourished, well developed, in no acute distress HEENT: normal Neck: no JVD, carotid bruits, or masses Cardiac: RRR; no murmurs, rubs, or gallops,no edema  Respiratory:  clear to auscultation bilaterally, normal work of breathing GI: soft, nontender, nondistended, + BS  No hepatomegaly  MS: no deformity Moving all extremities   Skin: warm and dry, no rash Neuro:  Strength and sensation are intact Psych: euthymic mood, full affect   EKG:  EKG is ordered today.  SR 77 First degree AV block  PR 218 msec   LBBB     Lipid Panel    Component Value Date/Time   CHOL 177 06/21/2010 2150   TRIG 42 06/21/2010 2150   HDL 76 06/21/2010 2150   CHOLHDL 2.3 Ratio 06/21/2010 2150   VLDL 8 06/21/2010 2150   LDLCALC 93 06/21/2010 2150      Wt Readings from Last 3 Encounters:  01/22/16 164 lb 12.8 oz  (74.753 kg)  12/04/14 169 lb 12.8 oz (77.021 kg)  11/11/11 165 lb (74.844 kg)      ASSESSMENT AND PLAN:  1  AV dz  Exam good     2 Aortic aneurym  Repeat CT in May  Or [name]   3  HL  Check  Lipids    4  Dizziness  / syncope  Follow  BP OK  No plans for testing now   Will check CBC and BMET    Signed, Dorris Carnes, MD  01/22/2016 9:43 AM    [address], [place],   [address] Phone: [phone]; Fax: [phone]

## 2016-01-22 NOTE — Patient Instructions (Signed)
Your physician recommends that you continue on your current medications as directed. Please refer to the Current Medication list given to you today. Your physician recommends that you return for lab work in: today (BMET, CBC, LIPIDS)  Non-Cardiac CT scanning, (CAT scanning), is a noninvasive, special x-ray that produces cross-sectional images of the body using x-rays and a computer. CT scans help physicians diagnose and treat medical conditions. For some CT exams, a contrast material is used to enhance visibility in the area of the body being studied. CT scans provide greater clarity and reveal more details than regular x-ray exams. Schedule for in about 5-6 weeks.  Your physician wants you to follow-up in: NEXT MARCH, 2018 [place].  You will receive a reminder letter in the mail two months in advance. If you don't receive a letter, please call our office to schedule the follow-up appointment.\

## 2016-01-26 ENCOUNTER — Other Ambulatory Visit: Payer: Self-pay | Admitting: *Deleted

## 2016-01-26 DIAGNOSIS — E785 Hyperlipidemia, unspecified: Secondary | ICD-10-CM

## 2016-01-26 MED ORDER — ATORVASTATIN CALCIUM 40 MG PO TABS: 40.0000 mg | ORAL_TABLET | Freq: Every day | ORAL | Status: DC

## 2016-02-26 ENCOUNTER — Ambulatory Visit (INDEPENDENT_AMBULATORY_CARE_PROVIDER_SITE_OTHER)
Admission: RE | Admit: 2016-02-26 | Discharge: 2016-02-26 | Disposition: A | Discharge status: Home / Self Care | Payer: Medicare Other | Source: Ambulatory Visit | Attending: Internal Medicine | Admitting: Internal Medicine

## 2016-02-26 DIAGNOSIS — I712 Thoracic aortic aneurysm, without rupture, unspecified: Secondary | ICD-10-CM

## 2016-02-26 DIAGNOSIS — E785 Hyperlipidemia, unspecified: Secondary | ICD-10-CM | POA: Diagnosis not present

## 2016-02-26 MED ORDER — IOPAMIDOL (ISOVUE-370) INJECTION 76%
100.0000 mL | Freq: Once | INTRAVENOUS | Status: AC | PRN
  Administered 2016-02-26: 100 mL via INTRAVENOUS

## 2016-03-08 ENCOUNTER — Telehealth: Payer: Self-pay | Admitting: *Deleted

## 2016-03-08 DIAGNOSIS — I712 Thoracic aortic aneurysm, without rupture, unspecified: Secondary | ICD-10-CM

## 2016-03-08 NOTE — Telephone Encounter (Signed)
Message     Aorta is stable Set f/u CT of chest for 1 year     Informed patient. He verbalizes understanding and appreciation for the call.

## 2016-03-18 ENCOUNTER — Other Ambulatory Visit (INDEPENDENT_AMBULATORY_CARE_PROVIDER_SITE_OTHER): Payer: Medicare Other | Admitting: *Deleted

## 2016-03-18 DIAGNOSIS — E785 Hyperlipidemia, unspecified: Secondary | ICD-10-CM

## 2016-03-18 LAB — LIPID PANEL
Cholesterol: 179 mg/dL (ref 125–200)
HDL: 80 mg/dL (ref >=40)
LDL Cholesterol: 89 mg/dL (ref <130)
Total CHOL/HDL Ratio: 2.2 Ratio (ref <=5.0)
Triglycerides: 51 mg/dL (ref <150)
VLDL: 10 mg/dL (ref <30)

## 2016-03-18 NOTE — Addendum Note (Signed)
Addended by: Eulis Foster on: 03/18/2016 10:16 AM   Modules accepted: Orders

## 2016-12-29 NOTE — Progress Notes (Addendum)
Cardiology Office Note   Date:  12/30/2016   ID:  [name], Vangorder 09/12/1946, MRN [id number]  PCP:  Philis Fendt, MD  Cardiologist:   [name] Carnes, MD    F/U of AV dz     History of Present Illness: [name] [name] is a 71 y.o. male with a history of AV dz  (s/p AVR in 2007)   I sawa the pt  In April 2017 The pt notes that he gives out easer  Gets SOB  No energy   Denies CP       Current Meds  Medication Sig  . aspirin 325 MG tablet Take 325 mg by mouth. Every other day  . atorvastatin (LIPITOR) 40 MG tablet Take 1 tablet (40 mg total) by mouth daily.  . COMBIVENT RESPIMAT 20-100 MCG/ACT AERS respimat Take 2 puffs by mouth as directed.  Marland Kitchen dextromethorphan-guaiFENesin (MUCINEX DM) 30-600 MG per 12 hr tablet Take 1 tablet by mouth every 12 (twelve) hours as needed.  . furosemide (LASIX) 40 MG tablet Take 1 tablet by mouth daily.  . methocarbamol (ROBAXIN) 500 MG tablet Take 500 mg by mouth 3 (three) times daily. Take 1 tab by mouth no more than 3 times a day as needed for pain or spasms in back   . multivitamin (THERAGRAN) per tablet Take 1 tablet by mouth daily.    . naproxen (NAPROSYN) 500 MG tablet Take 500 mg by mouth 2 (two) times daily with a meal.    . potassium chloride (KLOR-CON) 10 MEQ CR tablet Take 10 mEq by mouth daily.    . pramipexole (MIRAPEX) 0.125 MG tablet Take 1 tablet by mouth daily as needed.  . VENTOLIN HFA 108 (90 Base) MCG/ACT inhaler Take 2 puffs by mouth as needed for shortness of breath.  Marland Kitchen VITAMIN E PO Take 1 capsule by mouth daily.      Allergies:   Patient has no known allergies.   Past Medical History:  Diagnosis Date  . Aortic stenosis    s/p bioprosthetic valve replacement  . Cardiomyopathy 2006   EF 30%; has history of CHF  . COPD (chronic obstructive pulmonary disease) (HCC)    FEV1/FVC -> 0.52 (69% of predicted)  . Osteoarthritis   . Tobacco abuse     Past Surgical History:  Procedure Laterality Date  . AORTIC VALVE  REPLACEMENT     bioprosthetic, 01/2006 - cannot find record of surgery (has scar)  . TONSILLECTOMY       Social History:  The patient  reports that he has quit smoking. His smoking use included Cigarettes. He has a 12.50 pack-year smoking history. He has never used smokeless tobacco. He reports that he does not drink alcohol or use drugs.   Family History:  The patient's family history includes Cancer in his brother and mother; Coronary artery disease in his father; Heart failure in his father.    ROS:  Please see the history of present illness. All other systems are reviewed and  Negative to the above problem except as noted.    PHYSICAL EXAM: VS:  BP 124/74   Pulse 63   Ht 5\' 6"  (1.676 m)   Wt 167 lb (75.8 kg)   BMI 26.95 kg/m    On arrival sat 86% (RA)  Then 96%  With walking later sat went down to 90% GEN: Well nourished, well developed, in no acute distress  HEENT: normal  Neck: no JVD, carotid bruits, or masses Cardiac: RRR;  II/VI systolic murmur atbase  , rubs, or gallops,no edema  Respiratory:  clear to auscultation bilaterally, normal work of breathing GI: soft, nontender, nondistended, + BS  No hepatomegaly  MS: no deformity Moving all extremities   Skin: warm and dry, no rash  Pale Neuro:  Strength and sensation are intact Psych: euthymic mood, full affect   EKG:  EKG is ordered today. SR  63 bpm  First degree AV block  PR 216 msec  LBBB   Lipid Panel    Component Value Date/Time   CHOL 173 12/30/2016 1054   TRIG 72 12/30/2016 1054   HDL 66 12/30/2016 1054   CHOLHDL 2.6 12/30/2016 1054   CHOLHDL 2.2 03/18/2016 1016   VLDL 10 03/18/2016 1016   LDLCALC 93 12/30/2016 1054      Wt Readings from Last 3 Encounters:  12/30/16 167 lb (75.8 kg)  01/22/16 164 lb 12.8 oz (74.8 kg)  12/04/14 169 lb 12.8 oz (77 kg)      ASSESSMENT AND PLAN:  1  Dyspnea.  Pt ddid not drop below 90 % on walking later in clinic   He needs to have this followed  May need Oxygen    For now would check CBC and TSH and electrolyes  Set up for echo  Forward to Dr Jeanie Cooks    2  AV dz  s/p AVR  Repeat echo    3  HL  Continue lipitor    Tentative f/u in 1 year  Will review test results     Current medicines are reviewed at length with the patient today.  The patient does not have concerns regarding medicines.  Signed, [name] Carnes, MD  12/30/2016 3:59 PM    [facility] [address], [place], [place]  [address] Phone: [phone]; Fax: [phone]

## 2016-12-30 ENCOUNTER — Encounter (HOSPITAL_COMMUNITY): Payer: Self-pay

## 2016-12-30 ENCOUNTER — Inpatient Hospital Stay (HOSPITAL_COMMUNITY)
Admission: EM | Admit: 2016-12-30 | Discharge: 2017-01-01 | DRG: 394 | Disposition: A | Discharge status: Home / Self Care | Payer: Medicare Other | Attending: Internal Medicine | Admitting: Internal Medicine

## 2016-12-30 ENCOUNTER — Inpatient Hospital Stay (HOSPITAL_COMMUNITY): Payer: Medicare Other

## 2016-12-30 ENCOUNTER — Encounter (INDEPENDENT_AMBULATORY_CARE_PROVIDER_SITE_OTHER): Payer: Self-pay

## 2016-12-30 ENCOUNTER — Encounter: Payer: Self-pay | Admitting: Internal Medicine

## 2016-12-30 ENCOUNTER — Ambulatory Visit (INDEPENDENT_AMBULATORY_CARE_PROVIDER_SITE_OTHER): Payer: Medicare Other | Admitting: Internal Medicine

## 2016-12-30 VITALS — BP 124/74 | HR 63 | Ht 66.0 in | Wt 167.0 lb

## 2016-12-30 DIAGNOSIS — D649 Anemia, unspecified: Secondary | ICD-10-CM | POA: Diagnosis present

## 2016-12-30 DIAGNOSIS — I110 Hypertensive heart disease with heart failure: Secondary | ICD-10-CM | POA: Diagnosis present

## 2016-12-30 DIAGNOSIS — K922 Gastrointestinal hemorrhage, unspecified: Secondary | ICD-10-CM | POA: Diagnosis present

## 2016-12-30 DIAGNOSIS — E785 Hyperlipidemia, unspecified: Secondary | ICD-10-CM | POA: Diagnosis present

## 2016-12-30 DIAGNOSIS — K449 Diaphragmatic hernia without obstruction or gangrene: Secondary | ICD-10-CM | POA: Diagnosis present

## 2016-12-30 DIAGNOSIS — R0602 Shortness of breath: Secondary | ICD-10-CM | POA: Diagnosis present

## 2016-12-30 DIAGNOSIS — I10 Essential (primary) hypertension: Secondary | ICD-10-CM | POA: Diagnosis not present

## 2016-12-30 DIAGNOSIS — R0600 Dyspnea, unspecified: Secondary | ICD-10-CM | POA: Diagnosis not present

## 2016-12-30 DIAGNOSIS — I429 Cardiomyopathy, unspecified: Secondary | ICD-10-CM | POA: Diagnosis present

## 2016-12-30 DIAGNOSIS — M1990 Unspecified osteoarthritis, unspecified site: Secondary | ICD-10-CM | POA: Diagnosis present

## 2016-12-30 DIAGNOSIS — D62 Acute posthemorrhagic anemia: Secondary | ICD-10-CM | POA: Diagnosis present

## 2016-12-30 DIAGNOSIS — Z79899 Other long term (current) drug therapy: Secondary | ICD-10-CM | POA: Diagnosis not present

## 2016-12-30 DIAGNOSIS — K921 Melena: Secondary | ICD-10-CM | POA: Diagnosis present

## 2016-12-30 DIAGNOSIS — Z808 Family history of malignant neoplasm of other organs or systems: Secondary | ICD-10-CM | POA: Diagnosis not present

## 2016-12-30 DIAGNOSIS — I359 Nonrheumatic aortic valve disorder, unspecified: Secondary | ICD-10-CM | POA: Diagnosis not present

## 2016-12-30 DIAGNOSIS — Z8249 Family history of ischemic heart disease and other diseases of the circulatory system: Secondary | ICD-10-CM

## 2016-12-30 DIAGNOSIS — Z953 Presence of xenogenic heart valve: Secondary | ICD-10-CM

## 2016-12-30 DIAGNOSIS — I5042 Chronic combined systolic (congestive) and diastolic (congestive) heart failure: Secondary | ICD-10-CM | POA: Diagnosis present

## 2016-12-30 DIAGNOSIS — J441 Chronic obstructive pulmonary disease with (acute) exacerbation: Secondary | ICD-10-CM | POA: Diagnosis present

## 2016-12-30 DIAGNOSIS — G2581 Restless legs syndrome: Secondary | ICD-10-CM | POA: Diagnosis present

## 2016-12-30 DIAGNOSIS — K648 Other hemorrhoids: Principal | ICD-10-CM | POA: Diagnosis present

## 2016-12-30 DIAGNOSIS — Z87891 Personal history of nicotine dependence: Secondary | ICD-10-CM

## 2016-12-30 DIAGNOSIS — K640 First degree hemorrhoids: Secondary | ICD-10-CM | POA: Diagnosis present

## 2016-12-30 HISTORY — DX: Heart failure, unspecified: I50.9

## 2016-12-30 LAB — COMPREHENSIVE METABOLIC PANEL
ALT: 18 U/L (ref 17–63)
AST: 21 U/L (ref 15–41)
Albumin: 3.8 g/dL (ref 3.5–5.0)
Alkaline Phosphatase: 37 U/L — ABNORMAL LOW (ref 38–126)
Anion gap: 8 (ref 5–15)
BUN: 14 mg/dL (ref 6–20)
CO2: 24 mmol/L (ref 22–32)
Calcium: 9.1 mg/dL (ref 8.9–10.3)
Chloride: 106 mmol/L (ref 101–111)
Creatinine, Ser: 1.17 mg/dL (ref 0.61–1.24)
GFR calc Af Amer: >60 mL/min (ref >60)
GFR calc non Af Amer: >60 mL/min (ref >60)
Glucose, Bld: 114 mg/dL — ABNORMAL HIGH (ref 65–99)
Potassium: 4.5 mmol/L (ref 3.5–5.1)
Total Bilirubin: 0.7 mg/dL (ref 0.3–1.2)
Total Protein: 6.4 g/dL — ABNORMAL LOW (ref 6.5–8.1)

## 2016-12-30 LAB — BASIC METABOLIC PANEL
BUN/Creatinine Ratio: 16 (ref 10–24)
BUN: 16 mg/dL (ref 8–27)
CO2: 20 mmol/L (ref 18–29)
Calcium: 8.5 mg/dL — ABNORMAL LOW (ref 8.6–10.2)
Chloride: 102 mmol/L (ref 96–106)
Creatinine, Ser: 0.98 mg/dL (ref 0.76–1.27)
GFR calc Af Amer: 91 mL/min/{1.73_m2} (ref >59)
GFR calc non Af Amer: 78 mL/min/{1.73_m2} (ref >59)
Glucose: 101 mg/dL — ABNORMAL HIGH (ref 65–99)
Potassium: 4.5 mmol/L (ref 3.5–5.2)
Sodium: 139 mmol/L (ref 134–144)

## 2016-12-30 LAB — LIPID PANEL
Chol/HDL Ratio: 2.6 ratio units (ref 0.0–5.0)
Cholesterol, Total: 173 mg/dL (ref 100–199)
HDL: 66 mg/dL (ref >39)
LDL Calculated: 93 mg/dL (ref 0–99)
Triglycerides: 72 mg/dL (ref 0–149)
VLDL Cholesterol Cal: 14 mg/dL (ref 5–40)

## 2016-12-30 LAB — CBC
HCT: 20.5 % — ABNORMAL LOW (ref 39.0–52.0)
Hematocrit: 20.7 % — ABNORMAL LOW (ref 37.5–51.0)
Hemoglobin: 6.5 g/dL — CL (ref 13.0–17.7)
Hemoglobin: 6.5 g/dL — CL (ref 13.0–17.0)
MCH: 25.7 pg — ABNORMAL LOW (ref 26.0–34.0)
MCHC: 31.4 g/dL — ABNORMAL LOW (ref 31.5–35.7)
MCHC: 31.7 g/dL (ref 30.0–36.0)
MCV: 81 fL (ref 79–97)
MCV: 81 fL (ref 78.0–100.0)
Platelets: 261 10*3/uL (ref 150–400)
RBC: 2.57 x10E6/uL — CL (ref 4.14–5.80)
RBC: 2.53 MIL/uL — ABNORMAL LOW (ref 4.22–5.81)
RDW: 15.4 % (ref 12.3–15.4)
RDW: 15.2 % (ref 11.5–15.5)
WBC: 7.9 10*3/uL (ref 3.4–10.8)

## 2016-12-30 LAB — POC OCCULT BLOOD, ED: Fecal Occult Bld: POSITIVE — AB

## 2016-12-30 LAB — TSH: TSH: 1.6 u[IU]/mL (ref 0.450–4.500)

## 2016-12-30 LAB — ABO/RH: ABO/RH(D): A POS

## 2016-12-30 LAB — PREPARE RBC (CROSSMATCH)

## 2016-12-30 MED ORDER — ACETAMINOPHEN 650 MG RE SUPP: 650.0000 mg | Freq: Four times a day (QID) | RECTAL | Status: DC | PRN

## 2016-12-30 MED ORDER — IPRATROPIUM-ALBUTEROL 0.5-2.5 (3) MG/3ML IN SOLN
3.0000 mL | RESPIRATORY_TRACT | Status: DC | PRN
  Administered 2016-12-30: 3 mL via RESPIRATORY_TRACT

## 2016-12-30 MED ORDER — IPRATROPIUM-ALBUTEROL 0.5-2.5 (3) MG/3ML IN SOLN
3.0000 mL | RESPIRATORY_TRACT | Status: AC
  Filled 2016-12-30: qty 3

## 2016-12-30 MED ORDER — ACETAMINOPHEN 325 MG PO TABS
650.0000 mg | ORAL_TABLET | Freq: Four times a day (QID) | ORAL | Status: DC | PRN
  Administered 2016-12-31: 650 mg via ORAL
  Filled 2016-12-30: qty 2

## 2016-12-30 MED ORDER — SODIUM CHLORIDE 0.9 % IV SOLN
Freq: Once | INTRAVENOUS | Status: AC
  Administered 2016-12-30: 10 mL via INTRAVENOUS

## 2016-12-30 MED ORDER — ARFORMOTEROL TARTRATE 15 MCG/2ML IN NEBU
15.0000 ug | INHALATION_SOLUTION | Freq: Two times a day (BID) | RESPIRATORY_TRACT | Status: DC
  Administered 2016-12-30 – 2017-01-01 (×4): 15 ug via RESPIRATORY_TRACT
  Filled 2016-12-30 (×4): qty 2

## 2016-12-30 MED ORDER — ONDANSETRON HCL 4 MG/2ML IJ SOLN: 4.0000 mg | Freq: Four times a day (QID) | INTRAMUSCULAR | Status: DC | PRN

## 2016-12-30 MED ORDER — SODIUM CHLORIDE 0.9 % IV SOLN
10.0000 mL/h | Freq: Once | INTRAVENOUS | Status: AC
  Administered 2016-12-30: 10 mL/h via INTRAVENOUS

## 2016-12-30 MED ORDER — PANTOPRAZOLE SODIUM 40 MG IV SOLR
40.0000 mg | Freq: Two times a day (BID) | INTRAVENOUS | Status: DC
  Administered 2016-12-31 (×3): 40 mg via INTRAVENOUS
  Filled 2016-12-30 (×3): qty 40

## 2016-12-30 MED ORDER — ONDANSETRON HCL 4 MG PO TABS: 4.0000 mg | ORAL_TABLET | Freq: Four times a day (QID) | ORAL | Status: DC | PRN

## 2016-12-30 MED ORDER — BUDESONIDE 0.5 MG/2ML IN SUSP
0.5000 mg | Freq: Two times a day (BID) | RESPIRATORY_TRACT | Status: DC
  Administered 2016-12-30 – 2017-01-01 (×4): 0.5 mg via RESPIRATORY_TRACT
  Filled 2016-12-30 (×4): qty 2

## 2016-12-30 MED ORDER — PRAMIPEXOLE DIHYDROCHLORIDE 0.125 MG PO TABS
0.1250 mg | ORAL_TABLET | Freq: Every day | ORAL | Status: DC | PRN
  Filled 2016-12-30: qty 1

## 2016-12-30 MED ORDER — FUROSEMIDE 40 MG PO TABS
40.0000 mg | ORAL_TABLET | Freq: Every day | ORAL | Status: DC
  Administered 2016-12-31 (×2): 40 mg via ORAL
  Filled 2016-12-30 (×2): qty 1

## 2016-12-30 MED ORDER — ATORVASTATIN CALCIUM 40 MG PO TABS
40.0000 mg | ORAL_TABLET | Freq: Every day | ORAL | Status: DC
  Administered 2016-12-31 (×2): 40 mg via ORAL
  Filled 2016-12-30 (×2): qty 1

## 2016-12-30 NOTE — ED Notes (Signed)
Attempted report x1. 

## 2016-12-30 NOTE — Patient Instructions (Signed)
Medication Instructions:   NO CHANGE  Labwork:  Your physician recommends that you HAVE LAB WORK TODAY  Testing/Procedures:  Your physician has requested that you have an echocardiogram. Echocardiography is a painless test that uses sound waves to create images of your heart. It provides your doctor with information about the size and shape of your heart and how well your heart's chambers and valves are working. This procedure takes approximately one hour. There are no restrictions for this procedure.    Follow-Up:  Your physician wants you to follow-up in: [place] will receive a reminder letter in the mail two months in advance. If you don't receive a letter, please call our office to schedule the follow-up appointment.   If you need a refill on your cardiac medications before your next appointment, please call your pharmacy.

## 2016-12-30 NOTE — ED Notes (Signed)
Will transport patient when transfusing RBCs have been hanging for at least 15 minutes.

## 2016-12-30 NOTE — ED Provider Notes (Signed)
[facility] DEPT Provider Note   CSN: [id number] Arrival date & time: 12/30/16  1646     History   Chief Complaint Chief Complaint  Patient presents with  . Abnormal Lab  . Shortness of Breath    HPI [name] [name] is a 71 y.o. male.  Patient is a 71 year old male with past medical history of heart valve replacement, COPD. He presents for evaluation of dyspnea on exertion and low hemoglobin. He has been experiencing diarrhea that has been grossly bloody for the past month. For the past several days he has felt very weak and is now having dizziness and shortness of breath when he ambulates. He denies any abdominal pain, fevers, or chills. He reports having had hemorrhoids in the past, but has never had a colonoscopy.   The history is provided by the patient.  Abnormal Lab  Time since result:  HB 6.5 Patient referred by:  PCP   Past Medical History:  Diagnosis Date  . Aortic stenosis    s/p bioprosthetic valve replacement  . Cardiomyopathy 2006   EF 30%; has history of CHF  . COPD (chronic obstructive pulmonary disease) (HCC)    FEV1/FVC -> 0.52 (69% of predicted)  . Osteoarthritis   . Tobacco abuse     Patient Active Problem List   Diagnosis Date Noted  . Thoracic aortic aneurysm ([place]) 03/01/2011  . HTN (hypertension) 03/01/2011  . TOBACCO ABUSE 07/12/2010  . Dyslipidemia 06/21/2010  . CARDIOMYOPATHY 06/21/2010  . BACK PAIN, THORACIC REGION 06/21/2010  . URINALYSIS, ABNORMAL 06/21/2010  . RESTLESS LEG SYNDROME 06/25/2007  . AORTIC STENOSIS, SEVERE 10/10/2006  . COPD 10/10/2006  . OSTEOARTHRITIS 10/10/2006  . AORTIC VALVE REPLACEMENT, HX OF 10/10/2006    Past Surgical History:  Procedure Laterality Date  . AORTIC VALVE REPLACEMENT     bioprosthetic, 01/2006 - cannot find record of surgery (has scar)  . TONSILLECTOMY         Home Medications    Prior to Admission medications   Medication Sig Start Date End Date Taking? Authorizing Provider   aspirin 325 MG tablet Take 325 mg by mouth. Every other day    Historical Provider, MD  atorvastatin (LIPITOR) 40 MG tablet Take 1 tablet (40 mg total) by mouth daily. 01/26/16   Fay Records, MD  budesonide-formoterol [facility]) 160-4.5 MCG/ACT inhaler Inhale 2 puffs into the lungs 2 (two) times daily. 11/11/11 01/22/16  Tanda Rockers, MD  COMBIVENT RESPIMAT 20-100 MCG/ACT AERS respimat Take 2 puffs by mouth as directed. 12/14/16   Historical Provider, MD  dextromethorphan-guaiFENesin (MUCINEX DM) 30-600 MG per 12 hr tablet Take 1 tablet by mouth every 12 (twelve) hours as needed.    Historical Provider, MD  furosemide (LASIX) 40 MG tablet Take 1 tablet by mouth daily. 11/04/16   Historical Provider, MD  methocarbamol (ROBAXIN) 500 MG tablet Take 500 mg by mouth 3 (three) times daily. Take 1 tab by mouth no more than 3 times a day as needed for pain or spasms in back     Historical Provider, MD  multivitamin [facility]) per tablet Take 1 tablet by mouth daily.      Historical Provider, MD  naproxen (NAPROSYN) 500 MG tablet Take 500 mg by mouth 2 (two) times daily with a meal.      Historical Provider, MD  potassium chloride (KLOR-CON) 10 MEQ CR tablet Take 10 mEq by mouth daily.      Historical Provider, MD  pramipexole (MIRAPEX) 0.125 MG tablet Take 1  tablet by mouth daily as needed. 12/28/16   Historical Provider, MD  VENTOLIN HFA 108 (90 Base) MCG/ACT inhaler Take 2 puffs by mouth as needed for shortness of breath. 12/19/16   Historical Provider, MD  VITAMIN E PO Take 1 capsule by mouth daily.     Historical Provider, MD    Family History Family History  Problem Relation Age of Onset  . Cancer Mother     uterine cancer  . Heart failure Father   . Coronary artery disease Father   . Cancer Brother     neck/throat cancer    Social History Social History  Substance Use Topics  . Smoking status: Former Smoker    Packs/day: 0.25    Years: 50.00    Types: Cigarettes  . Smokeless tobacco:  Never Used  . Alcohol use No     Comment: states he quit many years ago     Allergies   Patient has no known allergies.   Review of Systems Review of Systems  All other systems reviewed and are negative.    Physical Exam Updated Vital Signs BP (!) 153/107 (BP Location: Left Arm)   Pulse (!) 109   Temp 97.7 F (36.5 C) (Oral)   Resp (!) 22   Ht 5\' 6"  (1.676 m)   Wt 167 lb (75.8 kg)   SpO2 100%   BMI 26.95 kg/m   Physical Exam  Constitutional: He is oriented to person, place, and time. He appears well-developed and well-nourished. No distress.  HENT:  Head: Normocephalic and atraumatic.  Mouth/Throat: Oropharynx is clear and moist.  Neck: Normal range of motion. Neck supple.  Cardiovascular: Normal rate and regular rhythm.  Exam reveals no friction rub.   No murmur heard. Pulmonary/Chest: Effort normal and breath sounds normal. No respiratory distress. He has no wheezes. He has no rales.  Abdominal: Soft. Bowel sounds are normal. He exhibits no distension. There is no tenderness.  Genitourinary: Rectal exam shows guaiac positive stool.  Musculoskeletal: Normal range of motion. He exhibits no edema.  Neurological: He is alert and oriented to person, place, and time. Coordination normal.  Skin: Skin is warm and dry. He is not diaphoretic.  Nursing note and vitals reviewed.    ED Treatments / Results  Labs (all labs ordered are listed, but only abnormal results are displayed) Labs Reviewed  COMPREHENSIVE METABOLIC PANEL - Abnormal; Notable for the following:       Result Value   Glucose, Bld 114 (*)    Total Protein 6.4 (*)    Alkaline Phosphatase 37 (*)    All other components within normal limits  CBC - Abnormal; Notable for the following:    RBC 2.53 (*)    Hemoglobin 6.5 (*)    HCT 20.5 (*)    MCH 25.7 (*)    All other components within normal limits  POC OCCULT BLOOD, ED  TYPE AND SCREEN  ABO/RH    EKG  EKG Interpretation  Date/Time:  Friday  December 30 2016 16:53:46 EDT Ventricular Rate:  103 PR Interval:  216 QRS Duration: 134 QT Interval:  352 QTC Calculation: 461 R Axis:   97 Text Interpretation:  Sinus tachycardia with 1st degree A-V block Non-specific intra-ventricular conduction block Anterolateral infarct , age undetermined T wave abnormality, consider inferior ischemia Abnormal ECG t wave inversion more promintent in inferolateral leads and rate has increased since last tracing April 12, 2011 Confirmed by RAY MD, Andee Poles [phone]) on 12/30/2016 4:59:04 PM  Radiology No results found.  Procedures Procedures (including critical care time)  Medications Ordered in ED Medications - No data to display   Initial Impression / Assessment and Plan / ED Course  I have reviewed the triage vital signs and the nursing notes.  Pertinent labs & imaging results that were available during my care of the patient were reviewed by me and considered in my medical decision making (see chart for details).  Patient sent from the primary doctor's office for evaluation of symptomatic anemia. He reports bloody diarrhea for the past month. His workup reveals a hemoglobin of 6.5 and his stools are heme positive. I discussed this with Dr. Oletta Lamas from gastroenterology who is requesting admission to the hospitalist service. Dr. Tamala Julian agrees to admit.  Transfusion of 2 units of packed red cells has been ordered.  CRITICAL CARE Performed by: Veryl Speak Total critical care time: 30 minutes Critical care time was exclusive of separately billable procedures and treating other patients. Critical care was necessary to treat or prevent imminent or life-threatening deterioration. Critical care was time spent personally by me on the following activities: development of treatment plan with patient and/or surrogate as well as nursing, discussions with consultants, evaluation of patient's response to treatment, examination of patient, obtaining  history from patient or surrogate, ordering and performing treatments and interventions, ordering and review of laboratory studies, ordering and review of radiographic studies, pulse oximetry and re-evaluation of patient's condition.   Final Clinical Impressions(s) / ED Diagnoses   Final diagnoses:  None    New Prescriptions New Prescriptions   No medications on file     Veryl Speak, MD 12/30/16 2000

## 2016-12-30 NOTE — ED Notes (Signed)
Blood consent form signed, witnessed by this RN. At the bedside.

## 2016-12-30 NOTE — H&P (Addendum)
History and Physical    [name] [name] ATF:[id number] DOB: November 11, 1945 DOA: 12/30/2016  Referring MD/NP/PA: Dr. Stark Jock PCP: Philis Fendt, MD  Patient coming from: Home  Chief Complaint: Abnormal lab  HPI: [name] [name] is a 71 y.o. male with medical history significant of COPD, AS s/p bioprosthetic valve replacement, cardiomyopathy last EF 30%, HLD, RLS ;who presents with complaints of abnormal lab work. Patient had gone to his PCP office today for generalized weakness and worsening shortness of breath. On a daily basis patient reports utilizing inhalers more than prescribed. For the last month patient notes that he's been dealing with diarrhea and what he believes to be hemorrhoids. Reporting liquid stools with squirting blood after each bowel movement. He has tried nothing for the symptoms. Associated symptoms include lightheadedness and near syncope. Lab work was obtained at the PCPs office and patient was notified that his hemoglobin was 6.5 and that he should come to the ED for further evaluation. He does have a history of intermittently use [employer] Goody powders and naproxen. Patient is followed by Dr. Dorris Carnes of cardiology and was scheduled to have a echocardiogram on the 17th of this month.  ED course: Upon to the emergency room patient was noted to be afebrile, heart rates up to 109, respirations of 22, O2 saturations maintained on room air, and blood pressures stable at 144/82. Lab work revealed a hemoglobin of 6.5. Guaiac stool was noted to be bright red and positive for blood. Patient was type and screen ordered 2 units of packed red blood cells. Dr. Oletta Lamas GI was consulted and will see the patient in a.m.   Review of Systems: As per HPI otherwise 10 point review of systems negative.   Past Medical History:  Diagnosis Date  . Aortic stenosis    s/p bioprosthetic valve replacement  . Cardiomyopathy 2006   EF 30%; has history of CHF  . COPD (chronic obstructive pulmonary disease)  (HCC)    FEV1/FVC -> 0.52 (69% of predicted)  . Osteoarthritis   . Tobacco abuse     Past Surgical History:  Procedure Laterality Date  . AORTIC VALVE REPLACEMENT     bioprosthetic, 01/2006 - cannot find record of surgery (has scar)  . TONSILLECTOMY       reports that he has quit smoking. His smoking use included Cigarettes. He has a 12.50 pack-year smoking history. He has never used smokeless tobacco. He reports that he does not drink alcohol or use drugs.  No Known Allergies  Family History  Problem Relation Age of Onset  . Cancer Mother     uterine cancer  . Heart failure Father   . Coronary artery disease Father   . Cancer Brother     neck/throat cancer    Prior to Admission medications   Medication Sig Start Date End Date Taking? Authorizing Provider  albuterol (PROVENTIL HFA;VENTOLIN HFA) 108 (90 Base) MCG/ACT inhaler Inhale 2 puffs into the lungs every 6 (six) hours as needed for wheezing or shortness of breath.   Yes Historical Provider, MD  albuterol (PROVENTIL) (2.5 MG/3ML) 0.083% nebulizer solution Take 2.5 mg by nebulization every 6 (six) hours as needed for wheezing or shortness of breath.   Yes Historical Provider, MD  Aspirin-Acetaminophen-Caffeine (GOODY HEADACHE PO) Take 1 packet by mouth daily as needed (headache).   Yes Historical Provider, MD  atorvastatin (LIPITOR) 40 MG tablet Take 1 tablet (40 mg total) by mouth daily. Patient taking differently: Take 40 mg by mouth at bedtime.  01/26/16  Yes Fay Records, MD  budesonide-formoterol [facility]) 160-4.5 MCG/ACT inhaler Inhale 2 puffs into the lungs 2 (two) times daily.   Yes Historical Provider, MD  Cholecalciferol (VITAMIN D PO) Take 1 tablet by mouth daily.   Yes Historical Provider, MD  Cyanocobalamin (VITAMIN B-12 PO) Take 1 tablet by mouth daily.   Yes Historical Provider, MD  dextromethorphan-guaiFENesin (MUCINEX DM) 30-600 MG per 12 hr tablet Take 1 tablet by mouth every 12 (twelve) hours as needed for  cough (mucous build up).    Yes Historical Provider, MD  furosemide (LASIX) 40 MG tablet Take 1 tablet by mouth at bedtime.  11/04/16  Yes Historical Provider, MD  Ipratropium-Albuterol (COMBIVENT RESPIMAT) 20-100 MCG/ACT AERS respimat Inhale 1 puff into the lungs every 6 (six) hours as needed for wheezing or shortness of breath.   Yes Historical Provider, MD  Multiple Vitamin (MULTIVITAMIN WITH MINERALS) TABS tablet Take 1 tablet by mouth daily. Alive   Yes Historical Provider, MD  naproxen sodium (ALEVE) 220 MG tablet Take 220 mg by mouth daily as needed (headache/ body aches).   Yes Historical Provider, MD  potassium chloride (KLOR-CON) 10 MEQ CR tablet Take 10 mEq by mouth at bedtime.    Yes Historical Provider, MD  pramipexole (MIRAPEX) 0.125 MG tablet Take 0.125 mg by mouth daily as needed (restless legs).  12/28/16  Yes Historical Provider, MD    Physical Exam:   Constitutional: Elderly male who appears to be in some moderate respiratory distress Vitals:   12/30/16 1654 12/30/16 1711 12/30/16 1845  BP: (!) 153/107  (!) 144/82  Pulse: (!) 109  83  Resp: (!) 22  14  Temp: 97.7 F (36.5 C)    TempSrc: Oral    SpO2: 100%  100%  Weight:  75.8 kg (167 lb)   Height:  5\' 6"  (1.676 m)    Eyes: PERRL, lids and conjunctivae normal ENMT: Mucous membranes are moist. Posterior pharynx clear of any exudate or lesions.Normal dentition.  Neck: normal, supple, no masses, no thyromegaly Respiratory: Mildly tachypneic with decreased overall aeration. Cannot appreciate any significant wheezing, but patient doffing and shortened sentences. There is some accessory muscle use. Cardiovascular: Regular rate and rhythm, no murmurs / rubs / gallops. No extremity edema. 2+ pedal pulses. No carotid bruits.  Abdomen: no tenderness, no masses palpated. No hepatosplenomegaly. Bowel sounds positive.  Musculoskeletal: no clubbing / cyanosis. No joint deformity upper and lower extremities. Good ROM, no contractures.  Normal muscle tone.  Skin: Pallor. No induration Neurologic: CN 2-12 grossly intact. Sensation intact, DTR normal. Strength 5/5 in all 4.  Psychiatric: Normal judgment and insight. Alert and oriented x 3. Normal mood.     Labs on Admission: I have personally reviewed following labs and imaging studies  CBC:  Recent Labs Lab 12/30/16 1054 12/30/16 1720  WBC 7.9 6.5  HGB  --  6.5*  HCT 20.7* 20.5*  MCV 81 81.0  PLT 295 191   Basic Metabolic Panel:  Recent Labs Lab 12/30/16 1054 12/30/16 1720  NA 139 138  K 4.5 4.5  CL 102 106  CO2 20 24  GLUCOSE 101* 114*  BUN 16 14  CREATININE 0.98 1.17  CALCIUM 8.5* 9.1   GFR: Estimated Creatinine Clearance: 53.8 mL/min (by C-G formula based on SCr of 1.17 mg/dL). Liver Function Tests:  Recent Labs Lab 12/30/16 1720  AST 21  ALT 18  ALKPHOS 37*  BILITOT 0.7  PROT 6.4*  ALBUMIN 3.8   No results  for input(s): LIPASE, AMYLASE in the last 168 hours. No results for input(s): AMMONIA in the last 168 hours. Coagulation Profile: No results for input(s): INR, PROTIME in the last 168 hours. Cardiac Enzymes: No results for input(s): CKTOTAL, CKMB, CKMBINDEX, TROPONINI in the last 168 hours. BNP (last 3 results) No results for input(s): PROBNP in the last 8760 hours. HbA1C: No results for input(s): HGBA1C in the last 72 hours. CBG: No results for input(s): GLUCAP in the last 168 hours. Lipid Profile:  Recent Labs  12/30/16 1054  CHOL 173  HDL 66  LDLCALC 93  TRIG 72  CHOLHDL 2.6   Thyroid Function Tests:  Recent Labs  12/30/16 1054  TSH 1.600   Anemia Panel: No results for input(s): VITAMINB12, FOLATE, FERRITIN, TIBC, IRON, RETICCTPCT in the last 72 hours. Urine analysis:    Component Value Date/Time   LABSPEC 1.020 06/21/2010 1108   PHURINE 5.5 06/21/2010 1108   HGBUR negative 06/21/2010 1108   BILIRUBINUR negative 06/21/2010 1108   UROBILINOGEN 0.2 06/21/2010 1108   NITRITE negative 06/21/2010 1108    Sepsis Labs: No results found for this or any previous visit (from the past 240 hour(s)).   Radiological Exams on Admission: No results found.  EKG: Independently reviewed. Sinus tachycardia with first-degree AV block  Assessment/Plan Suspected Lower GI bleed with Symptomatic anemia: Patient presents with a month-long history of progressive blood per rectum suspected secondary to hemorrhoids. Initial hemoglobin noted to be 6.5 previously noted to be within normal limits on review of records. Suspect likely lower GI bleed though patient has risk factors for upper GI bleed including NSAID use - Admit to telemetry. - ok for Ice chips - Continue transfusion of 2 units of packed red blood cells - Protonix 40 mg IV - order placed for RN to place order for H&H 1 hr posttransfusion - Serial hemoglobin monitoring - Transfuse as necessary for hemoglobin less than 8 - Appreciate Dr. Oletta Lamas of GI consultative services, will follow-up for further recommendations  COPD, with acute exacerbation: On physical examination not moving a lot of air, no significant wheezing appreciated. Patient notes very frequent use of inhalers on a daily basis more than is prescribed. - check CXR  - Give 125 mg of Solu-Medrol IV 1 dose - Duonebs q4 hr prn SOB/Wheezing - Budesonide and Brovana nebs to substitute for patients Symbicort inhaler  Cardiomyopathy/history of bioprosthetic aortic valve replacement: Patient's last EF was noted to be 45-50% on last echo performed and 01/2015.  - Strict ins and outs - Continuous pulse oximetry overnight - Continue home Lasix dose and will give additional IV Lasix, if needed with blood products being transfused. - Question need of inpatient echo as patient has one scheduled with Dr. Harrington Challenger cardiology for 4/17  Essential hypertension - As seen above  Hyperlipidemia - Continue atorvastatin  RLS - continue mirapex  H/O tobacco abuse: Patient quit smoking years ago.  DVT  prophylaxis: SCDs Code Status: Full Family Communication: Discussed plan of care with the patient and family members present at bedside  Disposition Plan: Likely discharge home once medically stable Consults called: GI  Admission status: Inpatient Norval Morton MD [facility] Pager [phone]  If 7PM-7AM, please contact night-coverage [email or web address] Password [facility]  12/30/2016, 7:31 PM

## 2016-12-30 NOTE — ED Triage Notes (Signed)
Pt sent by PCP for blood transfusion, per pt his hgb was 6 today, pt reports SOB & generalized weakness, pt reports recent rectal bleeding & resolving diarrhea, pt reports hx of bleeding hemorrhoids, pt pale, A&O x4

## 2016-12-30 NOTE — ED Notes (Signed)
ED Provider at bedside. 

## 2016-12-30 NOTE — ED Notes (Addendum)
Edit: volume of RBCs to be transfused 376mL, unable to edit previously documented "567mL" located under Blood Administration.

## 2016-12-31 ENCOUNTER — Encounter (HOSPITAL_COMMUNITY): Payer: Self-pay

## 2016-12-31 DIAGNOSIS — R0602 Shortness of breath: Secondary | ICD-10-CM

## 2016-12-31 DIAGNOSIS — D62 Acute posthemorrhagic anemia: Secondary | ICD-10-CM | POA: Insufficient documentation

## 2016-12-31 DIAGNOSIS — D649 Anemia, unspecified: Secondary | ICD-10-CM | POA: Diagnosis present

## 2016-12-31 LAB — CBC WITH DIFFERENTIAL/PLATELET
Basophils Absolute: 0 10*3/uL (ref 0.0–0.1)
Basophils Relative: 0 %
Eosinophils Absolute: 0 10*3/uL (ref 0.0–0.7)
Eosinophils Relative: 0 %
HCT: 32.5 % — ABNORMAL LOW (ref 39.0–52.0)
Hemoglobin: 10.4 g/dL — ABNORMAL LOW (ref 13.0–17.0)
Lymphocytes Relative: 6 %
Lymphs Abs: 0.5 10*3/uL — ABNORMAL LOW (ref 0.7–4.0)
MCH: 26.5 pg (ref 26.0–34.0)
MCHC: 32 g/dL (ref 30.0–36.0)
MCV: 82.9 fL (ref 78.0–100.0)
Monocytes Absolute: 0.1 10*3/uL (ref 0.1–1.0)
Monocytes Relative: 1 %
Neutro Abs: 7.4 10*3/uL (ref 1.7–7.7)
Neutrophils Relative %: 93 %
Platelets: 270 10*3/uL (ref 150–400)
RBC: 3.92 MIL/uL — ABNORMAL LOW (ref 4.22–5.81)
RDW: 14.5 % (ref 11.5–15.5)
WBC: 8 10*3/uL (ref 4.0–10.5)

## 2016-12-31 LAB — IRON AND TIBC
Iron: 27 ug/dL — ABNORMAL LOW (ref 45–182)
Saturation Ratios: 5 % — ABNORMAL LOW (ref 17.9–39.5)
TIBC: 564 ug/dL — ABNORMAL HIGH (ref 250–450)
UIBC: 537 ug/dL

## 2016-12-31 LAB — RETICULOCYTES
RBC.: 3.64 MIL/uL — ABNORMAL LOW (ref 4.22–5.81)
Retic Count, Absolute: 87.4 10*3/uL (ref 19.0–186.0)
Retic Ct Pct: 2.4 % (ref 0.4–3.1)

## 2016-12-31 LAB — VITAMIN B12: Vitamin B-12: 576 pg/mL (ref 180–914)

## 2016-12-31 LAB — FOLATE: Folate: 21.9 ng/mL (ref >5.9)

## 2016-12-31 LAB — FERRITIN: Ferritin: 5 ng/mL — ABNORMAL LOW (ref 24–336)

## 2016-12-31 MED ORDER — PREDNISONE 50 MG PO TABS: 50.0000 mg | ORAL_TABLET | Freq: Every day | ORAL | Status: DC

## 2016-12-31 MED ORDER — FUROSEMIDE 10 MG/ML IJ SOLN
40.0000 mg | Freq: Once | INTRAMUSCULAR | Status: AC
Start: 2016-12-31 — End: 2016-12-31
  Administered 2016-12-31: 40 mg via INTRAVENOUS
  Filled 2016-12-31: qty 4

## 2016-12-31 MED ORDER — ALBUTEROL SULFATE (2.5 MG/3ML) 0.083% IN NEBU: 2.5000 mg | INHALATION_SOLUTION | RESPIRATORY_TRACT | Status: DC | PRN

## 2016-12-31 MED ORDER — METHYLPREDNISOLONE SODIUM SUCC 125 MG IJ SOLR
125.0000 mg | INTRAMUSCULAR | Status: AC
  Administered 2016-12-31: 125 mg via INTRAVENOUS
  Filled 2016-12-31: qty 2

## 2016-12-31 MED ORDER — PEG 3350-KCL-NA BICARB-NACL 420 G PO SOLR
4000.0000 mL | Freq: Once | ORAL | Status: AC
  Administered 2016-12-31: 4000 mL via ORAL
  Filled 2016-12-31: qty 4000

## 2016-12-31 MED ORDER — BISACODYL 5 MG PO TBEC: 10.0000 mg | DELAYED_RELEASE_TABLET | Freq: Every day | ORAL | Status: DC | PRN

## 2016-12-31 MED ORDER — POTASSIUM CHLORIDE CRYS ER 20 MEQ PO TBCR
40.0000 meq | EXTENDED_RELEASE_TABLET | Freq: Once | ORAL | Status: AC
  Administered 2016-12-31: 40 meq via ORAL
  Filled 2016-12-31: qty 2

## 2016-12-31 NOTE — Consult Note (Signed)
[facility] CONS[facility] Reason for consult: G.I. bleeding Referring Physician: [place]. PCP: Dr. Jeanie Cooks. Primary G.I.: patient is unassigned  [name] [name] is an 71 y.o. male.  HPI: he has a history of severe COPD and was on home oxygen until recently. He states that his pulmonary doctor took them off of the oxygen after he quit smoking about a year ago that he still has significant shortness of breath. He also has cardiomyopathy and has had AVR with bioprosthetic valve last ejection fraction was 30%. He is not on any anticoagulation. He went to his PCP yesterday was significantly short of breath and lab work revealed hemoglobin of 6.5. The patient readily admits that he takes [facility] Goody powders every day and has for number of years. He takes them at least 2 to 3 times a day. He denies taking anything for heartburn and indigestion. He states that he has had a problem with hemorrhoids bleeding in the past. He had the flu recently and develop some diarrhea. Whenever he has diarrhea he has bleeding and he's continued to have bleeding ever since having the flu several weeks ago. He describes a lot of bright red blood dripping into the toilet and around and mixed with his bowel movements. He has never had a colonoscopy. He does not take anything routinely for reflux. He has been transfused hemoglobin of over 10.  Past Medical History:  Diagnosis Date  . Aortic stenosis    s/p bioprosthetic valve replacement  . Cardiomyopathy 2006   EF 30%; has history of CHF  . CHF (congestive heart failure) ([place])   . COPD (chronic obstructive pulmonary disease) (HCC)    FEV1/FVC -> 0.52 (69% of predicted)  . Osteoarthritis   . Tobacco abuse     Past Surgical History:  Procedure Laterality Date  . AORTIC VALVE REPLACEMENT     bioprosthetic, 01/2006 - cannot find record of surgery (has scar)  . TONSILLECTOMY      Family History  Problem Relation Age of Onset  . Cancer Mother     uterine cancer   . Heart failure Father   . Coronary artery disease Father   . Cancer Brother     neck/throat cancer    Social History:  reports that he has quit smoking. His smoking use included Cigarettes. He has a 12.50 pack-year smoking history. He has never used smokeless tobacco. He reports that he does not drink alcohol or use drugs.  Allergies: No Known Allergies  Medications; Prior to Admission medications   Medication Sig Start Date End Date Taking? Authorizing Provider  albuterol (PROVENTIL HFA;VENTOLIN HFA) 108 (90 Base) MCG/ACT inhaler Inhale 2 puffs into the lungs every 6 (six) hours as needed for wheezing or shortness of breath.   Yes Historical Provider, MD  albuterol (PROVENTIL) (2.5 MG/3ML) 0.083% nebulizer solution Take 2.5 mg by nebulization every 6 (six) hours as needed for wheezing or shortness of breath.   Yes Historical Provider, MD  Aspirin-Acetaminophen-Caffeine (GOODY HEADACHE PO) Take 1 packet by mouth daily as needed (headache).   Yes Historical Provider, MD  atorvastatin (LIPITOR) 40 MG tablet Take 1 tablet (40 mg total) by mouth daily. Patient taking differently: Take 40 mg by mouth at bedtime.  01/26/16  Yes Fay Records, MD  budesonide-formoterol [facility]) 160-4.5 MCG/ACT inhaler Inhale 2 puffs into the lungs 2 (two) times daily.   Yes Historical Provider, MD  Cholecalciferol (VITAMIN D PO) Take 1 tablet by mouth daily.   Yes Historical Provider, MD  Cyanocobalamin (VITAMIN B-12 PO) Take 1 tablet by mouth daily.   Yes Historical Provider, MD  dextromethorphan-guaiFENesin (MUCINEX DM) 30-600 MG per 12 hr tablet Take 1 tablet by mouth every 12 (twelve) hours as needed for cough (mucous build up).    Yes Historical Provider, MD  furosemide (LASIX) 40 MG tablet Take 1 tablet by mouth at bedtime.  11/04/16  Yes Historical Provider, MD  Ipratropium-Albuterol (COMBIVENT RESPIMAT) 20-100 MCG/ACT AERS respimat Inhale 1 puff into the lungs every 6 (six) hours as needed for wheezing or  shortness of breath.   Yes Historical Provider, MD  Multiple Vitamin (MULTIVITAMIN WITH MINERALS) TABS tablet Take 1 tablet by mouth daily. Alive   Yes Historical Provider, MD  naproxen sodium (ALEVE) 220 MG tablet Take 220 mg by mouth daily as needed (headache/ body aches).   Yes Historical Provider, MD  potassium chloride (KLOR-CON) 10 MEQ CR tablet Take 10 mEq by mouth at bedtime.    Yes Historical Provider, MD  pramipexole (MIRAPEX) 0.125 MG tablet Take 0.125 mg by mouth daily as needed (restless legs).  12/28/16  Yes Historical Provider, MD   . arformoterol  15 mcg Nebulization BID  . atorvastatin  40 mg Oral QHS  . budesonide (PULMICORT) nebulizer solution  0.5 mg Nebulization BID  . furosemide  40 mg Intravenous Once  . furosemide  40 mg Oral QHS  . ipratropium-albuterol  3 mL Nebulization STAT  . pantoprazole (PROTONIX) IV  40 mg Intravenous Q12H  . polyethylene glycol-electrolytes  4,000 mL Oral Once  . potassium chloride  40 mEq Oral Once   PRN Meds acetaminophen **OR** acetaminophen, albuterol, bisacodyl, ipratropium-albuterol, ondansetron **OR** ondansetron (ZOFRAN) IV, pramipexole Results for orders placed or performed during the hospital encounter of 12/30/16 (from the past 48 hour(s))  Type and screen [place]     Status: None (Preliminary result)   Collection Time: 12/30/16  5:11 PM  Result Value Ref Range   ABO/RH(D) A POS    Antibody Screen NEG    Sample Expiration 01/02/2017    Unit Number [id number]    Blood Component Type RED CELLS,LR    Unit division 00    Status of Unit ISSUED,FINAL    Transfusion Status OK TO TRANSFUSE    Crossmatch Result Compatible    Unit Number [id number]    Blood Component Type RED CELLS,LR    Unit division 00    Status of Unit ISSUED    Transfusion Status OK TO TRANSFUSE    Crossmatch Result Compatible   ABO/Rh     Status: None   Collection Time: 12/30/16  5:19 PM  Result Value Ref Range   ABO/RH(D) A POS    Comprehensive metabolic panel     Status: Abnormal   Collection Time: 12/30/16  5:20 PM  Result Value Ref Range   Sodium 138 135 - 145 mmol/L   Potassium 4.5 3.5 - 5.1 mmol/L   Chloride 106 101 - 111 mmol/L   CO2 24 22 - 32 mmol/L   Glucose, Bld 114 (H) 65 - 99 mg/dL   BUN 14 6 - 20 mg/dL   Creatinine, Ser 1.17 0.61 - 1.24 mg/dL   Calcium 9.1 8.9 - 10.3 mg/dL   Total Protein 6.4 (L) 6.5 - 8.1 g/dL   Albumin 3.8 3.5 - 5.0 g/dL   AST 21 15 - 41 U/L   ALT 18 17 - 63 U/L   Alkaline Phosphatase 37 (L) 38 - 126 U/L   Total Bilirubin  0.7 0.3 - 1.2 mg/dL   GFR calc non Af Amer >60 >60 mL/min   GFR calc Af Amer >60 >60 mL/min    Comment: (NOTE) The eGFR has been calculated using the CKD EPI equation. This calculation has not been validated in all clinical situations. eGFR's persistently <60 mL/min signify possible Chronic Kidney Disease.    Anion gap 8 5 - 15  CBC     Status: Abnormal   Collection Time: 12/30/16  5:20 PM  Result Value Ref Range   WBC 6.5 4.0 - 10.5 K/uL   RBC 2.53 (L) 4.22 - 5.81 MIL/uL   Hemoglobin 6.5 (LL) 13.0 - 17.0 g/dL    Comment: REPEATED TO VERIFY CRITICAL RESULT CALLED TO, READ BACK BY AND VERIFIED WITH: [name] Midget RN [phone] GREEN R    HCT 20.5 (L) 39.0 - 52.0 %   MCV 81.0 78.0 - 100.0 fL   MCH 25.7 (L) 26.0 - 34.0 pg   MCHC 31.7 30.0 - 36.0 g/dL   RDW 15.2 11.5 - 15.5 %   Platelets 261 150 - 400 K/uL  POC occult blood, ED     Status: Abnormal   Collection Time: 12/30/16  6:14 PM  Result Value Ref Range   Fecal Occult Bld POSITIVE (A) NEGATIVE  Prepare RBC     Status: None   Collection Time: 12/30/16  7:15 PM  Result Value Ref Range   Order Confirmation ORDER PROCESSED BY BLOOD BANK   CBC with Differential/Platelet     Status: Abnormal   Collection Time: 12/31/16  7:54 AM  Result Value Ref Range   WBC 8.0 4.0 - 10.5 K/uL   RBC 3.92 (L) 4.22 - 5.81 MIL/uL   Hemoglobin 10.4 (L) 13.0 - 17.0 g/dL    Comment: POST TRANSFUSION SPECIMEN REPEATED  TO VERIFY    HCT 32.5 (L) 39.0 - 52.0 %   MCV 82.9 78.0 - 100.0 fL   MCH 26.5 26.0 - 34.0 pg   MCHC 32.0 30.0 - 36.0 g/dL   RDW 14.5 11.5 - 15.5 %   Platelets 270 150 - 400 K/uL   Neutrophils Relative % 93 %   Neutro Abs 7.4 1.7 - 7.7 K/uL   Lymphocytes Relative 6 %   Lymphs Abs 0.5 (L) 0.7 - 4.0 K/uL   Monocytes Relative 1 %   Monocytes Absolute 0.1 0.1 - 1.0 K/uL   Eosinophils Relative 0 %   Eosinophils Absolute 0.0 0.0 - 0.7 K/uL   Basophils Relative 0 %   Basophils Absolute 0.0 0.0 - 0.1 K/uL    Dg Chest Port 1 View  Result Date: 12/30/2016 CLINICAL DATA:  Acute onset of shortness of breath and generalized weakness. Decreased hemoglobin. Initial encounter. EXAM: PORTABLE CHEST 1 VIEW COMPARISON:  Chest radiograph performed 04/12/2011, and CTA of the chest performed 02/26/2016 FINDINGS: The lungs are well-aerated and clear. There is no evidence of focal opacification, pleural effusion or pneumothorax. The cardiomediastinal silhouette is borderline enlarged. The patient is status post median sternotomy, with evidence of prior CABG. An aortic valve replacement is noted. No acute osseous abnormalities are seen. IMPRESSION: 1. No acute cardiopulmonary process seen. 2. Borderline cardiomegaly. Electronically Signed   By: Garald Balding M.D.   On: 12/30/2016 22:32               Blood pressure (!) 144/80, pulse 92, temperature 97.9 F (36.6 C), temperature source Oral, resp. rate 20, height _0  (1.676 m), weight 71 kg (156 lb 9.6  oz), SpO2 98 %.  Physical exam:   General-- pleasant white male somewhat short of breath on oxygen by nasal cannula ENT-- nonicteric Neck-- supple Heart-- regular rate and rhythm without murmurs are gallops Lungs-- clear anteriorly posteriorly patient is somewhat short of breath Abdomen-- soft and nontender Psych-- alert and oriented, appropriate   Assessment: 1. G.I. bleeding. Probably due to hemorrhoids based on the fact that his BUN is  normal and is all bright red. He does take an exorbitant amount of Goody powders and for that reason I think we are looking stomach as well. Have explained to him and his sister that he needs to stop the Sachse powders. His brother had esophageal cancer but no family history of colon cancer. Think we need to clear as much of the: as we can to be sure this is not a tumor. If it is only hemorrhoids surgeons can offer him hemorrhoidectomy, banding, injection etc. 2. COPD. Quit smoking a little over a year ago seems to have fairly significant compromise 3. History of mechanical bowel prosthesis of aortic valve  Plan: we will plan EGD colonoscopy tomorrow. Hopefully his lungs will tolerate the procedures. If were not able to get completely around the: we can at least be sure he does not have a lower G.I. lesion of the internal hemorrhoids. Have discussed this in detail with the patient his sister.   Davidson Palmieri JR,Neng Albee L 12/31/2016, 11:03 AM   This note was created using voice recognition software and minor errors may Have occurred unintentionally. Pager: [phone] If no answer or after hours call [phone]

## 2016-12-31 NOTE — Progress Notes (Addendum)
PROGRESS NOTE                                                                                                                                                                                                             Patient Demographics:    [name] [name], is a 71 y.o. male, DOB - 05-Oct-1945, KWI:[id number]  Admit date - 12/30/2016   Admitting Physician Norval Morton, MD  Outpatient Primary MD for the patient is Philis Fendt, MD  LOS - 1  Chief Complaint  Patient presents with  . Abnormal Lab  . Shortness of Breath       Brief Narrative   [name] [name] is a 72 y.o. male with medical history significant of COPD, AS s/p bioprosthetic valve replacement, cardiomyopathy last EF 30%, HLD, RLS who Was admitted with abnormal lab work showing significant anemia found at PCP office where he was seen for generalized weakness and worsening shortness of breath.   Subjective:    Ivan Anchors today has, No headache, No chest pain, No abdominal pain - No Nausea, No new weakness tingling or numbness, No Cough, +ve chronic SOB.     Assessment  & Plan :     1.Severe acute anemia due to GI blood loss. History of bright red blood per rectum for a while, has history of hemorrhoids but also takes Goody powder etc. Status post 2 units of packed RBC transfusion on 12/30/2016, will order an anemia panel today if iron deficient can be placed on iron, GI on board anticipating colonoscopy and EGD tomorrow. Most likely lower GI bleed. For now continue PPI.  2. Advanced Chronic COPD. Does appear to have advanced disease with minimal air movement bilaterally but no wheezing, does appear to be mildly short of breath, he adds that this is his baseline, have applied 2 L nasal cannula oxygen apparently previously he was on oxygen, added flutter valve, repeat chest x-ray tomorrow. At this time I doubt he has an acute exacerbation, he has been placed  on high-dose steroids upon admission which I will start tapering. Continue supportive care with nebulizer treatments. With primary pulmonologist post discharge.  3. 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 No rales currently, since he received 2 units of packed RBC yesterday we give him IV Lasix  and continue home dose oral Lasix, repeat echocardiogram which she was due to get soon.  4. Restless leg syndrome. No acute issues continue Mirapex.  5. Dyslipidemia. On statin continue.    Diet : Diet NPO time specified Except for: Ice Chips, Sips with Meds    Family Communication  : None  Code Status :  Full  Disposition Plan  :  TBD  Consults  :  GI- [facility]  Procedures  :    EGD-Colonoscopy tomorrow  DVT Prophylaxis  :    SCDs    Lab Results  Component Value Date   PLT 270 12/31/2016    Inpatient Medications  Scheduled Meds: . arformoterol  15 mcg Nebulization BID  . atorvastatin  40 mg Oral QHS  . budesonide (PULMICORT) nebulizer solution  0.5 mg Nebulization BID  . furosemide  40 mg Oral QHS  . ipratropium-albuterol  3 mL Nebulization STAT  . pantoprazole (PROTONIX) IV  40 mg Intravenous Q12H   Continuous Infusions: PRN Meds:.acetaminophen **OR** acetaminophen, albuterol, ipratropium-albuterol, ondansetron **OR** ondansetron (ZOFRAN) IV, pramipexole  Antibiotics  :    Anti-infectives    None         Objective:   Vitals:   12/31/16 0511 12/31/16 0553 12/31/16 0720 12/31/16 0816  BP: (!) 148/82 (!) 144/80    Pulse: 88 92    Resp: (!) 21 18 20    Temp: 98.3 F (36.8 C) 97.9 F (36.6 C)    TempSrc: Oral Oral    SpO2: 97% 97%  98%  Weight:  71 kg (156 lb 9.6 oz)    Height:  5\' 6"  (1.676 m)      Wt Readings from Last 3 Encounters:  12/31/16 71 kg (156 lb 9.6 oz)  12/30/16 75.8 kg (167 lb)  01/22/16 74.8 kg (164 lb 12.8 oz)     Intake/Output Summary (Last 24 hours) at 12/31/16  1055 Last data filed at 12/31/16 0600  Gross per 24 hour  Intake             1170 ml  Output             1750 ml  Net             -580 ml     Physical Exam  Awake Alert, Oriented X 3, No new F.N deficits, Normal affect [place].AT,PERRAL Supple Neck,No JVD, No cervical lymphadenopathy appriciated.  Symmetrical Chest wall movement, Mod air movement bilaterally, No wheezing RRR,No Gallops,Rubs or new Murmurs, No Parasternal Heave +ve B.Sounds, Abd Soft, No tenderness, No organomegaly appriciated, No rebound - guarding or rigidity. No Cyanosis, Clubbing or edema, No new Rash or bruise       Data Review:    CBC  Recent Labs Lab 12/30/16 1054 12/30/16 1720 12/31/16 0754  WBC 7.9 6.5 8.0  HGB  --  6.5* 10.4*  HCT 20.7* 20.5* 32.5*  PLT 295 261 270  MCV 81 81.0 82.9  MCH 25.3* 25.7* 26.5  MCHC 31.4* 31.7 32.0  RDW 15.4 15.2 14.5  LYMPHSABS  --   --  0.5*  MONOABS  --   --  0.1  EOSABS  --   --  0.0  BASOSABS  --   --  0.0    Chemistries   Recent Labs Lab 12/30/16 1054 12/30/16 1720  NA 139 138  K 4.5 4.5  CL 102 106  CO2 20 24  GLUCOSE 101* 114*  BUN 16 14  CREATININE 0.98 1.17  CALCIUM 8.5* 9.1  AST  --  21  ALT  --  18  ALKPHOS  --  37*  BILITOT  --  0.7   ------------------------------------------------------------------------------------------------------------------  Recent Labs  12/30/16 1054  CHOL 173  HDL 66  LDLCALC 93  TRIG 72  CHOLHDL 2.6    No results found for: HGBA1C ------------------------------------------------------------------------------------------------------------------  Recent Labs  12/30/16 1054  TSH 1.600   ------------------------------------------------------------------------------------------------------------------ No results for input(s): VITAMINB12, FOLATE, FERRITIN, TIBC, IRON, RETICCTPCT in the last 72 hours.  Coagulation profile No results for input(s): INR, PROTIME in the last 168 hours.  No results for  input(s): DDIMER in the last 72 hours.  Cardiac Enzymes No results for input(s): CKMB, TROPONINI, MYOGLOBIN in the last 168 hours.  Invalid input(s): CK ------------------------------------------------------------------------------------------------------------------ No results found for: BNP  Micro Results No results found for this or any previous visit (from the past 240 hour(s)).  Radiology Reports Dg Chest Port 1 View  Result Date: 12/30/2016 CLINICAL DATA:  Acute onset of shortness of breath and generalized weakness. Decreased hemoglobin. Initial encounter. EXAM: PORTABLE CHEST 1 VIEW COMPARISON:  Chest radiograph performed 04/12/2011, and CTA of the chest performed 02/26/2016 FINDINGS: The lungs are well-aerated and clear. There is no evidence of focal opacification, pleural effusion or pneumothorax. The cardiomediastinal silhouette is borderline enlarged. The patient is status post median sternotomy, with evidence of prior CABG. An aortic valve replacement is noted. No acute osseous abnormalities are seen. IMPRESSION: 1. No acute cardiopulmonary process seen. 2. Borderline cardiomegaly. Electronically Signed   By: Garald Balding M.D.   On: 12/30/2016 22:32    Time Spent in minutes  30   Kaleyah Labreck K M.D on 12/31/2016 at 10:55 AM  Between 7am to 7pm - Pager - [phone] ( page via [facility], text pages only, please mention full 10 digit call back number).  After 7pm go to [email or web address] - password [facility]  [facility] -  Office  [phone]

## 2017-01-01 ENCOUNTER — Inpatient Hospital Stay (HOSPITAL_COMMUNITY): Payer: Medicare Other

## 2017-01-01 ENCOUNTER — Encounter (HOSPITAL_COMMUNITY)
Admission: EM | Disposition: A | Discharge status: Home / Self Care | Payer: Self-pay | Source: Home / Self Care | Attending: Internal Medicine

## 2017-01-01 ENCOUNTER — Encounter (HOSPITAL_COMMUNITY): Payer: Self-pay | Admitting: Gastroenterology

## 2017-01-01 HISTORY — PX: ESOPHAGOGASTRODUODENOSCOPY: SHX5428

## 2017-01-01 HISTORY — PX: COLONOSCOPY: SHX5424

## 2017-01-01 LAB — TYPE AND SCREEN
ABO/RH(D): A POS
Antibody Screen: NEGATIVE
Unit division: 0
Unit division: 0

## 2017-01-01 LAB — BPAM RBC
Blood Product Expiration Date: 201804172359
Blood Product Expiration Date: 201804172359
ISSUE DATE / TIME: 201803301939
ISSUE DATE / TIME: 201803310108
Unit Type and Rh: 6200
Unit Type and Rh: 6200

## 2017-01-01 LAB — CBC
HCT: 27.6 % — ABNORMAL LOW (ref 39.0–52.0)
Hemoglobin: 9.1 g/dL — ABNORMAL LOW (ref 13.0–17.0)
MCH: 27.2 pg (ref 26.0–34.0)
MCHC: 33 g/dL (ref 30.0–36.0)
MCV: 82.4 fL (ref 78.0–100.0)
Platelets: 236 10*3/uL (ref 150–400)
RBC: 3.35 MIL/uL — ABNORMAL LOW (ref 4.22–5.81)
RDW: 14.9 % (ref 11.5–15.5)
WBC: 8.1 10*3/uL (ref 4.0–10.5)

## 2017-01-01 SURGERY — COLONOSCOPY
Anesthesia: Moderate Sedation

## 2017-01-01 MED ORDER — PREDNISONE 10 MG PO TABS: ORAL_TABLET | ORAL | 0 refills | Status: DC

## 2017-01-01 MED ORDER — MIDAZOLAM HCL 5 MG/ML IJ SOLN
INTRAMUSCULAR | Status: AC
  Filled 2017-01-01: qty 2

## 2017-01-01 MED ORDER — SODIUM CHLORIDE 0.9 % IV SOLN: INTRAVENOUS | Status: DC

## 2017-01-01 MED ORDER — FENTANYL CITRATE (PF) 100 MCG/2ML IJ SOLN
INTRAMUSCULAR | Status: DC | PRN
  Administered 2017-01-01 (×4): 25 ug via INTRAVENOUS

## 2017-01-01 MED ORDER — PANTOPRAZOLE SODIUM 40 MG PO TBEC: 40.0000 mg | DELAYED_RELEASE_TABLET | Freq: Every day | ORAL | 0 refills | Status: DC

## 2017-01-01 MED ORDER — FENTANYL CITRATE (PF) 100 MCG/2ML IJ SOLN
INTRAMUSCULAR | Status: AC
  Filled 2017-01-01: qty 2

## 2017-01-01 MED ORDER — BUTAMBEN-TETRACAINE-BENZOCAINE 2-2-14 % EX AERO
INHALATION_SPRAY | CUTANEOUS | Status: DC | PRN
  Administered 2017-01-01: 2 via TOPICAL

## 2017-01-01 MED ORDER — MIDAZOLAM HCL 5 MG/5ML IJ SOLN
INTRAMUSCULAR | Status: DC | PRN
  Administered 2017-01-01: 2 mg via INTRAVENOUS
  Administered 2017-01-01: 1 mg via INTRAVENOUS
  Administered 2017-01-01 (×3): 2 mg via INTRAVENOUS

## 2017-01-01 NOTE — Op Note (Signed)
[facility] Patient Name: [name] [name] Procedure Date : 01/01/2017 MRN: [id number] Attending MD: Nancy Fetter Dr., MD Date of Birth: Apr 15, 1946 CSN: [id number] Age: 71 Admit Type: Inpatient Procedure:                Upper GI endoscopy Indications:              Iron deficiency anemia secondary to chronic blood                            loss, Heme positive stool Providers:                Jeneen Rinks L. Deandria Klute Dr., MD, Burtis Junes, RN, Alfonso Patten, Technician Referring MD:              Medicines:                See the other procedure note for documentation of                            the administered medications Complications:            No immediate complications. Estimated Blood Loss:     Estimated blood loss: none. Procedure:                Pre-Anesthesia Assessment:                           - Prior to the procedure, a History and Physical                            was performed, and patient medications and                            allergies were reviewed. The patient's tolerance of                            previous anesthesia was also reviewed. The risks                            and benefits of the procedure and the sedation                            options and risks were discussed with the patient.                            All questions were answered, and informed consent                            was obtained. Prior Anticoagulants: The patient has                            taken no previous anticoagulant or antiplatelet  agents. ASA Grade Assessment: III - A patient with                            severe systemic disease. After reviewing the risks                            and benefits, the patient was deemed in                            satisfactory condition to undergo the procedure.                           After obtaining informed consent, the endoscope was                            passed  under direct vision. Throughout the                            procedure, the patient's blood pressure, pulse, and                            oxygen saturations were monitored continuously. The                            EG-[id number] ([id number]) scope was introduced through the                            mouth, and advanced to the second part of duodenum.                            The upper GI endoscopy was accomplished without                            difficulty. The patient tolerated the procedure                            well. The upper GI endoscopy was accomplished                            without difficulty. The patient tolerated the                            procedure well. Scope In: Scope Out: Findings:      A medium-sized hiatal hernia was present.      There is no endoscopic evidence of Barrett's esophagus, bleeding,       esophagitis, inflammation, ulcerations or varices in the entire       esophagus.      The stomach was normal.      The examined duodenum was normal. Impression:               - Medium-sized hiatal hernia.                           - Normal stomach.                           -  Normal examined duodenum.                           - No specimens collected.                           - Blood in stool without cause found on endoscopic                            exam Moderate Sedation:      Moderate (conscious) sedation was administered by the endoscopy nurse       and supervised by the endoscopist. The following parameters were       monitored: oxygen saturation, heart rate, blood pressure, respiratory       rate, EKG, adequacy of pulmonary ventilation, and response to care. Recommendation:           - Return patient to hospital ward for ongoing care.                           - Continue present medications.                           - Resume previous diet. Procedure Code(s):        --- Professional ---                           [phone],  Esophagogastroduodenoscopy, flexible,                            transoral; diagnostic, including collection of                            specimen(s) by brushing or washing, when performed                            (separate procedure) Diagnosis Code(s):        --- Professional ---                           R19.5, Other fecal abnormalities                           D50.0, Iron deficiency anemia secondary to blood                            loss (chronic)                           K44.9, Diaphragmatic hernia without obstruction or                            gangrene CPT copyright 2016 American Medical Association. All rights reserved. The codes documented in this report are preliminary and upon coder review may  be revised to meet current compliance requirements. Nancy Fetter Dr., MD 01/01/2017 11:55:29 AM This report has been signed electronically. Number of Addenda: 0

## 2017-01-01 NOTE — H&P (View-Only) (Signed)
[facility] Reason for consult: G.I. bleeding Referring Physician: [place]. PCP: Dr. Jeanie Cooks. Primary G.I.: patient is unassigned  [name] [name] is an 71 y.o. male.  HPI: he has a history of severe COPD and was on home oxygen until recently. He states that his pulmonary doctor took them off of the oxygen after he quit smoking about a year ago that he still has significant shortness of breath. He also has cardiomyopathy and has had AVR with bioprosthetic valve last ejection fraction was 30%. He is not on any anticoagulation. He went to his PCP yesterday was significantly short of breath and lab work revealed hemoglobin of 6.5. The patient readily admits that he takes [facility] Goody powders every day and has for number of years. He takes them at least 2 to 3 times a day. He denies taking anything for heartburn and indigestion. He states that he has had a problem with hemorrhoids bleeding in the past. He had the flu recently and develop some diarrhea. Whenever he has diarrhea he has bleeding and he's continued to have bleeding ever since having the flu several weeks ago. He describes a lot of bright red blood dripping into the toilet and around and mixed with his bowel movements. He has never had a colonoscopy. He does not take anything routinely for reflux. He has been transfused hemoglobin of over 10.  Past Medical History:  Diagnosis Date  . Aortic stenosis    s/p bioprosthetic valve replacement  . Cardiomyopathy 2006   EF 30%; has history of CHF  . CHF (congestive heart failure) ([place])   . COPD (chronic obstructive pulmonary disease) (H[facility])    FEV1/FVC -> 0.52 (69% of predicted)  . Osteoarthritis   . Tobacco abuse     Past Surgical History:  Procedure Laterality Date  . AORTIC VALVE REPLACEMENT     bioprosthetic, 01/2006 - cannot find record of surgery (has scar)  . TONSILLECTOMY      Family History  Problem Relation Age of Onset  . Cancer Mother     uterine cancer   . Heart failure Father   . Coronary artery disease Father   . Cancer Brother     neck/throat cancer    Social History:  reports that he has quit smoking. His smoking use included Cigarettes. He has a 12.50 pack-year smoking history. He has never used smokeless tobacco. He reports that he does not drink alcohol or use drugs.  Allergies: No Known Allergies  Medications; Prior to Admission medications   Medication Sig Start Date End Date Taking? Authorizing Provider  albuterol (PROVENTIL HFA;VENTOLIN HFA) 108 (90 Base) MCG/ACT inhaler Inhale 2 puffs into the lungs every 6 (six) hours as needed for wheezing or shortness of breath.   Yes Historical Provider, MD  albuterol (PROVENTIL) (2.5 MG/3ML) 0.083% nebulizer solution Take 2.5 mg by nebulization every 6 (six) hours as needed for wheezing or shortness of breath.   Yes Historical Provider, MD  Aspirin-Acetaminophen-Caffeine (GOODY HEADACHE PO) Take 1 packet by mouth daily as needed (headache).   Yes Historical Provider, MD  atorvastatin (LIPITOR) 40 MG tablet Take 1 tablet (40 mg total) by mouth daily. Patient taking differently: Take 40 mg by mouth at bedtime.  01/26/16  Yes Fay Records, MD  budesonide-formoterol [facility]) 160-4.5 MCG/ACT inhaler Inhale 2 puffs into the lungs 2 (two) times daily.   Yes Historical Provider, MD  Cholecalciferol (VITAMIN D PO) Take 1 tablet by mouth daily.   Yes Historical Provider, MD  Cyanocobalamin (VITAMIN B-12 PO) Take 1 tablet by mouth daily.   Yes Historical Provider, MD  dextromethorphan-guaiFENesin (MUCINEX DM) 30-600 MG per 12 hr tablet Take 1 tablet by mouth every 12 (twelve) hours as needed for cough (mucous build up).    Yes Historical Provider, MD  furosemide (LASIX) 40 MG tablet Take 1 tablet by mouth at bedtime.  11/04/16  Yes Historical Provider, MD  Ipratropium-Albuterol (COMBIVENT RESPIMAT) 20-100 MCG/ACT AERS respimat Inhale 1 puff into the lungs every 6 (six) hours as needed for wheezing or  shortness of breath.   Yes Historical Provider, MD  Multiple Vitamin (MULTIVITAMIN WITH MINERALS) TABS tablet Take 1 tablet by mouth daily. Alive   Yes Historical Provider, MD  naproxen sodium (ALEVE) 220 MG tablet Take 220 mg by mouth daily as needed (headache/ body aches).   Yes Historical Provider, MD  potassium chloride (KLOR-CON) 10 MEQ CR tablet Take 10 mEq by mouth at bedtime.    Yes Historical Provider, MD  pramipexole (MIRAPEX) 0.125 MG tablet Take 0.125 mg by mouth daily as needed (restless legs).  12/28/16  Yes Historical Provider, MD   . arformoterol  15 mcg Nebulization BID  . atorvastatin  40 mg Oral QHS  . budesonide (PULMICORT) nebulizer solution  0.5 mg Nebulization BID  . furosemide  40 mg Intravenous Once  . furosemide  40 mg Oral QHS  . ipratropium-albuterol  3 mL Nebulization STAT  . pantoprazole (PROTONIX) IV  40 mg Intravenous Q12H  . polyethylene glycol-electrolytes  4,000 mL Oral Once  . potassium chloride  40 mEq Oral Once   PRN Meds acetaminophen **OR** acetaminophen, albuterol, bisacodyl, ipratropium-albuterol, ondansetron **OR** ondansetron (ZOFRAN) IV, pramipexole Results for orders placed or performed during the hospital encounter of 12/30/16 (from the past 48 hour(s))  Type and screen [place]     Status: None (Preliminary result)   Collection Time: 12/30/16  5:11 PM  Result Value Ref Range   ABO/RH(D) A POS    Antibody Screen NEG    Sample Expiration 01/02/2017    Unit Number [id number]    Blood Component Type RED CELLS,LR    Unit division 00    Status of Unit ISSUED,FINAL    Transfusion Status OK TO TRANSFUSE    Crossmatch Result Compatible    Unit Number [id number]    Blood Component Type RED CELLS,LR    Unit division 00    Status of Unit ISSUED    Transfusion Status OK TO TRANSFUSE    Crossmatch Result Compatible   ABO/Rh     Status: None   Collection Time: 12/30/16  5:19 PM  Result Value Ref Range   ABO/RH(D) A POS    Comprehensive metabolic panel     Status: Abnormal   Collection Time: 12/30/16  5:20 PM  Result Value Ref Range   Sodium 138 135 - 145 mmol/[name]   Potassium 4.5 3.5 - 5.1 mmol/[name]   Chloride 106 101 - 111 mmol/[name]   CO2 24 22 - 32 mmol/[name]   Glucose, Bld 114 (H) 65 - 99 mg/dL   BUN 14 6 - 20 mg/dL   Creatinine, Ser 1.17 0.61 - 1.24 mg/dL   Calcium 9.1 8.9 - 10.3 mg/dL   Total Protein 6.4 ([name]) 6.5 - 8.1 g/dL   Albumin 3.8 3.5 - 5.0 g/dL   AST 21 15 - 41 U/[name]   ALT 18 17 - 63 U/[name]   Alkaline Phosphatase 37 ([name]) 38 - 126 U/[name]   Total Bilirubin  0.7 0.3 - 1.2 mg/dL   GFR calc non Af Amer >60 >60 mL/min   GFR calc Af Amer >60 >60 mL/min    Comment: (NOTE) The eGFR has been calculated using the CKD EPI equation. This calculation has not been validated in all clinical situations. eGFR's persistently <60 mL/min signify possible Chronic Kidney Disease.    Anion gap 8 5 - 15  CBC     Status: Abnormal   Collection Time: 12/30/16  5:20 PM  Result Value Ref Range   WBC 6.5 4.0 - 10.5 K/uL   RBC 2.53 ([name]) 4.22 - 5.81 MIL/uL   Hemoglobin 6.5 (LL) 13.0 - 17.0 g/dL    Comment: REPEATED TO VERIFY CRITICAL RESULT CALLED TO, READ BACK BY AND VERIFIED WITH: Bethena Midget RN [id number] GREEN R    HCT 20.5 ([name]) 39.0 - 52.0 %   MCV 81.0 78.0 - 100.0 fL   MCH 25.7 ([name]) 26.0 - 34.0 pg   MCHC 31.7 30.0 - 36.0 g/dL   RDW 15.2 11.5 - 15.5 %   Platelets 261 150 - 400 K/uL  POC occult blood, ED     Status: Abnormal   Collection Time: 12/30/16  6:14 PM  Result Value Ref Range   Fecal Occult Bld POSITIVE (A) NEGATIVE  Prepare RBC     Status: None   Collection Time: 12/30/16  7:15 PM  Result Value Ref Range   Order Confirmation ORDER PROCESSED BY BLOOD BANK   CBC with Differential/Platelet     Status: Abnormal   Collection Time: 12/31/16  7:54 AM  Result Value Ref Range   WBC 8.0 4.0 - 10.5 K/uL   RBC 3.92 ([name]) 4.22 - 5.81 MIL/uL   Hemoglobin 10.4 ([name]) 13.0 - 17.0 g/dL    Comment: POST TRANSFUSION SPECIMEN REPEATED  TO VERIFY    HCT 32.5 ([name]) 39.0 - 52.0 %   MCV 82.9 78.0 - 100.0 fL   MCH 26.5 26.0 - 34.0 pg   MCHC 32.0 30.0 - 36.0 g/dL   RDW 14.5 11.5 - 15.5 %   Platelets 270 150 - 400 K/uL   Neutrophils Relative % 93 %   Neutro Abs 7.4 1.7 - 7.7 K/uL   Lymphocytes Relative 6 %   Lymphs Abs 0.5 ([name]) 0.7 - 4.0 K/uL   Monocytes Relative 1 %   Monocytes Absolute 0.1 0.1 - 1.0 K/uL   Eosinophils Relative 0 %   Eosinophils Absolute 0.0 0.0 - 0.7 K/uL   Basophils Relative 0 %   Basophils Absolute 0.0 0.0 - 0.1 K/uL    Dg Chest Port 1 View  Result Date: 12/30/2016 CLINICAL DATA:  Acute onset of shortness of breath and generalized weakness. Decreased hemoglobin. Initial encounter. EXAM: PORTABLE CHEST 1 VIEW COMPARISON:  Chest radiograph performed 04/12/2011, and CTA of the chest performed 02/26/2016 FINDINGS: The lungs are well-aerated and clear. There is no evidence of focal opacification, pleural effusion or pneumothorax. The cardiomediastinal silhouette is borderline enlarged. The patient is status post median sternotomy, with evidence of prior CABG. An aortic valve replacement is noted. No acute osseous abnormalities are seen. IMPRESSION: 1. No acute cardiopulmonary process seen. 2. Borderline cardiomegaly. Electronically Signed   By: Garald Balding M.D.   On: 12/30/2016 22:32               Blood pressure (!) 144/80, pulse 92, temperature 97.9 F (36.6 C), temperature source Oral, resp. rate 20, height _0  (1.676 m), weight 71 kg (156 lb 9.6  oz), SpO2 98 %.  Physical exam:   General-- pleasant white male somewhat short of breath on oxygen by nasal cannula ENT-- nonicteric Neck-- supple Heart-- regular rate and rhythm without murmurs are gallops Lungs-- clear anteriorly posteriorly patient is somewhat short of breath Abdomen-- soft and nontender Psych-- alert and oriented, appropriate   Assessment: 1. G.I. bleeding. Probably due to hemorrhoids based on the fact that his BUN is  normal and is all bright red. He does take an exorbitant amount of Goody powders and for that reason I think we are looking stomach as well. Have explained to him and his sister that he needs to stop the Sachse powders. His brother had esophageal cancer but no family history of colon cancer. Think we need to clear as much of the: as we can to be sure this is not a tumor. If it is only hemorrhoids surgeons can offer him hemorrhoidectomy, banding, injection etc. 2. COPD. Quit smoking a little over a year ago seems to have fairly significant compromise 3. History of mechanical bowel prosthesis of aortic valve  Plan: we will plan EGD colonoscopy tomorrow. Hopefully his lungs will tolerate the procedures. If were not able to get completely around the: we can at least be sure he does not have a lower G.I. lesion of the internal hemorrhoids. Have discussed this in detail with the patient his sister.   [name] [name],[name] [name] 12/31/2016, 11:03 AM   This note was created using voice recognition software and minor errors may Have occurred unintentionally. Pager: [phone] If no answer or after hours call [phone]

## 2017-01-01 NOTE — Op Note (Signed)
[facility] Patient Name: [name] [name] Procedure Date : 01/01/2017 MRN: [id number] Attending MD: Nancy Fetter Dr., MD Date of Birth: [birth year] CSN: [id number] Age: 71 Admit Type: Inpatient Procedure:                Colonoscopy Indications:              Heme positive stool, Iron deficiency anemia                            secondary to chronic blood loss Providers:                Jeneen Rinks L. Praise Dolecki Dr., MD, Burtis Junes, RN, Alfonso Patten, Technician Referring MD:             Dr Lorra Hals Medicines:                Fentanyl 100 micrograms IV, Midazolam 9 mg IV Complications:            No immediate complications. Estimated Blood Loss:     Estimated blood loss: none. Procedure:                Pre-Anesthesia Assessment:                           - Prior to the procedure, a History and Physical                            was performed, and patient medications and                            allergies were reviewed. The patient's tolerance of                            previous anesthesia was also reviewed. The risks                            and benefits of the procedure and the sedation                            options and risks were discussed with the patient.                            All questions were answered, and informed consent                            was obtained. Prior Anticoagulants: The patient has                            taken no previous anticoagulant or antiplatelet                            agents. ASA Grade Assessment: III - A patient with  severe systemic disease. After reviewing the risks                            and benefits, the patient was deemed in                            satisfactory condition to undergo the procedure.                           - Prior to the procedure, a History and Physical                            was performed, and patient medications and                             allergies were reviewed. The patient's tolerance of                            previous anesthesia was also reviewed. The risks                            and benefits of the procedure and the sedation                            options and risks were discussed with the patient.                            All questions were answered, and informed consent                            was obtained. Prior Anticoagulants: The patient has                            taken no previous anticoagulant or antiplatelet                            agents. ASA Grade Assessment: III - A patient with                            severe systemic disease. After reviewing the risks                            and benefits, the patient was deemed in                            satisfactory condition to undergo the procedure.                           After obtaining informed consent, the colonoscope                            was passed under direct vision. Throughout the  procedure, the patient's blood pressure, pulse, and                            oxygen saturations were monitored continuously. The                            EC-[id number] ([id number]) scope was introduced through                            the anus and advanced to the the cecum, identified                            by appendiceal orifice and ileocecal valve. The                            colonoscopy was performed without difficulty. The                            patient tolerated the procedure well. The quality                            of the bowel preparation was good. The ileocecal                            valve, appendiceal orifice, and rectum were                            photographed. Scope In: 11:24:10 AM Scope Out: 11:44:46 AM Scope Withdrawal Time: 0 hours 8 minutes 22 seconds  Total Procedure Duration: 0 hours 20 minutes 36 seconds  Findings:      The perianal and digital rectal examinations were normal.       There is no endoscopic evidence of bleeding, diverticula, mass, polyps,       ulcerations or tumor in the entire colon.      Internal hemorrhoids were found during retroflexion. The hemorrhoids       were medium-sized and Grade I (internal hemorrhoids that do not       prolapse). Impression:               - Internal hemorrhoids.                           - No specimens collected.                           - Blood found in stool Moderate Sedation:      Moderate (conscious) sedation was administered by the endoscopy nurse       and supervised by the endoscopist. The following parameters were       monitored: oxygen saturation, heart rate, blood pressure, respiratory       rate, EKG, adequacy of pulmonary ventilation, and response to care. Recommendation:           - Return patient to hospital ward for ongoing care.                           -  Resume previous diet.                           - Repeat colonoscopy in 10 years for screening                            purposes.                           - Continue present medications. Procedure Code(s):        --- Professional ---                           [phone], Colonoscopy, flexible; diagnostic, including                            collection of specimen(s) by brushing or washing,                            when performed (separate procedure) Diagnosis Code(s):        --- Professional ---                           D50.0, Iron deficiency anemia secondary to blood                            loss (chronic)                           R19.5, Other fecal abnormalities                           K64.0, First degree hemorrhoids CPT copyright 2016 [employer]. All rights reserved. The codes documented in this report are preliminary and upon coder review may  be revised to meet current compliance requirements. Nancy Fetter Dr., MD 01/01/2017 12:00:30 PM This report has been signed electronically. Number of Addenda: 0

## 2017-01-01 NOTE — Interval H&P Note (Signed)
History and Physical Interval Note:  01/01/2017 11:12 AM  [name] [name]  has presented today for surgery, with the diagnosis of gi bleed  The various methods of treatment have been discussed with the patient and family. After consideration of risks, benefits and other options for treatment, the patient has consented to  Procedure(s): COLONOSCOPY (N/A) ESOPHAGOGASTRODUODENOSCOPY (EGD) (N/A) as a surgical intervention .  The patient's history has been reviewed, patient examined, no change in status, stable for surgery.  I have reviewed the patient's chart and labs.  Questions were answered to the patient's satisfaction.     Gaetana Kawahara JR,Andrzej Scully L

## 2017-01-01 NOTE — Discharge Summary (Signed)
Physician Discharge Summary  [name] [name] DOB: 12-Dec-1945 DOA: 12/30/2016  PCP: Philis Fendt, MD  Admit date: 12/30/2016 Discharge date: 01/01/2017   Recommendations for Outpatient Follow-Up:   1. Cbc 1 week- start Fe   Discharge Diagnosis:   Principal Problem:   GI bleed Active Problems:   Dyslipidemia   RESTLESS LEG SYNDROME   COPD with acute exacerbation (HCC)   Symptomatic anemia   Discharge disposition:  Home:  Discharge Condition: Improved.  Diet recommendation: Low sodium, heart healthy  Wound care: None.   History of Present Illness:   [name] [name] is a 71 y.o. male with medical history significant of COPD, AS s/p bioprosthetic valve replacement, cardiomyopathy last EF 30%, HLD, RLS ;who presents with complaints of abnormal lab work. Patient had gone to his PCP office today for generalized weakness and worsening shortness of breath. On a daily basis patient reports utilizing inhalers more than prescribed. For the last month patient notes that he's been dealing with diarrhea and what he believes to be hemorrhoids. Reporting liquid stools with squirting blood after each bowel movement. He has tried nothing for the symptoms. Associated symptoms include lightheadedness and near syncope. Lab work was obtained at the PCPs office and patient was notified that his hemoglobin was 6.5 and that he should come to the ED for further evaluation. He does have a history of intermittently use BC Goody powders and naproxen. Patient is followed by Dr. Dorris Carnes of cardiology and was scheduled to have a echocardiogram on the 17th of this month.   Hospital Course by Problem:   Severe acute anemia due to GI blood loss. History of bright red blood per rectum for a while, has history of hemorrhoids but also takes Goody powder etc. Status post 2 units of packed RBC transfusion on 12/30/2016,  -s/p EGD/colonoscopy appears to be hemorrhoidal bleeding  Advanced Chronic  COPD. Does appear to have advanced disease with minimal air movement bilaterally but no wheezing, does appear to be mildly short of breath, he adds that this is his baseline, have applied 2 L nasal cannula oxygen apparently previously he was on oxygen, added flutter valve -off O2 and patient says breathing is the best it has been in a long time.  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 No rales currently, since he received 2 units of packed RBC yesterday we gave him IV Lasix and continue home dose oral Lasix -outpatient echo for 4/17 already scheduled  Restless leg syndrome. No acute issues continue Mirapex.  Dyslipidemia. On statin continue.     Medical Consultants:    GI   Discharge Exam:   Vitals:   01/01/17 1150 01/01/17 1254  BP: (!) 99/41 116/66  Pulse: 61 67  Resp: 16 17  Temp:  98.1 F (36.7 C)   Vitals:   01/01/17 1140 01/01/17 1145 01/01/17 1150 01/01/17 1254  BP: 110/67 (!) 99/59 (!) 99/41 116/66  Pulse: 63 66 61 67  Resp: 17 17 16 17   Temp:    98.1 F (36.7 C)  TempSrc:    Oral  SpO2: 100% 100% 99% 94%  Weight:      Height:        Gen:  NAD- anxious to go home   The results of significant diagnostics from this hospitalization (including imaging, microbiology, ancillary and laboratory) are listed below for reference.     Procedures and Diagnostic Studies:   Dg Chest [facility]  1 View  Result Date: 12/30/2016 CLINICAL DATA:  Acute onset of shortness of breath and generalized weakness. Decreased hemoglobin. Initial encounter. EXAM: PORTABLE CHEST 1 VIEW COMPARISON:  Chest radiograph performed 04/12/2011, and CTA of the chest performed 02/26/2016 FINDINGS: The lungs are well-aerated and clear. There is no evidence of focal opacification, pleural effusion or pneumothorax. The cardiomediastinal silhouette is borderline enlarged. The patient is status post median sternotomy, with evidence of  prior CABG. An aortic valve replacement is noted. No acute osseous abnormalities are seen. IMPRESSION: 1. No acute cardiopulmonary process seen. 2. Borderline cardiomegaly. Electronically Signed   By: Garald Balding M.D.   On: 12/30/2016 22:32     Labs:   Basic Metabolic Panel:  Recent Labs Lab 12/30/16 1054 12/30/16 1720  NA 139 138  K 4.5 4.5  CL 102 106  CO2 20 24  GLUCOSE 101* 114*  BUN 16 14  CREATININE 0.98 1.17  CALCIUM 8.5* 9.1   GFR Estimated Creatinine Clearance: 53.8 mL/min (by C-G formula based on SCr of 1.17 mg/dL). Liver Function Tests:  Recent Labs Lab 12/30/16 1720  AST 21  ALT 18  ALKPHOS 37*  BILITOT 0.7  PROT 6.4*  ALBUMIN 3.8   No results for input(s): LIPASE, AMYLASE in the last 168 hours. No results for input(s): AMMONIA in the last 168 hours. Coagulation profile No results for input(s): INR, PROTIME in the last 168 hours.  CBC:  Recent Labs Lab 12/30/16 1054 12/30/16 1720 12/31/16 0754 01/01/17 0314  WBC 7.9 6.5 8.0 8.1  NEUTROABS  --   --  7.4  --   HGB  --  6.5* 10.4* 9.1*  HCT 20.7* 20.5* 32.5* 27.6*  MCV 81 81.0 82.9 82.4  PLT 295 261 270 236   Cardiac Enzymes: No results for input(s): CKTOTAL, CKMB, CKMBINDEX, TROPONINI in the last 168 hours. BNP: Invalid input(s): POCBNP CBG: No results for input(s): GLUCAP in the last 168 hours. D-Dimer No results for input(s): DDIMER in the last 72 hours. Hgb A1c No results for input(s): HGBA1C in the last 72 hours. Lipid Profile  Recent Labs  12/30/16 1054  CHOL 173  HDL 66  LDLCALC 93  TRIG 72  CHOLHDL 2.6   Thyroid function studies  Recent Labs  12/30/16 1054  TSH 1.600   Anemia work up  Recent Labs  12/31/16 1215  VITAMINB12 576  FOLATE 21.9  FERRITIN 5*  TIBC 564*  IRON 27*  RETICCTPCT 2.4   Microbiology No results found for this or any previous visit (from the past 240 hour(s)).   Discharge Instructions:   Discharge Instructions    Diet - low  sodium heart healthy    Complete by:  As directed    Increase activity slowly    Complete by:  As directed      Allergies as of 01/01/2017   No Known Allergies     Medication List    STOP taking these medications   ALEVE 220 MG tablet Generic drug:  naproxen sodium   GOODY HEADACHE PO     TAKE these medications   albuterol 108 (90 Base) MCG/ACT inhaler Commonly known as:  PROVENTIL HFA;VENTOLIN HFA Inhale 2 puffs into the lungs every 6 (six) hours as needed for wheezing or shortness of breath.   albuterol (2.5 MG/3ML) 0.083% nebulizer solution Commonly known as:  PROVENTIL Take 2.5 mg by nebulization every 6 (six) hours as needed for wheezing or shortness of breath.   atorvastatin 40 MG tablet Commonly known  as:  LIPITOR Take 1 tablet (40 mg total) by mouth daily. What changed:  when to take this   budesonide-formoterol 160-4.5 MCG/ACT inhaler Commonly known as:  SYMBICORT Inhale 2 puffs into the lungs 2 (two) times daily.   COMBIVENT RESPIMAT 20-100 MCG/ACT Aers respimat Generic drug:  Ipratropium-Albuterol Inhale 1 puff into the lungs every 6 (six) hours as needed for wheezing or shortness of breath.   dextromethorphan-guaiFENesin 30-600 MG 12hr tablet Commonly known as:  MUCINEX DM Take 1 tablet by mouth every 12 (twelve) hours as needed for cough (mucous build up).   furosemide 40 MG tablet Commonly known as:  LASIX Take 1 tablet by mouth at bedtime.   multivitamin with minerals Tabs tablet Take 1 tablet by mouth daily. Alive   pantoprazole 40 MG tablet Commonly known as:  PROTONIX Take 1 tablet (40 mg total) by mouth daily.   potassium chloride 10 MEQ CR tablet Commonly known as:  KLOR-CON Take 10 mEq by mouth at bedtime.   pramipexole 0.125 MG tablet Commonly known as:  MIRAPEX Take 0.125 mg by mouth daily as needed (restless legs).   predniSONE 10 MG tablet Commonly known as:  DELTASONE 40 mg x 2 days, then 30 mg x 2 days, then 20 mg x 2 days,  then 10 mg x 2 days and stop   VITAMIN B-12 PO Take 1 tablet by mouth daily.   VITAMIN D PO Take 1 tablet by mouth daily.         Time coordinating discharge: 35 min  Signed:  Tyrese Capriotti U Parv Manthey   [facility] 01/01/2017, 1:05 PM

## 2017-01-01 NOTE — Progress Notes (Signed)
Nsg Discharge Note  Admit Date:  12/30/2016 Discharge date: 01/01/2017   [name] [name] to be D/C'd Home per MD order.  AVS completed.  Copy for chart, and copy for patient signed, and dated. Patient/caregiver able to verbalize understanding.  Discharge Medication: Allergies as of 01/01/2017   No Known Allergies     Medication List    STOP taking these medications   ALEVE 220 MG tablet Generic drug:  naproxen sodium   GOODY HEADACHE PO     TAKE these medications   albuterol 108 (90 Base) MCG/ACT inhaler Commonly known as:  PROVENTIL HFA;VENTOLIN HFA Inhale 2 puffs into the lungs every 6 (six) hours as needed for wheezing or shortness of breath.   albuterol (2.5 MG/3ML) 0.083% nebulizer solution Commonly known as:  PROVENTIL Take 2.5 mg by nebulization every 6 (six) hours as needed for wheezing or shortness of breath.   atorvastatin 40 MG tablet Commonly known as:  LIPITOR Take 1 tablet (40 mg total) by mouth daily. What changed:  when to take this   budesonide-formoterol 160-4.5 MCG/ACT inhaler Commonly known as:  SYMBICORT Inhale 2 puffs into the lungs 2 (two) times daily.   COMBIVENT RESPIMAT 20-100 MCG/ACT Aers respimat Generic drug:  Ipratropium-Albuterol Inhale 1 puff into the lungs every 6 (six) hours as needed for wheezing or shortness of breath.   dextromethorphan-guaiFENesin 30-600 MG 12hr tablet Commonly known as:  MUCINEX DM Take 1 tablet by mouth every 12 (twelve) hours as needed for cough (mucous build up).   furosemide 40 MG tablet Commonly known as:  LASIX Take 1 tablet by mouth at bedtime.   multivitamin with minerals Tabs tablet Take 1 tablet by mouth daily. Alive   pantoprazole 40 MG tablet Commonly known as:  PROTONIX Take 1 tablet (40 mg total) by mouth daily.   potassium chloride 10 MEQ CR tablet Commonly known as:  KLOR-CON Take 10 mEq by mouth at bedtime.   pramipexole 0.125 MG tablet Commonly known as:  MIRAPEX Take 0.125 mg by  mouth daily as needed (restless legs).   predniSONE 10 MG tablet Commonly known as:  DELTASONE 40 mg x 2 days, then 30 mg x 2 days, then 20 mg x 2 days, then 10 mg x 2 days and stop   VITAMIN B-12 PO Take 1 tablet by mouth daily.   VITAMIN D PO Take 1 tablet by mouth daily.       Discharge Assessment: Vitals:   01/01/17 1150 01/01/17 1254  BP: (!) 99/41 116/66  Pulse: 61 67  Resp: 16 17  Temp:  98.1 F (36.7 C)   Skin clean, dry and intact without evidence of skin break down, no evidence of skin tears noted. IV catheter discontinued intact. Site without signs and symptoms of complications - no redness or edema noted at insertion site, patient denies c/o pain - only slight tenderness at site.  Dressing with slight pressure applied.  D/c Instructions-Education: Discharge instructions given to patient/family with verbalized understanding. D/c education completed with patient/family including follow up instructions, medication list, d/c activities limitations if indicated, with other d/c instructions as indicated by MD - patient able to verbalize understanding, all questions fully answered. Patient instructed to return to ED, call 911, or call MD for any changes in condition.  Patient escorted via [place], and D/C home via private auto.  Salley Slaughter, RN 01/01/2017 2:32 PM

## 2017-01-02 SURGERY — EGD (ESOPHAGOGASTRODUODENOSCOPY)
Anesthesia: Moderate Sedation

## 2017-01-05 ENCOUNTER — Encounter (HOSPITAL_COMMUNITY): Payer: Self-pay | Admitting: Gastroenterology

## 2017-01-13 NOTE — Addendum Note (Signed)
Addended by: Leanord Asal T on: 01/13/2017 01:58 PM   Modules accepted: Orders

## 2017-01-17 ENCOUNTER — Ambulatory Visit (HOSPITAL_COMMUNITY): Payer: Medicare Other | Attending: Cardiovascular Disease

## 2017-01-17 ENCOUNTER — Other Ambulatory Visit: Payer: Self-pay

## 2017-01-17 DIAGNOSIS — I359 Nonrheumatic aortic valve disorder, unspecified: Secondary | ICD-10-CM

## 2017-01-17 DIAGNOSIS — I350 Nonrheumatic aortic (valve) stenosis: Secondary | ICD-10-CM | POA: Insufficient documentation

## 2017-01-17 DIAGNOSIS — I517 Cardiomegaly: Secondary | ICD-10-CM | POA: Diagnosis not present

## 2017-01-17 DIAGNOSIS — I340 Nonrheumatic mitral (valve) insufficiency: Secondary | ICD-10-CM | POA: Diagnosis not present

## 2017-02-20 ENCOUNTER — Other Ambulatory Visit: Payer: Medicare Other

## 2017-02-24 ENCOUNTER — Other Ambulatory Visit: Payer: Medicare Other | Admitting: *Deleted

## 2017-02-24 ENCOUNTER — Encounter (INDEPENDENT_AMBULATORY_CARE_PROVIDER_SITE_OTHER): Payer: Self-pay

## 2017-02-24 DIAGNOSIS — I10 Essential (primary) hypertension: Secondary | ICD-10-CM

## 2017-02-24 DIAGNOSIS — I712 Thoracic aortic aneurysm, without rupture, unspecified: Secondary | ICD-10-CM

## 2017-02-24 LAB — BASIC METABOLIC PANEL
BUN/Creatinine Ratio: 12 (ref 10–24)
BUN: 13 mg/dL (ref 8–27)
CO2: 26 mmol/L (ref 18–29)
Calcium: 9.4 mg/dL (ref 8.6–10.2)
Chloride: 101 mmol/L (ref 96–106)
Creatinine, Ser: 1.08 mg/dL (ref 0.76–1.27)
GFR calc Af Amer: 81 mL/min/{1.73_m2} (ref >59)
GFR calc non Af Amer: 70 mL/min/{1.73_m2} (ref >59)
Glucose: 81 mg/dL (ref 65–99)
Potassium: 3.7 mmol/L (ref 3.5–5.2)
Sodium: 142 mmol/L (ref 134–144)

## 2017-02-24 NOTE — Progress Notes (Signed)
327

## 2017-02-28 ENCOUNTER — Other Ambulatory Visit: Payer: Medicare Other

## 2017-03-03 ENCOUNTER — Ambulatory Visit (INDEPENDENT_AMBULATORY_CARE_PROVIDER_SITE_OTHER)
Admission: RE | Admit: 2017-03-03 | Discharge: 2017-03-03 | Disposition: A | Discharge status: Home / Self Care | Payer: Medicare Other | Source: Ambulatory Visit | Attending: Internal Medicine | Admitting: Internal Medicine

## 2017-03-03 DIAGNOSIS — I712 Thoracic aortic aneurysm, without rupture, unspecified: Secondary | ICD-10-CM

## 2017-03-03 MED ORDER — IOPAMIDOL (ISOVUE-370) INJECTION 76%
100.0000 mL | Freq: Once | INTRAVENOUS | Status: AC | PRN
  Administered 2017-03-03: 80 mL via INTRAVENOUS

## 2017-03-06 ENCOUNTER — Telehealth: Payer: Self-pay | Admitting: Nurse Practitioner

## 2017-03-06 DIAGNOSIS — I712 Thoracic aortic aneurysm, without rupture, unspecified: Secondary | ICD-10-CM

## 2017-03-06 NOTE — Telephone Encounter (Signed)
-----   Message from Dorris Carnes V, MD sent at 03/06/2017 12:11 PM EDT ----- Aorta is dilated  Stable from previous exam WOuld recomm that he get established by [facility] to long term f/u of aorta.

## 2017-03-06 NOTE — Telephone Encounter (Signed)
Results and plan of care reviewed with patient who verbalized understanding and agreement. He is aware that someone from [facility] will call him to schedule an appointment. Referral order in epic.

## 2017-03-09 ENCOUNTER — Encounter: Payer: Medicare Other | Admitting: Vascular Surgery

## 2017-03-23 ENCOUNTER — Encounter: Payer: Self-pay | Admitting: Thoracic Surgery (Cardiothoracic Vascular Surgery)

## 2017-03-23 ENCOUNTER — Institutional Professional Consult (permissible substitution) (INDEPENDENT_AMBULATORY_CARE_PROVIDER_SITE_OTHER): Payer: Medicare Other | Admitting: Thoracic Surgery (Cardiothoracic Vascular Surgery)

## 2017-03-23 VITALS — BP 147/84 | HR 64 | Resp 20 | Ht 66.0 in | Wt 157.0 lb

## 2017-03-23 DIAGNOSIS — I712 Thoracic aortic aneurysm, without rupture, unspecified: Secondary | ICD-10-CM

## 2017-03-23 NOTE — Progress Notes (Signed)
[address]       [place] [address]             [phone]    HPI: [name] is sent for consultation regarding an aortic root and ascending aneurysm.  Mr. Edelen is a 71 year old gentleman with a past medical history significant for aortic stenosis status post aortic valve replacement with a tissue valve in 2007, tobacco abuse (quit 2 years ago), COPD, arthritis, hypertension, back pain, gastrointestinal bleed with acute blood loss anemia, and restless leg syndrome. I do not have his operative note from 2007 available for review. I suspect he probably had a bicuspid valve.  He recently was admitted to the hospital with a gastrointestinal bleed. He does have severe COPD and was on home oxygen until recently. He quit smoking about 2 years ago. He does get short of breath with exertion. He says on some days he can walk up to 4 miles and on other days he had stop and rest after a couple of minutes. He uses his inhaler about 6-8 times a day. He also has some problems with arthritis and back pain.  He had an echocardiogram in April which showed the prosthetic valve was functioning well. Mean gradient on the valve had increased from 17-24 mmHg.   He denies any chest pain, pressure, or tightness with exertion or at rest.  Past Medical History:  Diagnosis Date  . Aortic stenosis    s/p bioprosthetic valve replacement  . Cardiomyopathy 2006   EF 30%; has history of CHF  . CHF (congestive heart failure) ([place])   . COPD (chronic obstructive pulmonary disease) ([facility])    FEV1/FVC -> 0.52 (69% of predicted)  . Osteoarthritis   . Tobacco abuse    Past Surgical History:  Procedure Laterality Date  . AORTIC VALVE REPLACEMENT     bioprosthetic, 01/2006 - cannot find record of surgery (has scar)  . COLONOSCOPY N/A 01/01/2017   Procedure: COLONOSCOPY;  Surgeon: Laurence Spates, MD;  Location: [facility] ENDOSCOPY;  Service: Endoscopy;  Laterality: N/A;  . ESOPHAGOGASTRODUODENOSCOPY N/A  01/01/2017   Procedure: ESOPHAGOGASTRODUODENOSCOPY (EGD);  Surgeon: Laurence Spates, MD;  Location: [facility] ENDOSCOPY;  Service: Endoscopy;  Laterality: N/A;  . TONSILLECTOMY      Current Outpatient Prescriptions  Medication Sig Dispense Refill  . albuterol (PROVENTIL HFA;VENTOLIN HFA) 108 (90 Base) MCG/ACT inhaler Inhale 2 puffs into the lungs every 6 (six) hours as needed for wheezing or shortness of breath.    Marland Kitchen albuterol (PROVENTIL) (2.5 MG/3ML) 0.083% nebulizer solution Take 2.5 mg by nebulization every 6 (six) hours as needed for wheezing or shortness of breath.    Marland Kitchen atorvastatin (LIPITOR) 40 MG tablet Take 1 tablet (40 mg total) by mouth daily. (Patient taking differently: Take 40 mg by mouth at bedtime. ) 90 tablet 3  . budesonide-formoterol (SYMBICORT) 160-4.5 MCG/ACT inhaler Inhale 2 puffs into the lungs 2 (two) times daily.    . Cholecalciferol (VITAMIN D PO) Take 1 tablet by mouth daily.    . Cyanocobalamin (VITAMIN B-12 PO) Take 1 tablet by mouth daily.    Marland Kitchen dextromethorphan-guaiFENesin (MUCINEX DM) 30-600 MG per 12 hr tablet Take 1 tablet by mouth every 12 (twelve) hours as needed for cough (mucous build up).     . furosemide (LASIX) 40 MG tablet Take 1 tablet by mouth at bedtime.     . Ipratropium-Albuterol (COMBIVENT RESPIMAT) 20-100 MCG/ACT AERS respimat Inhale 1 puff into the lungs every 6 (six) hours  as needed for wheezing or shortness of breath.    . Multiple Vitamin (MULTIVITAMIN WITH MINERALS) TABS tablet Take 1 tablet by mouth daily. Alive    . pantoprazole (PROTONIX) 40 MG tablet Take 1 tablet (40 mg total) by mouth daily. 30 tablet 0  . potassium chloride (KLOR-CON) 10 MEQ CR tablet Take 10 mEq by mouth at bedtime.     . pramipexole (MIRAPEX) 0.125 MG tablet Take 0.125 mg by mouth daily as needed (restless legs).     . predniSONE (DELTASONE) 10 MG tablet 40 mg x 2 days, then 30 mg x 2 days, then 20 mg x 2 days, then 10 mg x 2 days and stop (Patient not taking: Reported on  03/23/2017) 20 tablet 0   No current facility-administered medications for this visit.    Family History  Problem Relation Age of Onset  . Cancer Mother        uterine cancer  . Heart failure Father   . Coronary artery disease Father   . Cancer Brother        neck/throat cancer   Social History   Social History  . Marital status: Single    Spouse name: N/A  . Number of children: 0  . Years of education: N/A   Occupational History  . Retired     Dealer   Social History Main Topics  . Smoking status: Former Smoker    Packs/day: 0.25    Years: 50.00    Types: Cigarettes  . Smokeless tobacco: Never Used  . Alcohol use No     Comment: states he quit many years ago  . Drug use: No     Comment: previous h/o cocaine use, denies since 2006  . Sexual activity: Not on file   Other Topics Concern  . Not on file   Social History Narrative  . No narrative on file  Updated smoking history in EPIC  Review of systems Shortness of breath with exertion see history of present illness Occasional dizzy spells No change in appetite or weight loss Reflux with heartburn Occasional diarrhea Arthritis with chronic back pain Bruises easily All other systems are negative  Physical Exam BP (!) 147/84   Pulse 64   Resp 20   Ht 5\' 6"  (1.676 m)   Wt 157 lb (71.2 kg)   SpO2 91% Comment: RA  BMI 25.54 kg/m  71 year old man in no acute distress does appear chronically ill Walks with cane Alert and oriented 3 with no focal neurologic deficits No carotid bruits Cardiac regular rate and rhythm with a 2/6 systolic murmur Lungs diminished breath sounds bilaterally no wheezing Sternal incision well-healed No peripheral edema  Diagnostic Tests: Echocardiogram 01/17/2017 Study Conclusions  - Left ventricle: The cavity size was normal. There was moderate   concentric hypertrophy. Systolic function was moderately reduced.   The estimated ejection fraction was in the range of 35% to  40%.   Diffuse hypokinesis. Doppler parameters are consistent with   abnormal left ventricular relaxation (grade 1 diastolic   dysfunction). Doppler parameters are consistent with   indeterminate ventricular filling pressure. - Aortic valve: A bioprosthesis was present. There was mild   stenosis. There was no regurgitation. Peak velocity (S): 303   cm/s. Mean gradient (S): 24 mm Hg. - Aorta: Ascending aortic diameter: 46 mm (S). - Ascending aorta: The ascending aorta was moderately dilated. - Mitral valve: Transvalvular velocity was within the normal range.   There was no evidence for stenosis. There was  mild regurgitation. - Right ventricle: The cavity size was normal. Wall thickness was   normal. Systolic function was normal. - Atrial septum: No defect or patent foramen ovale was identified   by color flow Doppler. - Tricuspid valve: There was mild regurgitation. - Pulmonary arteries: Systolic pressure was within the normal   range. PA peak pressure: 23 mm Hg (S).  Impressions:  - Since the echo 01/02/15, bioprosthetic aortic valve mean gradient   has increased from 17 mmHg to 24 mmHg. Ascending aorta diameter   is unchanged.  CT ANGIOGRAPHY CHEST WITH CONTRAST  TECHNIQUE: Multidetector CT imaging of the chest was performed using the standard protocol during bolus administration of intravenous contrast. Multiplanar CT image reconstructions and MIPs were obtained to evaluate the vascular anatomy.  CONTRAST:  80 mL of Isovue 370 intravenously.  COMPARISON:  CT scan of Feb 26, 2016.  FINDINGS: Cardiovascular: Aortic root measures 5.2 cm in diameter which is stable compared to prior exam. 4.5 cm ascending thoracic aortic aneurysm is noted which is not significantly changed compared to prior exam. Transverse aortic arch measures 2.9 cm. Proximal descending thoracic aorta measures 3.5 cm. No dissection is noted. Atherosclerosis is noted. Great vessels are widely patent  without significant stenosis. Status post aortic valve replacement and coronary artery bypass graft.  Mediastinum/Nodes: No enlarged mediastinal, hilar, or axillary lymph nodes. Thyroid gland, trachea, and esophagus demonstrate no significant findings.  Lungs/Pleura: No pneumothorax or pleural effusion is noted. Stable mild emphysematous disease is noted in the upper lobes bilaterally.  Upper Abdomen: No acute abnormality.  Musculoskeletal: No chest wall abnormality. No acute or significant osseous findings.  Review of the MIP images confirms the above findings.  IMPRESSION: Stable aneurysmal dilatation of aortic root at 5.2 cm and of the ascending thoracic aorta at 4.5 cm. Ascending thoracic aortic aneurysm. Recommend semi-annual imaging followup by CTA or MRA and referral to cardiothoracic surgery if not already obtained. This recommendation follows 2010 ACCF/AHA/AATS/ACR/ASA/SCA/SCAI/SIR/STS/SVM Guidelines for the Diagnosis and Management of Patients With Thoracic Aortic Disease. Circulation. 2010; 121: E703-J009.  Stable mild emphysematous disease is noted.   Electronically Signed   By: Marijo Conception, M.D.   On: 03/03/2017 13:06 I personally reviewed the CT images and concur with the findings noted above.  Impression: [name] is a 71 year old gentleman with a history of bioprosthetic aortic valve replacement 10 or 11 years ago. I do not have access to his old records but I suspect he had a bicuspid aortic valve. The bioprosthetic valve still functioning well although the gradient was higher on his most recent echo than it was on his previous one.  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 It was 5.1 cm in 2016. The mid ascending aortic at the level of the right pulmonary artery is unchanged at 4.2 cm. There is no indication for surgery at this time, but he does need  follow-up. I recommended to him that we repeat a CT angiogram in 6 months given the size of the aortic root.  COPD- managed by Dr. Melvyn Novas. Not currently on oxygen.  Hypertension- he is on Lasix but no other blood pressure medications currently. His blood pressure was mildly elevated today. I recommended that he get a blood pressure cuff and check himself on a regular basis. If he remains above 381 systolic be started on medication.  Plan:  Return in 6 months with CT angiogram of chest  Melrose Nakayama, MD [facility] [phone]

## 2017-03-23 NOTE — Progress Notes (Signed)
Cardiology Office Note   Date:  03/24/2017   ID:  [name] [name] 1946/03/25, MRN [id number]  PCP:  Nolene Ebbs, MD  Cardiologist:   Dorris Carnes, MD   F/u of AV dz       History of Present Illness: [name] [name] is a 71 y.o. male with a history of AV dz (s/p AVR in 2007 )  I saw him in March 2018    I recomm echo  AV prosthesis OK  Pumping function mildly decreased    Uses his inhalers a lot  Gets SOB with walking      Current Meds  Medication Sig  . albuterol (PROVENTIL HFA;VENTOLIN HFA) 108 (90 Base) MCG/ACT inhaler Inhale 2 puffs into the lungs every 6 (six) hours as needed for wheezing or shortness of breath.  Marland Kitchen albuterol (PROVENTIL) (2.5 MG/3ML) 0.083% nebulizer solution Take 2.5 mg by nebulization every 6 (six) hours as needed for wheezing or shortness of breath.  Marland Kitchen atorvastatin (LIPITOR) 40 MG tablet Take 1 tablet (40 mg total) by mouth daily. (Patient taking differently: Take 40 mg by mouth at bedtime. )  . budesonide-formoterol (SYMBICORT) 160-4.5 MCG/ACT inhaler Inhale 2 puffs into the lungs 2 (two) times daily.  . Cholecalciferol (VITAMIN D PO) Take 1 tablet by mouth daily.  . Cyanocobalamin (VITAMIN B-12 PO) Take 1 tablet by mouth daily.  Marland Kitchen dextromethorphan-guaiFENesin (MUCINEX DM) 30-600 MG per 12 hr tablet Take 1 tablet by mouth every 12 (twelve) hours as needed for cough (mucous build up).   . furosemide (LASIX) 40 MG tablet Take 1 tablet by mouth at bedtime.   . Ipratropium-Albuterol (COMBIVENT RESPIMAT) 20-100 MCG/ACT AERS respimat Inhale 1 puff into the lungs every 6 (six) hours as needed for wheezing or shortness of breath.  . Multiple Vitamin (MULTIVITAMIN WITH MINERALS) TABS tablet Take 1 tablet by mouth daily. Alive  . pantoprazole (PROTONIX) 40 MG tablet Take 1 tablet (40 mg total) by mouth daily.  . potassium chloride (KLOR-CON) 10 MEQ CR tablet Take 10 mEq by mouth at bedtime.   . pramipexole (MIRAPEX) 0.125 MG tablet Take 0.125 mg by mouth daily  as needed (restless legs).   . predniSONE (DELTASONE) 10 MG tablet 40 mg x 2 days, then 30 mg x 2 days, then 20 mg x 2 days, then 10 mg x 2 days and stop     Allergies:   Patient has no known allergies.   Past Medical History:  Diagnosis Date  . Aortic stenosis    s/p bioprosthetic valve replacement  . Cardiomyopathy 2006   EF 30%; has history of CHF  . CHF (congestive heart failure) ([place])   . COPD (chronic obstructive pulmonary disease) ([facility])    FEV1/FVC -> 0.52 (69% of predicted)  . Osteoarthritis   . Tobacco abuse     Past Surgical History:  Procedure Laterality Date  . AORTIC VALVE REPLACEMENT     bioprosthetic, 01/2006 - cannot find record of surgery (has scar)  . COLONOSCOPY N/A 01/01/2017   Procedure: COLONOSCOPY;  Surgeon: Laurence Spates, MD;  Location: [facility] ENDOSCOPY;  Service: Endoscopy;  Laterality: N/A;  . ESOPHAGOGASTRODUODENOSCOPY N/A 01/01/2017   Procedure: ESOPHAGOGASTRODUODENOSCOPY (EGD);  Surgeon: Laurence Spates, MD;  Location: [facility] ENDOSCOPY;  Service: Endoscopy;  Laterality: N/A;  . TONSILLECTOMY       Social History:  The patient  reports that he quit smoking about 17 months ago. His smoking use included Cigarettes. He has a 50.00 pack-year smoking history. He has never  used smokeless tobacco. He reports that he does not drink alcohol or use drugs.   Family History:  The patient's family history includes Cancer in his brother and mother; Coronary artery disease in his father; Heart failure in his father.    ROS:  Please see the history of present illness. All other systems are reviewed and  Negative to the above problem except as noted.    PHYSICAL EXAM: VS:  BP 132/68   Pulse 71   Ht 5\' 6"  (1.676 m)   Wt 73.8 kg (162 lb 12.8 oz)   SpO2 92%   BMI 26.28 kg/m   GEN: Well nourished, well developed, in no acute distress  HEENT: normal  Neck: no JVD, carotid bruits, or masses Cardiac: RRR; Gr II/VI systolic murmur , rubs, or gallops,no edema  Respiratory: Some  decreased airflow  GI: soft, nontender, nondistended, + BS  No hepatomegaly  MS: no deformity Moving all extremities   Skin: warm and dry, no rash Neuro:  Strength and sensation are intact Psych: euthymic mood, full affect   EKG:  EKG is  Not ordered today.   Lipid Panel    Component Value Date/Time   CHOL 173 12/30/2016 1054   TRIG 72 12/30/2016 1054   HDL 66 12/30/2016 1054   CHOLHDL 2.6 12/30/2016 1054   CHOLHDL 2.2 03/18/2016 1016   VLDL 10 03/18/2016 1016   LDLCALC 93 12/30/2016 1054      Wt Readings from Last 3 Encounters:  03/24/17 73.8 kg (162 lb 12.8 oz)  03/23/17 71.2 kg (157 lb)  01/01/17 72.6 kg (160 lb 0.9 oz)      ASSESSMENT AND PLAN:  1  AV dz  Echo shows mild increase in gradient but still opens well  Follow  2  Dyspnea  Pt with signif COPD  Echo from earlier this spring done  I have reviewed  I thik LVEF is better than reported  Really about 45%  I would follow  3  HL  Pt admits to not taking statin as he should  Misses doses    He says he will try harder  F/U panel in fall    4  HTN  Follow BP    6  Thoracic aneurysm  Followd by [name]    7  COPD  Signif  Felt better on steroids  Moving air OK today  F/U with [name].   Current medicines are reviewed at length with the patient today.  The patient does not have concerns regarding medicines.  Signed, Dorris Carnes, MD  03/24/2017 10:35 AM    [address], [address] [place], [place]  [address] Phone: [phone]; Fax: [phone]

## 2017-03-24 ENCOUNTER — Encounter: Payer: Self-pay | Admitting: Internal Medicine

## 2017-03-24 ENCOUNTER — Ambulatory Visit (INDEPENDENT_AMBULATORY_CARE_PROVIDER_SITE_OTHER): Payer: Medicare Other | Admitting: Internal Medicine

## 2017-03-24 VITALS — BP 132/68 | HR 71 | Ht 66.0 in | Wt 162.8 lb

## 2017-03-24 DIAGNOSIS — I712 Thoracic aortic aneurysm, without rupture, unspecified: Secondary | ICD-10-CM

## 2017-03-24 DIAGNOSIS — I359 Nonrheumatic aortic valve disorder, unspecified: Secondary | ICD-10-CM | POA: Diagnosis not present

## 2017-03-24 DIAGNOSIS — I429 Cardiomyopathy, unspecified: Secondary | ICD-10-CM | POA: Diagnosis not present

## 2017-03-24 DIAGNOSIS — I10 Essential (primary) hypertension: Secondary | ICD-10-CM | POA: Diagnosis not present

## 2017-03-24 DIAGNOSIS — E785 Hyperlipidemia, unspecified: Secondary | ICD-10-CM

## 2017-03-24 NOTE — Patient Instructions (Signed)
Your physician recommends that you continue on your current medications as directed. Please refer to the Current Medication list given to you today.  Your physician recommends that you return for lab work today (BMET, CBC)  Your physician recommends that you schedule a follow-up appointment in: October, 2018 with Dr. Harrington Challenger.

## 2017-03-25 LAB — CBC
Hematocrit: 33.6 % — ABNORMAL LOW (ref 37.5–51.0)
Hemoglobin: 10.4 g/dL — ABNORMAL LOW (ref 13.0–17.7)
MCH: 25 pg — ABNORMAL LOW (ref 26.6–33.0)
MCHC: 31 g/dL — ABNORMAL LOW (ref 31.5–35.7)
MCV: 81 fL (ref 79–97)
Platelets: 206 10*3/uL (ref 150–379)
RBC: 4.16 x10E6/uL (ref 4.14–5.80)
RDW: 18.7 % — ABNORMAL HIGH (ref 12.3–15.4)
WBC: 6.2 10*3/uL (ref 3.4–10.8)

## 2017-03-25 LAB — BASIC METABOLIC PANEL
BUN/Creatinine Ratio: 21 (ref 10–24)
BUN: 19 mg/dL (ref 8–27)
CO2: 25 mmol/L (ref 20–29)
Calcium: 9.3 mg/dL (ref 8.6–10.2)
Chloride: 102 mmol/L (ref 96–106)
Creatinine, Ser: 0.92 mg/dL (ref 0.76–1.27)
GFR calc Af Amer: 98 mL/min/{1.73_m2} (ref >59)
GFR calc non Af Amer: 85 mL/min/{1.73_m2} (ref >59)
Glucose: 82 mg/dL (ref 65–99)
Potassium: 4.2 mmol/L (ref 3.5–5.2)
Sodium: 143 mmol/L (ref 134–144)

## 2017-03-27 ENCOUNTER — Encounter: Payer: Self-pay | Admitting: Thoracic Surgery (Cardiothoracic Vascular Surgery)

## 2017-04-20 ENCOUNTER — Other Ambulatory Visit: Payer: Self-pay | Admitting: Internal Medicine

## 2017-08-04 ENCOUNTER — Encounter: Payer: Self-pay | Admitting: Internal Medicine

## 2017-08-17 ENCOUNTER — Other Ambulatory Visit: Payer: Self-pay | Admitting: Internal Medicine

## 2017-08-18 ENCOUNTER — Encounter: Payer: Self-pay | Admitting: Internal Medicine

## 2017-08-18 ENCOUNTER — Ambulatory Visit (INDEPENDENT_AMBULATORY_CARE_PROVIDER_SITE_OTHER): Payer: Medicare Other | Admitting: Internal Medicine

## 2017-08-18 ENCOUNTER — Encounter (INDEPENDENT_AMBULATORY_CARE_PROVIDER_SITE_OTHER): Payer: Self-pay

## 2017-08-18 VITALS — BP 110/57 | HR 61 | Ht 66.0 in | Wt 165.0 lb

## 2017-08-18 DIAGNOSIS — I10 Essential (primary) hypertension: Secondary | ICD-10-CM | POA: Diagnosis not present

## 2017-08-18 DIAGNOSIS — E785 Hyperlipidemia, unspecified: Secondary | ICD-10-CM | POA: Diagnosis not present

## 2017-08-18 DIAGNOSIS — I2510 Atherosclerotic heart disease of native coronary artery without angina pectoris: Secondary | ICD-10-CM | POA: Diagnosis not present

## 2017-08-18 DIAGNOSIS — I359 Nonrheumatic aortic valve disorder, unspecified: Secondary | ICD-10-CM

## 2017-08-18 LAB — BASIC METABOLIC PANEL
BUN/Creatinine Ratio: 11 (ref 10–24)
BUN: 12 mg/dL (ref 8–27)
CO2: 25 mmol/L (ref 20–29)
Calcium: 8.7 mg/dL (ref 8.6–10.2)
Chloride: 104 mmol/L (ref 96–106)
Creatinine, Ser: 1.14 mg/dL (ref 0.76–1.27)
GFR calc Af Amer: 75 mL/min/{1.73_m2} (ref >59)
GFR calc non Af Amer: 65 mL/min/{1.73_m2} (ref >59)
Glucose: 96 mg/dL (ref 65–99)
Potassium: 4 mmol/L (ref 3.5–5.2)
Sodium: 142 mmol/L (ref 134–144)

## 2017-08-18 LAB — LIPID PANEL
Chol/HDL Ratio: 2.1 ratio (ref 0.0–5.0)
Cholesterol, Total: 143 mg/dL (ref 100–199)
HDL: 69 mg/dL (ref >39)
LDL Calculated: 67 mg/dL (ref 0–99)
Triglycerides: 34 mg/dL (ref 0–149)
VLDL Cholesterol Cal: 7 mg/dL (ref 5–40)

## 2017-08-18 LAB — CBC
Hematocrit: 32.7 % — ABNORMAL LOW (ref 37.5–51.0)
Hemoglobin: 10.2 g/dL — ABNORMAL LOW (ref 13.0–17.7)
MCH: 25.6 pg — ABNORMAL LOW (ref 26.6–33.0)
MCHC: 31.2 g/dL — ABNORMAL LOW (ref 31.5–35.7)
MCV: 82 fL (ref 79–97)
Platelets: 195 10*3/uL (ref 150–379)
RBC: 3.98 x10E6/uL — ABNORMAL LOW (ref 4.14–5.80)
RDW: 16 % — ABNORMAL HIGH (ref 12.3–15.4)
WBC: 5.2 10*3/uL (ref 3.4–10.8)

## 2017-08-18 MED ORDER — ASPIRIN EC 81 MG PO TBEC: 81.0000 mg | DELAYED_RELEASE_TABLET | Freq: Every day | ORAL | 3 refills | Status: DC

## 2017-08-18 NOTE — Patient Instructions (Signed)
Your physician has recommended you make the following change in your medication:  1.) DECREASE aspirin to 81 mg daily (enteric coated)  Your physician recommends that you return for lab work today (LIPIDS, BMET, CBC)  Your physician wants you to follow-up in: [place].  You will receive a reminder letter in the mail two months in advance. If you don't receive a letter, please call our office to schedule the follow-up appointment.

## 2017-08-18 NOTE — Progress Notes (Signed)
Cardiology Office Note   Date:  08/18/2017   ID:  [name] [name], DOB Nov 04, 1945, MRN [id number]  PCP:  Nolene Ebbs, MD  Cardiologist:   Dorris Carnes, MD   F/u of AV dz       History of Present Illness: [name] [name] is a 71 y.o. male with a history of AV dz (s/p AVR in 2007 )  I saw him in March 2018    I recomm echo  AV prosthesis OK  Pumping function mildly decreased    I saw the pt in June 2018  SInce seen he says his breathing is better  Was swtiched to a combivent inhaler  WOking much better  Can get out and walk No CP  Walking    Current Meds  Medication Sig  . albuterol (PROVENTIL HFA;VENTOLIN HFA) 108 (90 Base) MCG/ACT inhaler Inhale 2 puffs into the lungs every 6 (six) hours as needed for wheezing or shortness of breath.  Marland Kitchen albuterol (PROVENTIL) (2.5 MG/3ML) 0.083% nebulizer solution Take 2.5 mg by nebulization every 6 (six) hours as needed for wheezing or shortness of breath.  Marland Kitchen atorvastatin (LIPITOR) 40 MG tablet Take 1 tablet (40 mg total) daily at 6 PM by mouth.  . budesonide-formoterol (SYMBICORT) 160-4.5 MCG/ACT inhaler Inhale 2 puffs into the lungs 2 (two) times daily.  . Cholecalciferol (VITAMIN D PO) Take 1 tablet by mouth daily.  . Cyanocobalamin (VITAMIN B-12 PO) Take 1 tablet by mouth daily.  Marland Kitchen dextromethorphan-guaiFENesin (MUCINEX DM) 30-600 MG per 12 hr tablet Take 1 tablet by mouth every 12 (twelve) hours as needed for cough (mucous build up).   . furosemide (LASIX) 40 MG tablet Take 1 tablet by mouth at bedtime.   . Ipratropium-Albuterol (COMBIVENT RESPIMAT) 20-100 MCG/ACT AERS respimat Inhale 1 puff into the lungs every 6 (six) hours as needed for wheezing or shortness of breath.  . Multiple Vitamin (MULTIVITAMIN WITH MINERALS) TABS tablet Take 1 tablet by mouth daily. Alive  . pantoprazole (PROTONIX) 40 MG tablet Take 1 tablet (40 mg total) by mouth daily.  . potassium chloride (KLOR-CON) 10 MEQ CR tablet Take 10 mEq by mouth at bedtime.   .  pramipexole (MIRAPEX) 0.125 MG tablet Take 0.125 mg by mouth daily as needed (restless legs).   . predniSONE (DELTASONE) 10 MG tablet 40 mg x 2 days, then 30 mg x 2 days, then 20 mg x 2 days, then 10 mg x 2 days and stop  . tiZANidine (ZANAFLEX) 4 MG tablet Take 4 mg 2 (two) times daily as needed by mouth.     Allergies:   Patient has no known allergies.   Past Medical History:  Diagnosis Date  . Aortic stenosis    s/p bioprosthetic valve replacement  . Cardiomyopathy 2006   EF 30%; has history of CHF  . CHF (congestive heart failure) ([place])   . COPD (chronic obstructive pulmonary disease) ([facility])    FEV1/FVC -> 0.52 (69% of predicted)  . Osteoarthritis   . Tobacco abuse     Past Surgical History:  Procedure Laterality Date  . AORTIC VALVE REPLACEMENT     bioprosthetic, 01/2006 - cannot find record of surgery (has scar)  . COLONOSCOPY N/A 01/01/2017   Performed by Laurence Spates, MD at [facility]  . ESOPHAGOGASTRODUODENOSCOPY (EGD) N/A 01/01/2017   Performed by Laurence Spates, MD at [facility]  . TONSILLECTOMY       Social History:  The patient  reports that he quit smoking about 22  months ago. His smoking use included cigarettes. He has a 50.00 pack-year smoking history. he has never used smokeless tobacco. He reports that he does not drink alcohol or use drugs.   Family History:  The patient's family history includes Cancer in his brother and mother; Coronary artery disease in his father; Heart failure in his father.    ROS:  Please see the history of present illness. All other systems are reviewed and  Negative to the above problem except as noted.    PHYSICAL EXAM: VS:  BP (!) 110/57   Pulse 61   Ht 5\' 6"  (1.676 m)   Wt 165 lb (74.8 kg)   SpO2 96%   BMI 26.63 kg/m   GEN: Well nourished, well developed, in no acute distress  HEENT: normal  Neck: no JVD, carotid bruits, or masses Cardiac: RRR; Gr II/VI systolic murmur , rubs, or gallops,  Tr  edema  Respiratory:   Moving airlow GI: soft, nontender, nondistended, + BS  No hepatomegaly  MS: no deformity Moving all extremities   Skin: warm and dry, no rash Neuro:  Strength and sensation are intact Psych: euthymic mood, full affect   EKG:  EKG is  Not ordered today.   Lipid Panel    Component Value Date/Time   CHOL 173 12/30/2016 1054   TRIG 72 12/30/2016 1054   HDL 66 12/30/2016 1054   CHOLHDL 2.6 12/30/2016 1054   CHOLHDL 2.2 03/18/2016 1016   VLDL 10 03/18/2016 1016   LDLCALC 93 12/30/2016 1054      Wt Readings from Last 3 Encounters:  08/18/17 165 lb (74.8 kg)  03/24/17 162 lb 12.8 oz (73.8 kg)  03/23/17 157 lb (71.2 kg)      ASSESSMENT AND PLAN:  1  AV dz  Echo shows mild increase in gradient but still opens well  Follow up echo probably next fall  2  Dyspnea /COPD  Imprved on new inhaler    3  Mild LV dysfunction  Will keep on same meds  Check electrolytes  Echo next year    4  HL  Back on statin  WIll get lipids  5  CAD  Asymptomatic  Taking ASA  Will switch to 81  6  Anemia  Check CBC   78  HTN  Follow BP    8   Thoracic aneurysm  Followd by [name]    I will f/u in 6 months   Signed, Dorris Carnes, MD  08/18/2017 10:31 AM    [facility] [address], [place], [place]  [address] Phone: [phone]; Fax: [phone]

## 2017-09-01 ENCOUNTER — Other Ambulatory Visit: Payer: Self-pay | Admitting: Thoracic Surgery (Cardiothoracic Vascular Surgery)

## 2017-09-01 DIAGNOSIS — I712 Thoracic aortic aneurysm, without rupture, unspecified: Secondary | ICD-10-CM

## 2017-09-12 ENCOUNTER — Ambulatory Visit: Payer: Medicare Other | Admitting: Thoracic Surgery (Cardiothoracic Vascular Surgery)

## 2017-09-12 ENCOUNTER — Inpatient Hospital Stay: Admission: RE | Admit: 2017-09-12 | Payer: Medicare Other | Source: Ambulatory Visit

## 2017-09-21 ENCOUNTER — Ambulatory Visit: Payer: Medicare Other | Admitting: Thoracic Surgery (Cardiothoracic Vascular Surgery)

## 2017-09-22 ENCOUNTER — Ambulatory Visit
Admission: RE | Admit: 2017-09-22 | Discharge: 2017-09-22 | Disposition: A | Discharge status: Home / Self Care | Payer: Medicare Other | Source: Ambulatory Visit | Attending: Thoracic Surgery (Cardiothoracic Vascular Surgery) | Admitting: Thoracic Surgery (Cardiothoracic Vascular Surgery)

## 2017-09-22 DIAGNOSIS — I712 Thoracic aortic aneurysm, without rupture, unspecified: Secondary | ICD-10-CM

## 2017-09-22 MED ORDER — IOPAMIDOL (ISOVUE-370) INJECTION 76%
75.0000 mL | Freq: Once | INTRAVENOUS | Status: AC | PRN
  Administered 2017-09-22: 75 mL via INTRAVENOUS

## 2017-10-02 ENCOUNTER — Ambulatory Visit: Payer: Medicare Other | Admitting: Thoracic Surgery (Cardiothoracic Vascular Surgery)

## 2017-10-10 ENCOUNTER — Other Ambulatory Visit: Payer: Self-pay

## 2017-10-10 ENCOUNTER — Ambulatory Visit (INDEPENDENT_AMBULATORY_CARE_PROVIDER_SITE_OTHER): Payer: Medicare Other | Admitting: Thoracic Surgery (Cardiothoracic Vascular Surgery)

## 2017-10-10 ENCOUNTER — Encounter: Payer: Self-pay | Admitting: Thoracic Surgery (Cardiothoracic Vascular Surgery)

## 2017-10-10 VITALS — BP 154/83 | HR 84 | Resp 20 | Ht 66.0 in | Wt 171.6 lb

## 2017-10-10 DIAGNOSIS — I712 Thoracic aortic aneurysm, without rupture, unspecified: Secondary | ICD-10-CM

## 2017-10-10 MED ORDER — LISINOPRIL 10 MG PO TABS: 10.0000 mg | ORAL_TABLET | Freq: Every day | ORAL | 1 refills | Status: DC

## 2017-10-10 NOTE — Patient Instructions (Addendum)
How to Take Your Blood Pressure  Blood pressure is a measurement of how strongly your blood is pressing against the walls of your arteries. Arteries are blood vessels that carry blood from your heart throughout your body. Your health care provider takes your blood pressure at each office visit. You can also take your own blood pressure at home with a blood pressure machine. You may need to take your own blood pressure:   To confirm a diagnosis of high blood pressure (hypertension).   To monitor your blood pressure over time.   To make sure your blood pressure medicine is working.    Supplies needed:  To take your blood pressure, you will need a blood pressure machine. You can buy a blood pressure machine, or blood pressure monitor, at most drugstores or online. There are several types of home blood pressure monitors. When choosing one, consider the following:   Choose a monitor that has an arm cuff.   Choose a monitor that wraps snugly around your upper arm. You should be able to fit only one finger between your arm and the cuff.   Do not choose a monitor that measures your blood pressure from your wrist or finger.    Your health care provider can suggest a reliable monitor that will meet your needs.  How to prepare  To get the most accurate reading, avoid the following for 30 minutes before you check your blood pressure:   Drinking caffeine.   Drinking alcohol.   Eating.   Smoking.   Exercising.    Five minutes before you check your blood pressure:   Empty your bladder.   Sit quietly without talking in a dining chair, rather than in a soft couch or armchair.    How to take your blood pressure  To check your blood pressure, follow the instructions in the manual that came with your blood pressure monitor. If you have a digital blood pressure monitor, the instructions may be as follows:  1. Sit up straight.  2. Place your feet on the floor. Do not cross your ankles or legs.  3. Rest your left arm at the  level of your heart on a table or desk or on the arm of a chair.  4. Pull up your shirt sleeve.  5. Wrap the blood pressure cuff around the upper part of your left arm, 1 inch (2.5 cm) above your elbow. It is best to wrap the cuff around bare skin.  6. Fit the cuff snugly around your arm. You should be able to place only one finger between the cuff and your arm.  7. Position the cord inside the groove of your elbow.  8. Press the power button.  9. Sit quietly while the cuff inflates and deflates.  10. Read the digital reading on the monitor screen and write it down (record it).  11. Wait 2-3 minutes, then repeat the steps, starting at step 1.    What does my blood pressure reading mean?  A blood pressure reading consists of a higher number over a lower number. Ideally, your blood pressure should be below 120/80. The first ("top") number is called the systolic pressure. It is a measure of the pressure in your arteries as your heart beats. The second ("bottom") number is called the diastolic pressure. It is a measure of the pressure in your arteries as the heart relaxes.  Blood pressure is classified into four stages. The following are the stages for adults who do   not have a short-term serious illness or a chronic condition. Systolic pressure and diastolic pressure are measured in a unit called mm Hg.  Normal   Systolic pressure: below 120.   Diastolic pressure: below 80.  Elevated   Systolic pressure: 120-129.   Diastolic pressure: below 80.  Hypertension stage 1   Systolic pressure: 130-139.   Diastolic pressure: 80-89.  Hypertension stage 2   Systolic pressure: 140 or above.   Diastolic pressure: 90 or above.  You can have prehypertension or hypertension even if only the systolic or only the diastolic number in your reading is higher than normal.  Follow these instructions at home:   Check your blood pressure as often as recommended by your health care provider.   Take your monitor to the next appointment  with your health care provider to make sure:  ? That you are using it correctly.  ? That it provides accurate readings.   Be sure you understand what your goal blood pressure numbers are.   Tell your health care provider if you are having any side effects from blood pressure medicine.  Contact a health care provider if:   Your blood pressure is consistently high.  Get help right away if:   Your systolic blood pressure is higher than 180.   Your diastolic blood pressure is higher than 110.  This information is not intended to replace advice given to you by your health care provider. Make sure you discuss any questions you have with your health care provider.  Document Released: 02/26/2016 Document Revised: 05/10/2016 Document Reviewed: 02/26/2016  Elsevier Interactive Patient Education  2018 [employer]vier Inc[employer]

## 2017-10-10 NOTE — Progress Notes (Signed)
[address]       [place] [address]             [phone]     HPI: Mr. Reiling returns for follow-up  Mr. Rueth is a 72 year old gentleman with a past medical history significant for bicuspid aortic valve, aortic stenosis, status post AVR with bioprosthetic valve in 2007, ascending aortic aneurysm, tobacco abuse, COPD, arthritis, hypertension, back pain, gastrointestinal bleed, and restless leg syndrome.  He had an echocardiogram back in 2016 which showed some dilatation of the aortic root.  He has been followed with CT scans and echocardiograms since that time.  I saw him most recently in June 2018.  His aortic root was 5.2 cm and is ascending aorta was 4.5 cm.  He was asymptomatic.  His blood pressure was elevated at that visit.  In the interim since his last visit he has been feeling well.  He is breathing varies from day-to-day.  He says that when the weather is good he does not really have any problems at all, but if it is hot and humid or cold and raining he feels very short of breath.  He did purchase a blood pressure cuff and says his blood pressures have been in the 140-150 range.  He does not have any chest pain, pressure, or tightness.  Past Medical History:  Diagnosis Date  . Aortic stenosis    s/p bioprosthetic valve replacement  . Cardiomyopathy 2006   EF 30%; has history of CHF  . CHF (congestive heart failure) ([place])   . COPD (chronic obstructive pulmonary disease) ([facility])    FEV1/FVC -> 0.52 (69% of predicted)  . Osteoarthritis   . Tobacco abuse     Current Outpatient Medications  Medication Sig Dispense Refill  . albuterol (PROVENTIL HFA;VENTOLIN HFA) 108 (90 Base) MCG/ACT inhaler Inhale 2 puffs into the lungs every 6 (six) hours as needed for wheezing or shortness of breath.    Marland Kitchen albuterol (PROVENTIL) (2.5 MG/3ML) 0.083% nebulizer solution Take 2.5 mg by nebulization every 6 (six) hours as needed for wheezing or shortness of breath.    Marland Kitchen  aspirin EC 81 MG tablet Take 1 tablet (81 mg total) daily by mouth. 90 tablet 3  . atorvastatin (LIPITOR) 40 MG tablet Take 1 tablet (40 mg total) daily at 6 PM by mouth. 90 tablet 2  . budesonide-formoterol (SYMBICORT) 160-4.5 MCG/ACT inhaler Inhale 2 puffs into the lungs 2 (two) times daily.    . Cholecalciferol (VITAMIN D PO) Take 1 tablet by mouth daily.    . Cyanocobalamin (VITAMIN B-12 PO) Take 1 tablet by mouth daily.    Marland Kitchen dextromethorphan-guaiFENesin (MUCINEX DM) 30-600 MG per 12 hr tablet Take 1 tablet by mouth every 12 (twelve) hours as needed for cough (mucous build up).     . furosemide (LASIX) 40 MG tablet Take 1 tablet by mouth at bedtime.     . Ipratropium-Albuterol (COMBIVENT RESPIMAT) 20-100 MCG/ACT AERS respimat Inhale 1 puff into the lungs every 6 (six) hours as needed for wheezing or shortness of breath.    . Multiple Vitamin (MULTIVITAMIN WITH MINERALS) TABS tablet Take 1 tablet by mouth daily. Alive    . pantoprazole (PROTONIX) 40 MG tablet Take 1 tablet (40 mg total) by mouth daily. 30 tablet 0  . potassium chloride (KLOR-CON) 10 MEQ CR tablet Take 10 mEq by mouth at bedtime.     . pramipexole (MIRAPEX) 0.125 MG tablet Take 0.125 mg by mouth  daily as needed (restless legs).     . predniSONE (DELTASONE) 10 MG tablet 40 mg x 2 days, then 30 mg x 2 days, then 20 mg x 2 days, then 10 mg x 2 days and stop 20 tablet 0  . tiZANidine (ZANAFLEX) 4 MG tablet Take 4 mg 2 (two) times daily as needed by mouth.  2  . lisinopril (PRINIVIL,ZESTRIL) 10 MG tablet Take 1 tablet (10 mg total) by mouth daily. 30 tablet 1   No current facility-administered medications for this visit.     Physical Exam BP (!) 154/83 (BP Location: Left Arm, Patient Position: Sitting, Cuff Size: Large)   Pulse 84   Resp 20   Ht 5\' 6"  (1.676 m)   Wt 171 lb 9.6 oz (77.8 kg)   SpO2 94% Comment: RA  BMI 27.9 kg/m  72 year old man in no acute distress Alert and oriented x3 with no focal deficits Cardiac  regular rate and rhythm with a 2/6 systolic murmur Lungs clear with equal breath sounds bilaterally No clubbing cyanosis or edema  Diagnostic Tests: CT ANGIOGRAPHY CHEST WITH CONTRAST  TECHNIQUE: Multidetector CT imaging of the chest was performed using the standard protocol during bolus administration of intravenous contrast. Multiplanar CT image reconstructions and MIPs were obtained to evaluate the vascular anatomy.  CONTRAST:  48mL ISOVUE-370 IOPAMIDOL (ISOVUE-370) INJECTION 76%  COMPARISON:  03/03/2017  FINDINGS: Cardiovascular: Heart is normal size. There is calcified plaque over the coronary arteries. Evidence of patient's aortic valve replacement. Mild calcified plaque over the thoracic aorta. There is dilatation of the aortic root which is unchanged and measures 5.2 cm diameter. Ascending thoracic aorta is unchanged measuring 4.3 cm in diameter. Mid segment of the aortic arch is unchanged measuring 2.8 cm and proximal descending thoracic aorta not significantly changed measuring 3.2 cm in diameter. Normal 3 vessel takeoff from the aortic arch. Moderate calcified plaque at the takeoff of the left subclavian artery. Arterial system is unremarkable.  Mediastinum/Nodes: No evidence of mediastinal or hilar adenopathy. Remaining mediastinal structures are within normal.  Lungs/Pleura: Lungs are well inflated without focal consolidation or effusion. Mild centrilobular emphysematous disease is present. Airways are normal.  Upper Abdomen: Mild calcified plaque over the abdominal aorta. Remainder of the upper abdomen is unchanged.  Musculoskeletal: Mild degenerate change of the spine. Median sternotomy wires are present  Review of the MIP images confirms the above findings.  IMPRESSION: No acute cardiopulmonary disease.  Stable thoracic aortic aneurysm measuring 5.2 cm in diameter at the aortic root and 4.3 cm over the mid-ascending thoracic aorta. Recommend  semi-annual imaging followup by CTA or MRA and referral to cardiothoracic surgery if not already obtained. This recommendation follows 2010 ACCF/AHA/AATS/ACR/ASA/SCA/SCAI/SIR/STS/SVM Guidelines for the Diagnosis and Management of Patients With Thoracic Aortic Disease. Circulation. 2010; 121: W967-R916.  Atherosclerotic coronary artery disease.  Aortic Atherosclerosis (ICD10-I70.0) and Emphysema (ICD10-J43.9).   Electronically Signed   By: Marin Olp M.D.   On: 09/22/2017 13:57  Impression: 72 year old man with a history of a bicuspid aortic valve and aortic valve replacement for critical aortic stenosis with a bioprosthetic valve in 2007.  He has a dilated aortic root and mildly aneurysmal ascending aorta.  He is asymptomatic and his aortic size has not changed significantly over the past 6 months.  Hypertension-blood pressure remains elevated.  From talking to him his blood pressure is consistently above 140.  He is not a good candidate for beta-blocker given his significant COPD.  I am going to start him on lisinopril  10 mg daily.  Plan: Lisinopril 10 mg p.o. daily  Return in 6 months with CT angiogram  Melrose Nakayama, MD [facility] [phone]  Note I obtained his operative report from 01/18/2006.  He had critical aortic stenosis with congestive heart failure and valvular cardiomyopathy as well as single-vessel coronary disease.  He had aortic valve replacement with a 25 mm Edwards magna bioprosthetic valve and coronary bypass grafting x1 with a saphenous vein to his distal right coronary.  He did have a bicuspid aortic valve.  His aortic root was approximately 4 cm at that time.  Revonda Standard Roxan Hockey, MD [facility] [phone]

## 2017-12-06 ENCOUNTER — Other Ambulatory Visit: Payer: Self-pay | Admitting: Thoracic Surgery (Cardiothoracic Vascular Surgery)

## 2017-12-21 ENCOUNTER — Other Ambulatory Visit: Payer: Self-pay | Admitting: Thoracic Surgery (Cardiothoracic Vascular Surgery)

## 2018-01-10 ENCOUNTER — Other Ambulatory Visit: Payer: Self-pay | Admitting: Thoracic Surgery (Cardiothoracic Vascular Surgery)

## 2018-02-14 ENCOUNTER — Encounter: Payer: Self-pay | Admitting: Physician Assistant

## 2018-02-14 NOTE — Progress Notes (Signed)
Cardiology Office Note    Date:  02/16/2018   ID:  [name] [name], DOB 06-Oct-1945, MRN [id number]  PCP:  Nolene Ebbs, MD  Cardiologist:  Dr. Harrington Challenger  Chief Complaint: 6 Months follow up  History of Present Illness:   [name] [name] is a 72 y.o. male with hx of aortic valve disorder s/p bioprosthetic AVR in 2007, CAD s/p CABG (SVG to [facility]) ascending aortic aneurysm, chronic systolic CHF, HLD, COPD and tobacco abuse presents for follow up.  Last echo 01/2017 showed LVEF of 35-40%, grade 1 DD, bioprosthetic aortic valve mean gradient has increased from 17 mmHg to 24 mmHg. Ascending aorta diameter is unchanged.  Last seen by Dr. Harrington Challenger 08/2017. CT angiogram 09/2017 showed stable thoracic aortic aneurysm measuring 5.2 cm.   Patient is here for six-month follow-up.  He denies any chest pain.  He has chronic shortness of breath due to COPD.  Stable.  Noted intermittent exacerbation if he eats excess salt.  Denies orthopnea, PND, syncope, lower extremity edema, melena or blood in his stool or urine.  Compliant with medication.   Past Medical History:  Diagnosis Date  . Aortic stenosis    s/p bioprosthetic valve replacement  . Ascending aortic aneurysm ([place])   . CAD (coronary artery disease) of bypass graft    s/p SVG to dRCA  . Cardiomyopathy 2006   EF 30%; has history of CHF  . CHF (congestive heart failure) ([place])   . COPD (chronic obstructive pulmonary disease) ([facility])    FEV1/FVC -> 0.52 (69% of predicted)  . Osteoarthritis   . Tobacco abuse     Past Surgical History:  Procedure Laterality Date  . AORTIC VALVE REPLACEMENT     bioprosthetic, 01/2006 - cannot find record of surgery (has scar)  . COLONOSCOPY N/A 01/01/2017   Procedure: COLONOSCOPY;  Surgeon: Laurence Spates, MD;  [facility];  Service: Endoscopy;  Laterality: N/A;  . ESOPHAGOGASTRODUODENOSCOPY N/A 01/01/2017   Procedure: ESOPHAGOGASTRODUODENOSCOPY (EGD);  Surgeon: Laurence Spates, MD;  Location: [facility] ENDOSCOPY;   Service: Endoscopy;  Laterality: N/A;  . TONSILLECTOMY      Current Medications: Prior to Admission medications   Medication Sig Start Date End Date Taking? Authorizing Provider  albuterol (PROVENTIL HFA;VENTOLIN HFA) 108 (90 Base) MCG/ACT inhaler Inhale 2 puffs into the lungs every 6 (six) hours as needed for wheezing or shortness of breath.    [provider]  albuterol (PROVENTIL) (2.5 MG/3ML) 0.083% nebulizer solution Take 2.5 mg by nebulization every 6 (six) hours as needed for wheezing or shortness of breath.    [provider]  aspirin EC 81 MG tablet Take 1 tablet (81 mg total) daily by mouth. 08/18/17   Fay Records, MD  atorvastatin (LIPITOR) 40 MG tablet Take 1 tablet (40 mg total) daily at 6 PM by mouth. 08/18/17   Fay Records, MD  budesonide-formoterol [facility]) 160-4.5 MCG/ACT inhaler Inhale 2 puffs into the lungs 2 (two) times daily.    [provider]  Cholecalciferol (VITAMIN D PO) Take 1 tablet by mouth daily.    [provider]  Cyanocobalamin (VITAMIN B-12 PO) Take 1 tablet by mouth daily.    [provider]  dextromethorphan-guaiFENesin (MUCINEX DM) 30-600 MG per 12 hr tablet Take 1 tablet by mouth every 12 (twelve) hours as needed for cough (mucous build up).     [provider]  furosemide (LASIX) 40 MG tablet Take 1 tablet by mouth at bedtime.  11/04/16   [provider]  Ipratropium-Albuterol (COMBIVENT RESPIMAT) 20-100 MCG/ACT AERS respimat Inhale 1 puff into the lungs every 6 (six) hours as needed for wheezing or shortness of breath.    [provider]  lisinopril (PRINIVIL,ZESTRIL) 10 MG tablet TAKE 1 TABLET(10 MG) BY MOUTH DAILY 12/07/17   Melrose Nakayama, MD  Multiple Vitamin (MULTIVITAMIN WITH MINERALS) TABS tablet Take 1 tablet by mouth daily. Alive    [provider]  pantoprazole (PROTONIX) 40 MG tablet Take 1 tablet (40 mg total) by mouth daily. 01/01/17   Geradine Girt, DO   potassium chloride (KLOR-CON) 10 MEQ CR tablet Take 10 mEq by mouth at bedtime.     [provider]  pramipexole (MIRAPEX) 0.125 MG tablet Take 0.125 mg by mouth daily as needed (restless legs).  12/28/16   [provider]  predniSONE (DELTASONE) 10 MG tablet 40 mg x 2 days, then 30 mg x 2 days, then 20 mg x 2 days, then 10 mg x 2 days and stop 01/01/17   Eulogio Bear U, DO  tiZANidine (ZANAFLEX) 4 MG tablet Take 4 mg 2 (two) times daily as needed by mouth. 07/31/17   [provider]    Allergies:   Patient has no known allergies.   Social History   Socioeconomic History  . Marital status: Single    Spouse name: Not on file  . Number of children: 0  . Years of education: Not on file  . Highest education level: Not on file  Occupational History  . Occupation: Retired    Comment: Manufacturing systems engineer  . Financial resource strain: Not on file  . Food insecurity:    Worry: Not on file    Inability: Not on file  . Transportation needs:    Medical: Not on file    Non-medical: Not on file  Tobacco Use  . Smoking status: Former Smoker    Packs/day: 1.00    Years: 50.00    Pack years: 50.00    Types: Cigarettes    Last attempt to quit: 10/04/2015    Years since quitting: 2.3  . Smokeless tobacco: Never Used  Substance and Sexual Activity  . Alcohol use: No    Comment: states he quit many years ago  . 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 Sexual activity: Not on file  Lifestyle  . Physical activity:    Days per week: Not on file    Minutes per session: Not on file  . Stress: Not on file  Relationships  . Social connections:    Talks on phone: Not on file    Gets together: Not on file    Attends religious service: Not on file    Active member of club or organization: Not on file    Attends meetings of clubs or organizations: Not on file    Relationship status: Not on file  Other Topics Concern  . Not on file   Social History Narrative  . Not on file     Family History:  The patient's family history includes Cancer in his brother and mother; Coronary artery disease in his father; Heart failure in his father.   ROS:   Please see the history of present illness.    ROS All other systems reviewed and are negative.   PHYSICAL EXAM:   VS:  BP 132/80   Pulse 61   Ht 5\' 6"  (1.676 m)   Wt 163  lb (73.9 kg)   SpO2 97%   BMI 26.31 kg/m    GEN: Well nourished, well developed, in no acute distress  HEENT: normal  Neck: no carotid bruits, or masses.  JVD difficult to assess due to beard Cardiac: RRR; no murmurs, rubs, or gallops,no edema  Respiratory:  clear to auscultation bilaterally, normal work of breathing GI: soft, nontender, nondistended, + BS MS: no deformity or atrophy  Skin: warm and dry, no rash Neuro:  Alert and Oriented x 3, Strength and sensation are intact Psych: euthymic mood, full affect  Wt Readings from Last 3 Encounters:  02/16/18 163 lb (73.9 kg)  10/10/17 171 lb 9.6 oz (77.8 kg)  08/18/17 165 lb (74.8 kg)      Studies/Labs Reviewed:   EKG:  EKG is not ordered today.    Recent Labs: 08/18/2017: BUN 12; Creatinine, Ser 1.14; Hemoglobin 10.2; Platelets 195; Potassium 4.0; Sodium 142   Lipid Panel    Component Value Date/Time   CHOL 143 08/18/2017 1057   TRIG 34 08/18/2017 1057   HDL 69 08/18/2017 1057   CHOLHDL 2.1 08/18/2017 1057   CHOLHDL 2.2 03/18/2016 1016   VLDL 10 03/18/2016 1016   LDLCALC 67 08/18/2017 1057    Additional studies/ records that were reviewed today include:   Echocardiogram: 01/17/17 Study Conclusions  - Left ventricle: The cavity size was normal. There was moderate   concentric hypertrophy. Systolic function was moderately reduced.   The estimated ejection fraction was in the range of 35% to 40%.   Diffuse hypokinesis. Doppler parameters are consistent with   abnormal left ventricular relaxation (grade 1 diastolic   dysfunction).  Doppler parameters are consistent with   indeterminate ventricular filling pressure. - Aortic valve: A bioprosthesis was present. There was mild   stenosis. There was no regurgitation. Peak velocity (S): 303   cm/s. Mean gradient (S): 24 mm Hg. - Aorta: Ascending aortic diameter: 46 mm (S). - Ascending aorta: The ascending aorta was moderately dilated. - Mitral valve: Transvalvular velocity was within the normal range.   There was no evidence for stenosis. There was mild regurgitation. - Right ventricle: The cavity size was normal. Wall thickness was   normal. Systolic function was normal. - Atrial septum: No defect or patent foramen ovale was identified   by color flow Doppler. - Tricuspid valve: There was mild regurgitation. - Pulmonary arteries: Systolic pressure was within the normal   range. PA peak pressure: 23 mm Hg (S).  Impressions:  - Since the echo 01/02/15, bioprosthetic aortic valve mean gradient   has increased from 17 mmHg to 24 mmHg. Ascending aorta diameter   is unchanged.  CT ANGIOGRAPHY CHEST WITH CONTRAST 09/2017 IMPRESSION: No acute cardiopulmonary disease.  Stable thoracic aortic aneurysm measuring 5.2 cm in diameter at the aortic root and 4.3 cm over the mid-ascending thoracic aorta. Recommend semi-annual imaging followup by CTA or MRA and referral to cardiothoracic surgery if not already obtained. This recommendation follows 2010 ACCF/AHA/AATS/ACR/ASA/SCA/SCAI/SIR/STS/SVM Guidelines for the Diagnosis and Management of Patients With Thoracic Aortic Disease. Circulation. 2010; 121: Y606-T016.  Atherosclerotic coronary artery disease.  Aortic Atherosclerosis (ICD10-I70.0) and Emphysema (ICD10-J43.9).  ASSESSMENT & PLAN:    1. S/p AVR - Last echo 01/2017 showed LVEF of 35-40%, grade 1 DD, bioprosthetic aortic valve mean gradient has increased from 17 mmHg to 24 mmHg. Ascending aorta diameter is unchanged. -His dyspnea is stable.  No signs of acute CHF  exacerbation.  Will continue current medication.  Consider repeat echocardiogram during  follow-up.  2. thoracic aortic aneurysm  - Followed by Dr. Latanya Maudlin  3.  Chronic systolic heart failure -Intermittent exacerbation of shortness of breath when he eats excess salt.  He will work on diet and exercise.  Continue current regimen.  4.  CAD status post CABG -No angina.  Continue aspirin and Lipitor.   5.  Hyperlipidemia -Continue statin.  LDL at 67.  Medication Adjustments/Labs and Tests Ordered: Current medicines are reviewed at length with the patient today.  Concerns regarding medicines are outlined above.  Medication changes, Labs and Tests ordered today are listed in the Patient Instructions below. Patient Instructions  Your physician recommends that you continue on your current medications as directed. Please refer to the Current Medication list given to you today.   Your physician wants you to follow-up in:  [place] will receive a reminder letter in the mail two months in advance. If you don't receive a letter, please call our office to schedule the follow-up appointment.     Jarrett Soho, [place]  02/16/2018 10:13 AM    [address], [place], [place]  [address] Phone: [phone]; Fax: [phone]

## 2018-02-16 ENCOUNTER — Ambulatory Visit (INDEPENDENT_AMBULATORY_CARE_PROVIDER_SITE_OTHER): Payer: Medicare Other | Admitting: Physician Assistant

## 2018-02-16 VITALS — BP 132/80 | HR 61 | Ht 66.0 in | Wt 163.0 lb

## 2018-02-16 DIAGNOSIS — I712 Thoracic aortic aneurysm, without rupture, unspecified: Secondary | ICD-10-CM

## 2018-02-16 DIAGNOSIS — I10 Essential (primary) hypertension: Secondary | ICD-10-CM | POA: Diagnosis not present

## 2018-02-16 DIAGNOSIS — Z952 Presence of prosthetic heart valve: Secondary | ICD-10-CM

## 2018-02-16 DIAGNOSIS — E782 Mixed hyperlipidemia: Secondary | ICD-10-CM | POA: Diagnosis not present

## 2018-02-16 DIAGNOSIS — I2510 Atherosclerotic heart disease of native coronary artery without angina pectoris: Secondary | ICD-10-CM | POA: Diagnosis not present

## 2018-02-16 DIAGNOSIS — I5022 Chronic systolic (congestive) heart failure: Secondary | ICD-10-CM | POA: Diagnosis not present

## 2018-02-16 DIAGNOSIS — I359 Nonrheumatic aortic valve disorder, unspecified: Secondary | ICD-10-CM

## 2018-02-16 NOTE — Patient Instructions (Signed)
Your physician recommends that you continue on your current medications as directed. Please refer to the Current Medication list given to you today.  Your physician wants you to follow-up in: 6 MONTHS WITH DR. ROSS.   You will receive a reminder letter in the mail two months in advance. If you don't receive a letter, please call our office to schedule the follow-up appointment.  

## 2018-03-15 ENCOUNTER — Other Ambulatory Visit: Payer: Self-pay | Admitting: Internal Medicine

## 2018-03-19 ENCOUNTER — Other Ambulatory Visit: Payer: Self-pay | Admitting: Thoracic Surgery (Cardiothoracic Vascular Surgery)

## 2018-03-19 DIAGNOSIS — I712 Thoracic aortic aneurysm, without rupture, unspecified: Secondary | ICD-10-CM

## 2018-04-06 ENCOUNTER — Other Ambulatory Visit: Payer: Medicare Other

## 2018-04-13 ENCOUNTER — Ambulatory Visit
Admission: RE | Admit: 2018-04-13 | Discharge: 2018-04-13 | Disposition: A | Discharge status: Home / Self Care | Payer: Medicare Other | Source: Ambulatory Visit | Attending: Thoracic Surgery (Cardiothoracic Vascular Surgery) | Admitting: Thoracic Surgery (Cardiothoracic Vascular Surgery)

## 2018-04-13 DIAGNOSIS — I712 Thoracic aortic aneurysm, without rupture, unspecified: Secondary | ICD-10-CM

## 2018-04-13 MED ORDER — IOPAMIDOL (ISOVUE-370) INJECTION 76%
75.0000 mL | Freq: Once | INTRAVENOUS | Status: AC | PRN
  Administered 2018-04-13: 75 mL via INTRAVENOUS

## 2018-04-24 ENCOUNTER — Ambulatory Visit (INDEPENDENT_AMBULATORY_CARE_PROVIDER_SITE_OTHER): Payer: Medicare Other | Admitting: Thoracic Surgery (Cardiothoracic Vascular Surgery)

## 2018-04-24 ENCOUNTER — Encounter: Payer: Self-pay | Admitting: Thoracic Surgery (Cardiothoracic Vascular Surgery)

## 2018-04-24 VITALS — BP 112/72 | HR 78 | Resp 20 | Ht 66.0 in | Wt 168.0 lb

## 2018-04-24 DIAGNOSIS — I712 Thoracic aortic aneurysm, without rupture, unspecified: Secondary | ICD-10-CM

## 2018-04-24 NOTE — Progress Notes (Signed)
[address]       [place] [address]             [phone]     HPI: [name] [name] returns for follow-up of his aortic root and ascending aneurysm.  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  He had an aortic valve replacement back in 2007.  He was noted to have a dilated aortic root on echocardiogram in 2016.  CT of his chest showed a 5.2 cm aortic root and a 4.3 cm ascending aorta.  Does have a history of tobacco abuse.  He smoked heavily until 2017.  He has not smoked since.  He has baseline dyspnea with exertion due to COPD.  That has not changed recently.  He denies any chest pain, pressure, or tightness.  He denies peripheral edema.  He had some issues with low blood pressure recently.  His lisinopril has been decreased to 5 mg daily.  He has not had any problems since that change was made.  Past Medical History:  Diagnosis Date  . Aortic stenosis    s/p bioprosthetic valve replacement  . Ascending aortic aneurysm ([place])   . CAD (coronary artery disease) of bypass graft    s/p SVG to dRCA  . Cardiomyopathy 2006   EF 30%; has history of CHF  . CHF (congestive heart failure) ([place])   . COPD (chronic obstructive pulmonary disease) ([facility])    FEV1/FVC -> 0.52 (69% of predicted)  . Osteoarthritis   . Tobacco abuse     Current Outpatient Medications  Medication Sig Dispense Refill  . albuterol (PROVENTIL HFA;VENTOLIN HFA) 108 (90 Base) MCG/ACT inhaler Inhale 2 puffs into the lungs every 6 (six) hours as needed for wheezing or shortness of breath.    Marland Kitchen albuterol (PROVENTIL) (2.5 MG/3ML) 0.083% nebulizer solution Take 2.5 mg by nebulization every 6 (six) hours as needed for wheezing or shortness of breath.     Marland Kitchen aspirin EC 81 MG tablet Take 1 tablet (81 mg total) daily by mouth. 90 tablet 3  . atorvastatin (LIPITOR) 40 MG tablet TAKE 1 TABLET(40 MG) BY MOUTH DAILY AT 6 PM 90 tablet 3  . budesonide-formoterol (SYMBICORT) 160-4.5 MCG/ACT inhaler Inhale 2 puffs into the lungs 2 (two) times daily.    . Cholecalciferol (VITAMIN D PO) Take 1 tablet by mouth daily.    . Cyanocobalamin (VITAMIN B-12 PO) Take 1 tablet by mouth daily.    Marland Kitchen dextromethorphan-guaiFENesin (MUCINEX DM) 30-600 MG per 12 hr tablet Take 1 tablet by mouth every 12 (twelve) hours as needed for cough (mucous build up).     . furosemide (LASIX) 40 MG tablet Take 1 tablet by mouth at bedtime.     . Ipratropium-Albuterol (COMBIVENT RESPIMAT) 20-100 MCG/ACT AERS respimat Inhale 1 puff into the lungs every 6 (six) hours as needed for wheezing or shortness of breath.    . lisinopril (PRINIVIL,ZESTRIL) 10 MG tablet TAKE 1 TABLET(10 MG) BY MOUTH DAILY 30 tablet 0  . Multiple Vitamin (MULTIVITAMIN WITH MINERALS) TABS tablet Take 1 tablet by mouth daily. Alive    . pantoprazole (PROTONIX) 40 MG tablet Take 1 tablet (40 mg total) by mouth daily. 30 tablet 0  . potassium chloride (KLOR-CON) 10 MEQ CR tablet Take 10 mEq  by mouth at bedtime.     . pramipexole (MIRAPEX) 0.125 MG tablet Take 0.125 mg by mouth daily as needed (restless legs).     . predniSONE (DELTASONE) 10 MG tablet 40 mg x 2 days, then 30 mg x 2 days, then 20 mg x 2 days, then 10 mg x 2 days and stop 20 tablet 0  . tiZANidine (ZANAFLEX) 4 MG tablet Take 4 mg 2 (two) times daily as needed by mouth.  2   No current facility-administered medications for this visit.     Physical Exam BP 112/72   Pulse 78   Resp 20   Ht 5\' 6"  (1.676 m)   Wt 168 lb (76.2 kg)   SpO2 95% Comment: RA  BMI 27.62 kg/m  72 year old man in no acute distress.  Appears older than stated age Alert and oriented x3 with no focal deficits Cardiac regular rate and rhythm normal S1 and S2 no murmur Lungs  diminished breath sounds bilaterally, no rales or wheezing No peripheral edema  Diagnostic Tests: CT ANGIOGRAPHY CHEST WITH CONTRAST  TECHNIQUE: Multidetector CT imaging of the chest was performed using the standard protocol during bolus administration of intravenous contrast. Multiplanar CT image reconstructions and MIPs were obtained to evaluate the vascular anatomy.  CONTRAST:  50mL ISOVUE-370 IOPAMIDOL (ISOVUE-370) INJECTION 76%  COMPARISON:  09/22/2017  FINDINGS: Cardiovascular: The heart size is normal. No substantial pericardial effusion. Coronary artery calcification is evident. Status post CABG. Ascending thoracic aortic aneurysm again noted. Measuring on coronal reconstructions, maximum obtainable diameter at the level of the sinuses of Valsalva is 5.1 cm today which compares to 5.2 cm previously. Coronal imaging at the level of the sino-tubular junction is 4.3 cm maximum diameter. Assessment of ascending thoracic aorta in its mid segment on axial imaging reveals maximum diameter 4.1 cm today which compares to 4.3 cm on the prior study.  Mediastinum/Nodes: No mediastinal lymphadenopathy. There is no hilar lymphadenopathy. The esophagus has normal imaging features. There is no axillary lymphadenopathy.  Lungs/Pleura: The central tracheobronchial airways are patent. Centrilobular emphysema evident. No focal airspace consolidation. No pulmonary edema or pleural effusion. Tiny left lower lobe calcified granuloma evident.  Upper Abdomen: Areas of cortical scarring are noted in the kidneys bilaterally.  Musculoskeletal: No worrisome lytic or sclerotic osseous abnormality.  Review of the MIP images confirms the above findings.  IMPRESSION: 1. No substantial change in the ascending thoracic aortic aneurysm measuring 5.1 cm today at the level of the sinuses of Valsalva and 4.1 cm at the mid ascending segment. 2.  Emphysema. (ICD10-J43.9) 3.  Aortic  Atherosclerois (ICD10-170.0)   Electronically Signed   By: Misty Stanley M.D.   On: 04/13/2018 10:58 I personally reviewed the CT images and concur with the findings noted above.  The aneurysm is unchanged.  Impression: [name] [name] is a 72 year old gentleman with a history of a bicuspid aortic valve with stenosis requiring replacement with bioprosthetic valve in 2007.  He has a known aortic root and ascending aortic aneurysm.  That has been stable over time.  His CT from earlier this month showed no increase in size in the aortic root/ascending aneurysm.  He needs continued semiannual follow-up.  Hypertension-blood pressure control much better on today's visit  Tobacco abuse-quit smoking in 2017.  Commended him for quitting and stressed the importance of continued abstinence.  COPD-stable at baseline, requires bronchodilators.  Plan: Return in 6 months with CT angiogram  Melrose Nakayama, MD [facility] [phone]

## 2018-09-07 ENCOUNTER — Other Ambulatory Visit: Payer: Self-pay | Admitting: *Deleted

## 2018-09-07 DIAGNOSIS — I712 Thoracic aortic aneurysm, without rupture, unspecified: Secondary | ICD-10-CM

## 2018-10-18 ENCOUNTER — Ambulatory Visit
Admission: RE | Admit: 2018-10-18 | Discharge: 2018-10-18 | Disposition: A | Discharge status: Home / Self Care | Payer: Medicare Other | Source: Ambulatory Visit | Attending: Thoracic Surgery (Cardiothoracic Vascular Surgery) | Admitting: Thoracic Surgery (Cardiothoracic Vascular Surgery)

## 2018-10-18 DIAGNOSIS — I712 Thoracic aortic aneurysm, without rupture, unspecified: Secondary | ICD-10-CM

## 2018-10-18 MED ORDER — IOPAMIDOL (ISOVUE-370) INJECTION 76%
75.0000 mL | Freq: Once | INTRAVENOUS | Status: AC | PRN
  Administered 2018-10-18: 75 mL via INTRAVENOUS

## 2018-10-23 ENCOUNTER — Encounter: Payer: Self-pay | Admitting: Thoracic Surgery (Cardiothoracic Vascular Surgery)

## 2018-10-23 ENCOUNTER — Ambulatory Visit (INDEPENDENT_AMBULATORY_CARE_PROVIDER_SITE_OTHER): Payer: Medicare Other | Admitting: Thoracic Surgery (Cardiothoracic Vascular Surgery)

## 2018-10-23 ENCOUNTER — Other Ambulatory Visit: Payer: Self-pay

## 2018-10-23 VITALS — BP 165/87 | HR 81 | Resp 18 | Ht 66.0 in | Wt 168.8 lb

## 2018-10-23 DIAGNOSIS — I712 Thoracic aortic aneurysm, without rupture, unspecified: Secondary | ICD-10-CM

## 2018-10-23 MED ORDER — ATENOLOL 25 MG PO TABS: 25.0000 mg | ORAL_TABLET | Freq: Every day | ORAL | 5 refills | Status: DC

## 2018-10-23 NOTE — Progress Notes (Signed)
[address]       [place] ,[place] [address]             [phone]     HPI: [name] [name] returns for a scheduled follow-up visit  [name] [name] is a 73 year old man with a past history of a bicuspid aortic valve with aortic stenosis.  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  Past medical history is also significant for an ascending aneurysm, tobacco abuse, COPD, arthritis, hypertension, back pain, GI bleeding, and restless leg syndrome.  He was first noted to have a dilated aortic root on echocardiogram in 2016.  A CT of his chest showed a 5.2 cm aortic root and a 4.3 cm ascending aneurysm.  He quit smoking in 2017.  He does have dyspnea with exertion due to COPD.  That has been stable.  He is not having any chest pain, pressure, or tightness.  He was having issues with low blood pressure prior to his last visit and his lisinopril was decreased to 5 mg daily.  He has been taking his medications regularly.  He says he is not getting enough exercise, but plans to increase that.  Past Medical History:  Diagnosis Date  . Aortic stenosis    s/p bioprosthetic valve replacement  . Ascending aortic aneurysm ([place])   . CAD (coronary artery disease) of bypass graft    s/p SVG to dRCA  . Cardiomyopathy 2006   EF 30%; has history of CHF  . CHF (congestive heart failure) ([place])   . COPD (chronic obstructive pulmonary disease) ([facility])    FEV1/FVC -> 0.52 (69% of predicted)  . Osteoarthritis   . Tobacco abuse     Current Outpatient Medications  Medication Sig Dispense Refill  . albuterol (PROVENTIL HFA;VENTOLIN HFA) 108 (90 Base) MCG/ACT inhaler Inhale 2 puffs into the lungs every 6 (six) hours as needed for wheezing or shortness of breath.    Marland Kitchen albuterol (PROVENTIL) (2.5 MG/3ML) 0.083% nebulizer solution Take 2.5 mg by nebulization every 6 (six) hours as needed for wheezing or shortness of breath.    Marland Kitchen aspirin EC 81 MG tablet Take 1 tablet (81 mg  total) daily by mouth. 90 tablet 3  . atorvastatin (LIPITOR) 40 MG tablet TAKE 1 TABLET(40 MG) BY MOUTH DAILY AT 6 PM 90 tablet 3  . budesonide-formoterol (SYMBICORT) 160-4.5 MCG/ACT inhaler Inhale 2 puffs into the lungs 2 (two) times daily.    . Cholecalciferol (VITAMIN D PO) Take 1 tablet by mouth daily.    . Cyanocobalamin (VITAMIN B-12 PO) Take 1 tablet by mouth daily.    Marland Kitchen dextromethorphan-guaiFENesin (MUCINEX DM) 30-600 MG per 12 hr tablet Take 1 tablet by mouth every 12 (twelve) hours as needed for cough (mucous build up).     . furosemide (LASIX) 40 MG tablet Take 1 tablet by mouth at bedtime.     . Ipratropium-Albuterol (COMBIVENT RESPIMAT) 20-100 MCG/ACT AERS respimat Inhale 1 puff into the lungs every 6 (six) hours as needed for wheezing or shortness of breath.    . lisinopril (PRINIVIL,ZESTRIL) 5 MG tablet TK 1 T PO QD    . Multiple Vitamin (MULTIVITAMIN WITH MINERALS) TABS tablet Take 1 tablet by mouth daily. Alive    . pantoprazole (PROTONIX) 40 MG tablet Take 1 tablet (40 mg total) by mouth daily. 30 tablet 0  . potassium chloride (KLOR-CON) 10 MEQ CR tablet Take 10 mEq by mouth at bedtime.     Marland Kitchen  pramipexole (MIRAPEX) 0.125 MG tablet Take 0.125 mg by mouth daily as needed (restless legs).     . predniSONE (DELTASONE) 10 MG tablet 40 mg x 2 days, then 30 mg x 2 days, then 20 mg x 2 days, then 10 mg x 2 days and stop 20 tablet 0  . tiZANidine (ZANAFLEX) 4 MG tablet Take 4 mg 2 (two) times daily as needed by mouth.  2  . atenolol (TENORMIN) 25 MG tablet Take 1 tablet (25 mg total) by mouth daily. 30 tablet 5  . ipratropium-albuterol (DUONEB) 0.5-2.5 (3) MG/3ML SOLN VVN Q 6 H PRN     No current facility-administered medications for this visit.     Physical Exam BP (!) 165/87 (BP Location: Left Arm, Patient Position: Sitting, Cuff Size: Normal)   Pulse 81   Resp 18   Ht 5\' 6"  (1.676 m)   Wt 168 lb 12.8 oz (76.6 kg)   SpO2 94% Comment: RA  BMI 27.60 kg/m  73 year old man in no  acute distress Alert and oriented x3 with no focal neurologic deficits Cardiac regular rate and rhythm normal S1 and S2 no rubs or murmurs Lungs diminished breath sounds bilaterally, no wheezing No peripheral edema  Diagnostic Tests: CT ANGIOGRAPHY CHEST WITH CONTRAST  TECHNIQUE: Multidetector CT imaging of the chest was performed using the standard protocol during bolus administration of intravenous contrast. Multiplanar CT image reconstructions and MIPs were obtained to evaluate the vascular anatomy.  CONTRAST:  51mL ISOVUE-370 IOPAMIDOL (ISOVUE-370) INJECTION 76%  COMPARISON:  04/13/2017  FINDINGS: Cardiovascular: Ascending thoracic aortic aneurysm again noted, measuring maximally 5.2 cm at the sinuses of Valsalva on coronal image 61, unchanged. No dissection. Changes of prior aortic valve replacement and CABG. Scattered aortic atherosclerosis. Heart is normal size.  Mediastinum/Nodes: No mediastinal, hilar, or axillary adenopathy.  Lungs/Pleura: Mild centrilobular emphysema. Heart and mediastinal contours are within normal limits. No focal opacities or effusions. No acute bony abnormality.  Upper Abdomen: Imaging into the upper abdomen shows no acute findings.  Musculoskeletal: Chest wall soft tissues are unremarkable. No acute bony abnormality.  Review of the MIP images confirms the above findings.  IMPRESSION: Stable ascending thoracic aortic aneurysm, 5.2 cm maximally at the sinuses of Valsalva.  Prior CABG and aortic valve replacement.  Aortic Atherosclerosis (ICD10-I70.0) and Emphysema (ICD10-J43.9).   Electronically Signed   By: Rolm Baptise M.D.   On: 10/18/2018 11:47 I personally reviewed the CT images and concur with the findings noted above  Impression: [name] [name] is a 73 year old gentleman with a history of bicuspid aortic valve, aortic stenosis, aortic valve replacement, aortic root aneurysm, heavy tobacco abuse (quit 2017), COPD,  hypertension, arthritis, and back pain.  He has been followed for an aortic root aneurysm since 2016.  Has been stable since that time.  His CT today shows no significant change.  Hypertension-blood pressure is elevated at 165/87.  I am not happy with that blood pressure at all.  I think he would benefit from a beta-blocker if he can tolerate it with his COPD.  I am going to give him a prescription for atenolol 25 mg daily.  I did encourage him to check his blood pressure on a regular basis.  It has been a little over 6 months since he saw cardiology.  He needs a follow-up with Dr. Harrington Challenger or 1 of the practitioners as well.  Plan:  Add atenolol 25 mg po daily Return in 6 months with CT angiogram of chest Follow up with Dr.  Magnus Sinning, MD [facility] [phone]

## 2018-11-12 ENCOUNTER — Encounter: Payer: Self-pay | Admitting: Physician Assistant

## 2018-11-12 ENCOUNTER — Ambulatory Visit (INDEPENDENT_AMBULATORY_CARE_PROVIDER_SITE_OTHER): Payer: Medicare Other | Admitting: Physician Assistant

## 2018-11-12 VITALS — BP 138/82 | HR 61 | Ht 66.0 in | Wt 167.8 lb

## 2018-11-12 DIAGNOSIS — I5022 Chronic systolic (congestive) heart failure: Secondary | ICD-10-CM

## 2018-11-12 DIAGNOSIS — I2510 Atherosclerotic heart disease of native coronary artery without angina pectoris: Secondary | ICD-10-CM

## 2018-11-12 DIAGNOSIS — I712 Thoracic aortic aneurysm, without rupture, unspecified: Secondary | ICD-10-CM

## 2018-11-12 DIAGNOSIS — I359 Nonrheumatic aortic valve disorder, unspecified: Secondary | ICD-10-CM

## 2018-11-12 DIAGNOSIS — Z952 Presence of prosthetic heart valve: Secondary | ICD-10-CM

## 2018-11-12 DIAGNOSIS — E782 Mixed hyperlipidemia: Secondary | ICD-10-CM

## 2018-11-12 DIAGNOSIS — I10 Essential (primary) hypertension: Secondary | ICD-10-CM

## 2018-11-12 MED ORDER — LISINOPRIL 5 MG PO TABS: 7.5000 mg | ORAL_TABLET | Freq: Every day | ORAL | 2 refills | Status: DC

## 2018-11-12 NOTE — Progress Notes (Signed)
Cardiology Office Note    Date:  11/12/2018   ID:  [name], [name] 1946-07-10, MRN [id number]  PCP:  Nolene Ebbs, MD  Cardiologist:  Harrington Challenger  Chief Complaint: 9 Months follow up  History of Present Illness:   [name] [name] is a 73 y.o. male with hx of aortic valve disorder s/p bioprosthetic AVR in 2007, CAD s/p CABG (SVG to [facility]) ascending aortic aneurysm, chronic systolic CHF, HLD, COPD and tobacco abuse presents for follow up.  Followed with Dr. Latanya Maudlin for bicuspid aortic valve, aortic stenosis, s/p aortic valve replacement, aortic root aneurysm. Last seen 10/23/18. Recent CT scan without significant changes. Noted elevated blood pressure. Started on atenolol 25mg  daily given COPD.   Here today for follow up.  Patient has chronic shortness of breath due to underlying COPD.  No exacerbation of dyspnea since on atenolol.  He states that at rest his blood pressure ranging 062-376E systolically and 831-517O with minimal exertion at home.  He was hypotensive with systolic blood pressure in 70s on higher dose of lisinopril at 10 mg daily.  Currently on 5 mg daily.  Blood pressure of 138/82 and repeat check 140/84.  Denies chest pain, palpitation lower extremity edema.   Past Medical History:  Diagnosis Date  . Aortic stenosis    s/p bioprosthetic valve replacement  . Ascending aortic aneurysm ([place])   . CAD (coronary artery disease) of bypass graft    s/p SVG to dRCA  . Cardiomyopathy 2006   EF 30%; has history of CHF  . CHF (congestive heart failure) ([place])   . COPD (chronic obstructive pulmonary disease) ([facility])    FEV1/FVC -> 0.52 (69% of predicted)  . Osteoarthritis   . Tobacco abuse     Past Surgical History:  Procedure Laterality Date  . AORTIC VALVE REPLACEMENT     bioprosthetic, 01/2006 - cannot find record of surgery (has scar)  . COLONOSCOPY N/A 01/01/2017   Procedure: COLONOSCOPY;  Surgeon: Laurence Spates, MD;  Location: [facility] ENDOSCOPY;  Service: Endoscopy;   Laterality: N/A;  . ESOPHAGOGASTRODUODENOSCOPY N/A 01/01/2017   Procedure: ESOPHAGOGASTRODUODENOSCOPY (EGD);  Surgeon: Laurence Spates, MD;  Location: [facility] ENDOSCOPY;  Service: Endoscopy;  Laterality: N/A;  . TONSILLECTOMY      Current Medications: Prior to Admission medications   Medication Sig Start Date End Date Taking? Authorizing Provider  albuterol (PROVENTIL HFA;VENTOLIN HFA) 108 (90 Base) MCG/ACT inhaler Inhale 2 puffs into the lungs every 6 (six) hours as needed for wheezing or shortness of breath.    [provider]  albuterol (PROVENTIL) (2.5 MG/3ML) 0.083% nebulizer solution Take 2.5 mg by nebulization every 6 (six) hours as needed for wheezing or shortness of breath.    [provider]  aspirin EC 81 MG tablet Take 1 tablet (81 mg total) daily by mouth. 08/18/17   Fay Records, MD  atenolol (TENORMIN) 25 MG tablet Take 1 tablet (25 mg total) by mouth daily. 10/23/18   Melrose Nakayama, MD  atorvastatin (LIPITOR) 40 MG tablet TAKE 1 TABLET(40 MG) BY MOUTH DAILY AT 6 PM 03/15/18   Fay Records, MD  budesonide-formoterol [facility]) 160-4.5 MCG/ACT inhaler Inhale 2 puffs into the lungs 2 (two) times daily.    [provider]  Cholecalciferol (VITAMIN D PO) Take 1 tablet by mouth daily.    [provider]  Cyanocobalamin (VITAMIN B-12 PO) Take 1 tablet by mouth daily.    [provider]  dextromethorphan-guaiFENesin (MUCINEX DM) 30-600 MG per 12 hr tablet  Take 1 tablet by mouth every 12 (twelve) hours as needed for cough (mucous build up).     [provider]  furosemide (LASIX) 40 MG tablet Take 1 tablet by mouth at bedtime.  11/04/16   [provider]  Ipratropium-Albuterol (COMBIVENT RESPIMAT) 20-100 MCG/ACT AERS respimat Inhale 1 puff into the lungs every 6 (six) hours as needed for wheezing or shortness of breath.    [provider]  ipratropium-albuterol (DUONEB) 0.5-2.5 (3) MG/3ML SOLN VVN Q 6 H PRN 09/27/18    [provider]  lisinopril (PRINIVIL,ZESTRIL) 5 MG tablet TK 1 T PO QD 10/11/18   [provider]  Multiple Vitamin (MULTIVITAMIN WITH MINERALS) TABS tablet Take 1 tablet by mouth daily. Alive    [provider]  pantoprazole (PROTONIX) 40 MG tablet Take 1 tablet (40 mg total) by mouth daily. 01/01/17   Geradine Girt, DO  potassium chloride (KLOR-CON) 10 MEQ CR tablet Take 10 mEq by mouth at bedtime.     [provider]  pramipexole (MIRAPEX) 0.125 MG tablet Take 0.125 mg by mouth daily as needed (restless legs).  12/28/16   [provider]  predniSONE (DELTASONE) 10 MG tablet 40 mg x 2 days, then 30 mg x 2 days, then 20 mg x 2 days, then 10 mg x 2 days and stop 01/01/17   Eulogio Bear U, DO  tiZANidine (ZANAFLEX) 4 MG tablet Take 4 mg 2 (two) times daily as needed by mouth. 07/31/17   [provider]    Allergies:   Patient has no known allergies.   Social History   Socioeconomic History  . Marital status: Single    Spouse name: Not on file  . Number of children: 0  . Years of education: Not on file  . Highest education level: Not on file  Occupational History  . Occupation: Retired    Comment: Manufacturing systems engineer  . Financial resource strain: Not on file  . Food insecurity:    Worry: Not on file    Inability: Not on file  . Transportation needs:    Medical: Not on file    Non-medical: Not on file  Tobacco Use  . Smoking status: Former Smoker    Packs/day: 1.00    Years: 50.00    Pack years: 50.00    Types: Cigarettes    Last attempt to quit: 10/04/2015    Years since quitting: 3.1  . Smokeless tobacco: Never Used  Substance and Sexual Activity  . Alcohol use: No    Comment: states he quit many years ago  . 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 Sexual activity: Not on file  Lifestyle  . Physical activity:    Days per week: Not on file    Minutes per session: Not on file  . Stress:  Not on file  Relationships  . Social connections:    Talks on phone: Not on file    Gets together: Not on file    Attends religious service: Not on file    Active member of club or organization: Not on file    Attends meetings of clubs or organizations: Not on file    Relationship status: Not on file  Other Topics Concern  . Not on file  Social History Narrative  . Not on file     Family History:  The patient's family history includes Cancer in his brother and mother; Coronary artery  disease in his father; Heart failure in his father.   ROS:   Please see the history of present illness.    ROS All other systems reviewed and are negative.   PHYSICAL EXAM:   VS:  BP 138/82   Pulse 61   Ht 5\' 6"  (1.676 m)   Wt 167 lb 12.8 oz (76.1 kg)   SpO2 93%   BMI 27.08 kg/m    GEN: Well nourished, well developed, in no acute distress  HEENT: normal  Neck: no JVD, carotid bruits, or masses Cardiac: RRR; no murmurs, rubs, or gallops,no edema  Respiratory:  clear to auscultation bilaterally, normal work of breathing GI: soft, nontender, nondistended, + BS MS: no deformity or atrophy  Skin: warm and dry, no rash Neuro:  Alert and Oriented x 3, Strength and sensation are intact Psych: euthymic mood, full affect  Wt Readings from Last 3 Encounters:  11/12/18 167 lb 12.8 oz (76.1 kg)  10/23/18 168 lb 12.8 oz (76.6 kg)  04/24/18 168 lb (76.2 kg)      Studies/Labs Reviewed:   EKG:  EKG is ordered today.  The ekg ordered today demonstrates sinus rhythm at rate of 61 bpm, revealed inversion in lateral lead which is chronic  Recent Labs: No results found for requested labs within last 8760 hours.   Lipid Panel    Component Value Date/Time   CHOL 143 08/18/2017 1057   TRIG 34 08/18/2017 1057   HDL 69 08/18/2017 1057   CHOLHDL 2.1 08/18/2017 1057   CHOLHDL 2.2 03/18/2016 1016   VLDL 10 03/18/2016 1016   LDLCALC 67 08/18/2017 1057    Additional studies/ records that were reviewed  today include:   Echocardiogram: 01/2017 Study Conclusions  - Left ventricle: The cavity size was normal. There was moderate   concentric hypertrophy. Systolic function was moderately reduced.   The estimated ejection fraction was in the range of 35% to 40%.   Diffuse hypokinesis. Doppler parameters are consistent with   abnormal left ventricular relaxation (grade 1 diastolic   dysfunction). Doppler parameters are consistent with   indeterminate ventricular filling pressure. - Aortic valve: A bioprosthesis was present. There was mild   stenosis. There was no regurgitation. Peak velocity (S): 303   cm/s. Mean gradient (S): 24 mm Hg. - Aorta: Ascending aortic diameter: 46 mm (S). - Ascending aorta: The ascending aorta was moderately dilated. - Mitral valve: Transvalvular velocity was within the normal range.   There was no evidence for stenosis. There was mild regurgitation. - Right ventricle: The cavity size was normal. Wall thickness was   normal. Systolic function was normal. - Atrial septum: No defect or patent foramen ovale was identified   by color flow Doppler. - Tricuspid valve: There was mild regurgitation. - Pulmonary arteries: Systolic pressure was within the normal   range. PA peak pressure: 23 mm Hg (S).  Impressions:  - Since the echo 01/02/15, bioprosthetic aortic valve mean gradient   has increased from 17 mmHg to 24 mmHg. Ascending aorta diameter   is unchanged.   CT angio of chest/aorta 10/2018 IMPRESSION: Stable ascending thoracic aortic aneurysm, 5.2 cm maximally at the sinuses of Valsalva.  Prior CABG and aortic valve replacement.  Aortic Atherosclerosis (ICD10-I70.0) and Emphysema (ICD10-J43.9).  ASSESSMENT & PLAN:     1. S/p AVR - Last echo 01/2017 showed LVEF of 35-40%, grade 1 DD, bioprosthetic aortic valve mean gradient has increased from 17 mmHg to 24 mmHg. Will repeat echo.  No syncope.  2. thoracic aortic aneurysm  - Followed by Dr.  Latanya Maudlin. Stable based on recent Ct.   3. Chronic systolic CHF -Patient appears euvolemic.  Continue beta-blocker and lisinopril as below.  Lasix at current dose 40 mg daily.  4. HTN - He states that at rest his systolic blood pressure  Runs in 110-120s  and 130-140s with minimal exertion at home.  Blood pressure of 138/82 and repeat check 140/84 manually by my self. Hx of hypotension on lisinopril 10 mg. -After long discussion, we will increase lisinopril to 7.5 mg daily and continue atenolol 25 mg daily. -Nurse visit for blood pressure check including echocardiogram.  He will bring his home machine.  5. CAD s/p CABG -No angina.  Continue aspirin, statin and beta-blocker.  6. HLD -Continue statin.   Medication Adjustments/Labs and Tests Ordered: Current medicines are reviewed at length with the patient today.  Concerns regarding medicines are outlined above.  Medication changes, Labs and Tests ordered today are listed in the Patient Instructions below. Patient Instructions  Medication Instructions:  Your physician has recommended you make the following change in your medication: 1. Increase Lisinopril to one and one half tablets daily (7.5) mg. Sent to requested pharmacy.   Labwork: -None   Testing/Procedures: Your physician has requested that you have an echocardiogram. Echocardiography is a painless test that uses sound waves to create images of your heart. It provides your doctor with information about the size and shape of your heart and how well your heart's chambers and valves are working. This procedure takes approximately one hour. There are no restrictions for this procedure.  Same day as echo a nurse visit to check bp/bring your cuff.  Follow-Up: Your physician wants you to follow-up in: 6 months with Dr.Ross. You will receive a reminder letter in the mail two months in advance. If you don't receive a letter, please call our office to schedule the follow-up  appointment.   Any Other Special Instructions Will Be Listed Below (If Applicable).     If you need a refill on your cardiac medications before your next appointment, please call your pharmacy.      Jarrett Soho, [place]  11/12/2018 3:46 PM    [facility] [address], [place], [place]  [address] Phone: [phone]; Fax: [phone]

## 2018-11-12 NOTE — Patient Instructions (Signed)
Medication Instructions:  Your physician has recommended you make the following change in your medication: 1. Increase Lisinopril to one and one half tablets daily (7.5) mg. Sent to requested pharmacy.   Labwork: -None   Testing/Procedures: Your physician has requested that you have an echocardiogram. Echocardiography is a painless test that uses sound waves to create images of your heart. It provides your doctor with information about the size and shape of your heart and how well your heart's chambers and valves are working. This procedure takes approximately one hour. There are no restrictions for this procedure.  Same day as echo a nurse visit to check bp/bring your cuff.  Follow-Up: Your physician wants you to follow-up in: 6 months with Dr.Ross. You will receive a reminder letter in the mail two months in advance. If you don't receive a letter, please call our office to schedule the follow-up appointment.   Any Other Special Instructions Will Be Listed Below (If Applicable).     If you need a refill on your cardiac medications before your next appointment, please call your pharmacy.

## 2018-11-22 ENCOUNTER — Ambulatory Visit (INDEPENDENT_AMBULATORY_CARE_PROVIDER_SITE_OTHER): Payer: Medicare Other | Admitting: *Deleted

## 2018-11-22 ENCOUNTER — Ambulatory Visit (HOSPITAL_COMMUNITY): Payer: Medicare Other | Attending: Cardiovascular Disease

## 2018-11-22 VITALS — BP 152/88 | HR 70 | Ht 66.0 in | Wt 167.0 lb

## 2018-11-22 DIAGNOSIS — I10 Essential (primary) hypertension: Secondary | ICD-10-CM | POA: Diagnosis not present

## 2018-11-22 DIAGNOSIS — I359 Nonrheumatic aortic valve disorder, unspecified: Secondary | ICD-10-CM | POA: Diagnosis not present

## 2018-11-22 DIAGNOSIS — Z952 Presence of prosthetic heart valve: Secondary | ICD-10-CM | POA: Insufficient documentation

## 2018-11-22 LAB — ECHOCARDIOGRAM COMPLETE
Height: 66 in
Weight: 2672 oz

## 2018-11-22 NOTE — Progress Notes (Signed)
Pt came in for B/P check and per pt is running okay at home usually 127/68 and heart rate 70 Per pt is only elevated with walking due to COPD and also pt does not take Lisinopril or Atenolol until pm the patient did not bring B/P log in or B/P cuff will bring to next office visit Also pt will call in a week with B/P readings and will also start taking Atenolol in am and Lisinopril in pm .

## 2018-12-12 ENCOUNTER — Telehealth: Payer: Self-pay | Admitting: Internal Medicine

## 2018-12-12 NOTE — Telephone Encounter (Signed)
Dr. Harrington Challenger can pt hold ASA for oral surgery and other than SBE prophylaxis any other recommendations?

## 2018-12-12 NOTE — Telephone Encounter (Signed)
New Message       [facility] Pre-operative Risk Assessment    Request for surgical clearance:  1. What type of surgery is being performed? Oral Surgery  2. When is this surgery scheduled? 12/20/18 but needs the clearance by 12/18/18  3. What type of clearance is required (medical clearance vs. Pharmacy clearance to hold med vs. Both)? Both  4. Are there any medications that need to be held prior to surgery and how long? Needs to know from cardiologist  5. Practice name and name of physician performing surgery? Dr. Diona Browner    6. What is your office phone number [phone]   7.   What is your office fax number [phone]  8.   Anesthesia type (None, local, MAC, general) ? General   Andree Coss 12/12/2018, 3:43 PM  _________________________________________________________________   (provider comments below)

## 2018-12-13 ENCOUNTER — Encounter: Payer: Self-pay | Admitting: *Deleted

## 2018-12-13 ENCOUNTER — Other Ambulatory Visit: Payer: Self-pay | Admitting: Cardiology

## 2018-12-13 MED ORDER — AMOXICILLIN 500 MG PO TABS: 2000.0000 mg | ORAL_TABLET | Freq: Once | ORAL | 2 refills | Status: AC

## 2018-12-13 NOTE — Telephone Encounter (Signed)
   Primary Cardiologist: Dorris Carnes, MD  Chart reviewed as part of pre-operative protocol coverage. Patient was contacted 12/13/2018 in reference to pre-operative risk assessment for pending surgery as outlined below.  [name] [name] was last seen on 11/12/18 by Ferol Luz, PA.  Since that day, [name] [name] has done well with hx of AVR in 2007, and CAD with CABG.  No chest pain and his chronic SOB due to COPD is stable.    I discussed pt with Dr. Harrington Challenger and she stated he could hold asprin for 5 days prior to procedure.  He will need amoxicillin 2 Gm po an hour prior to procedure.  I have sent that to his pharmacy and pt is aware.   Therefore, based on ACC/AHA guidelines, the patient would be at acceptable risk for the planned procedure without further cardiovascular testing.   I will route this recommendation to the requesting party via Epic fax function and remove from pre-op pool.  Please call with questions.  Cecilie Kicks, NP 12/13/2018, 1:48 PM

## 2018-12-13 NOTE — Telephone Encounter (Signed)
This encounter was created in error - please disregard.

## 2018-12-18 NOTE — H&P (Signed)
HISTORY AND PHYSICAL  [name] [name] is a 73 y.o. male patient with CC: painful teeth.   PMH: AS, CHF, COPD, Ascending Aortic Aneurysm. S/p AVR 2006  No diagnosis found.  No past medical history on file.  No current facility-administered medications for this encounter.    Current Outpatient Medications  Medication Sig Dispense Refill  . acetaminophen (TYLENOL) 500 MG tablet Take 1,000 mg by mouth every 6 (six) hours as needed for moderate pain or headache.    . albuterol (PROVENTIL HFA;VENTOLIN HFA) 108 (90 Base) MCG/ACT inhaler Inhale 2 puffs into the lungs every 6 (six) hours as needed for wheezing or shortness of breath.    Marland Kitchen aspirin EC 81 MG tablet Take 81 mg by mouth daily.    Marland Kitchen atorvastatin (LIPITOR) 40 MG tablet Take 40 mg by mouth daily.    . budesonide-formoterol (SYMBICORT) 160-4.5 MCG/ACT inhaler Inhale 2 puffs into the lungs 2 (two) times daily.    . Coenzyme Q10 (CO Q-10 PO) Take 1 tablet by mouth 2 (two) times a week.    . diphenhydramine-acetaminophen (TYLENOL PM) 25-500 MG TABS tablet Take 1 tablet by mouth at bedtime as needed (sleep).    . furosemide (LASIX) 40 MG tablet Take one (1) tablet(s) once daily (Patient taking differently: Take 40 mg by mouth every evening. ) 30 tablet 6  . Ipratropium-Albuterol (COMBIVENT RESPIMAT) 20-100 MCG/ACT AERS respimat Inhale 1 puff into the lungs 2 (two) times daily as needed for wheezing or shortness of breath.    Marland Kitchen ipratropium-albuterol (DUONEB) 0.5-2.5 (3) MG/3ML SOLN Take 3 mLs by nebulization 4 (four) times daily.    Marland Kitchen lisinopril (PRINIVIL,ZESTRIL) 5 MG tablet Take 5 mg by mouth daily.    . Multiple Vitamin (MULTIVITAMIN WITH MINERALS) TABS tablet Take 1 tablet by mouth daily.    . Omega-3 Fatty Acids (FISH OIL) 1000 MG CAPS Take 1,000 mg by mouth 2 (two) times a week.    . potassium chloride (K-DUR,KLOR-CON) 10 MEQ tablet Take 10 mEq by mouth every evening.    . Pseudoeph-Doxylamine-DM-APAP (NYQUIL PO) Take 1 capsule by mouth  at bedtime as needed (cold symptoms).    . Simethicone 125 MG TABS Take 125 mg by mouth daily as needed (gas).    Marland Kitchen tiZANidine (ZANAFLEX) 4 MG tablet Take 4 mg by mouth every evening.     No Known Allergies Active Problems:   * No active hospital problems. *  Vitals: There were no vitals taken for this visit. Lab results:No results found for this or any previous visit (from the past 81 hour(s)). Radiology Results: No results found. General appearance: alert, cooperative and no distress Head: Normocephalic, without obvious abnormality, atraumatic Eyes: negative Nose: Nares normal. Septum midline. Mucosa normal. No drainage or sinus tenderness. Throat: multiple carious teeth. 25mm x 72mm lesion left buccle vestibule right,  pharynx clear Neck: no adenopathy, supple, symmetrical, trachea midline and thyroid not enlarged, symmetric, no tenderness/mass/nodules Resp: clear to auscultation bilaterally Cardio: regular rate and rhythm, S1, S2 normal, no murmur, click, rub or gallop  Assessment: Non restorable/impacted teeth. Lesion right mucosa.   Plan: Multiple extractions with alveoloplasty, removal lesion. GA. Days surgery.    Diona Browner 12/18/2018

## 2018-12-19 ENCOUNTER — Other Ambulatory Visit: Payer: Self-pay

## 2018-12-19 ENCOUNTER — Encounter (HOSPITAL_COMMUNITY): Payer: Self-pay | Admitting: Certified Registered Nurse Anesthetist

## 2018-12-19 ENCOUNTER — Encounter (HOSPITAL_COMMUNITY): Payer: Self-pay | Admitting: *Deleted

## 2018-12-19 NOTE — Progress Notes (Signed)
Pt denies any acute cardiopulmonary issues. Pt stated that he is under the care of Dr. Harrington Challenger, Cardiology, Dr. Melvyn Novas, Pulmonology, Dr. Jeanie Cooks, PCP. Pt denies having a stress test and cardiac cath. Pt stated that an EKG and chest x ray were both performed at [facility] "  on [address]; "  records requested. Pt stated that his last dose of Aspirin was " last week " as instructed. Pt made aware to stop taking Stop taking vitamins, Nyquil DM, CO Q 10,  fish oil and herbal medications. Do not take any NSAIDs ie: Ibuprofen, Advil, Naproxen (Aleve), Motrin, BC and Goody Powder. Pt stated that he would ask his sister Brantley Persons or Brother-in-law Seymour Bars if he could stay with them post-op since either one of them will be providing transportation. Pt denies that the Logan's have traveled out of the country in the last 30 day. Pt denies that the Logan's have tested positive or was exposed to anyone suspected of having or positive for COVID-19. Pt denies that the Logan's have cough, cold, congestion, runny nose, fever, sore throat, SOB, N/V, and diarrhea. Pt verbalized understanding of all pre-op instructions. PA, Anesthesiology, asked to review pt history.

## 2018-12-20 ENCOUNTER — Ambulatory Visit (HOSPITAL_COMMUNITY): Admission: RE | Admit: 2018-12-20 | Payer: Medicare Other | Source: Home / Self Care | Admitting: Oral Surgery

## 2018-12-20 HISTORY — DX: Heart failure, unspecified: I50.9

## 2018-12-20 HISTORY — DX: Unspecified osteoarthritis, unspecified site: M19.90

## 2018-12-20 HISTORY — DX: Presence of spectacles and contact lenses: Z97.3

## 2018-12-20 HISTORY — DX: Essential (primary) hypertension: I10

## 2018-12-20 HISTORY — DX: Nonrheumatic aortic (valve) stenosis: I35.0

## 2018-12-20 HISTORY — DX: Thoracic aortic aneurysm, without rupture: I71.2

## 2018-12-20 HISTORY — DX: Gastro-esophageal reflux disease without esophagitis: K21.9

## 2018-12-20 HISTORY — DX: Chronic obstructive pulmonary disease, unspecified: J44.9

## 2018-12-20 HISTORY — DX: Dyspnea, unspecified: R06.00

## 2018-12-20 HISTORY — DX: Cardiac murmur, unspecified: R01.1

## 2018-12-20 SURGERY — DENTAL RESTORATION/EXTRACTIONS
Anesthesia: General | Laterality: Bilateral

## 2019-02-12 ENCOUNTER — Other Ambulatory Visit: Payer: Self-pay | Admitting: *Deleted

## 2019-02-13 ENCOUNTER — Other Ambulatory Visit: Payer: Self-pay | Admitting: *Deleted

## 2019-02-13 DIAGNOSIS — I712 Thoracic aortic aneurysm, without rupture, unspecified: Secondary | ICD-10-CM

## 2019-03-19 ENCOUNTER — Other Ambulatory Visit (HOSPITAL_COMMUNITY)
Admission: RE | Admit: 2019-03-19 | Discharge: 2019-03-19 | Disposition: A | Discharge status: Home / Self Care | Payer: Medicare Other | Source: Ambulatory Visit | Attending: Oral Surgery | Admitting: Oral Surgery

## 2019-03-19 DIAGNOSIS — Z1159 Encounter for screening for other viral diseases: Secondary | ICD-10-CM | POA: Diagnosis present

## 2019-03-20 ENCOUNTER — Other Ambulatory Visit: Payer: Self-pay

## 2019-03-20 ENCOUNTER — Encounter (HOSPITAL_COMMUNITY): Payer: Self-pay | Admitting: *Deleted

## 2019-03-20 LAB — NOVEL CORONAVIRUS, NAA (HOSP ORDER, SEND-OUT TO REF LAB; TAT 18-24 HRS): SARS-CoV-2, NAA: NOT DETECTED

## 2019-03-20 NOTE — Progress Notes (Signed)
Pt denies any acute pulmonary issues. Pt stated that he is under the care of Dr. Harrington Challenger, Cardiology. Pt denies having a stress test. Pt denies recent labs. Pt stated that his last dose of Aspirin was Saturday as instructed by MD. Pt made aware to stop taking Beta Carotene, vitamins, fish oil and herbal medications. Do not take any NSAIDs ie: Ibuprofen, Advil, Naproxen (Aleve), Motrin, BC and Goody Powder.  Pt denies that he and Arbie Cookey tested positive for COVID-19 ( pt reminded to quarantine ).   Pt denies that he and Arbie Cookey experienced the following symptoms:  Cough yes/no: No Fever (>100.20F)  yes/no: No Runny nose yes/no: No Sore throat yes/no: No Difficulty breathing/shortness of breath  yes/no: No ( no acute SOB; pt stated that he has a history of COPD)  Have you or a family member traveled in the last 14 days and where? yes/no: No  Pt reminded that hospital visitation restrictions are in effect and the importance of the restrictions.   Pt verbalized understanding of all pre-op instructions.  PA, Anesthesiology, asked to review pt cardiac clearance note in Epic.

## 2019-03-21 ENCOUNTER — Ambulatory Visit (HOSPITAL_COMMUNITY): Payer: Medicare Other | Admitting: Vascular Surgery

## 2019-03-21 NOTE — Anesthesia Preprocedure Evaluation (Addendum)
Anesthesia Evaluation  Patient identified by MRN, date of birth, ID band Patient awake    Reviewed: Allergy & Precautions, NPO status , Patient's Chart, lab work & pertinent test results  Airway Mallampati: I       Dental  (+) Poor Dentition   Pulmonary former smoker,    Pulmonary exam normal        Cardiovascular hypertension, Pt. on medications + CAD  Normal cardiovascular exam+ Valvular Problems/Murmurs  Rhythm:Regular Rate:Normal     Neuro/Psych negative psych ROS   GI/Hepatic negative GI ROS, Neg liver ROS,   Endo/Other  negative endocrine ROS  Renal/GU negative Renal ROS  negative genitourinary   Musculoskeletal   Abdominal Normal abdominal exam  (+)   Peds  Hematology  (+) anemia ,   Anesthesia Other Findings   Reproductive/Obstetrics                            Anesthesia Physical Anesthesia Plan  ASA: III  Anesthesia Plan: General   Post-op Pain Management:    Induction: Intravenous  PONV Risk Score and Plan:   Airway Management Planned: Nasal ETT  Additional Equipment:   Intra-op Plan:   Post-operative Plan: Extubation in OR  Informed Consent: I have reviewed the patients History and Physical, chart, labs and discussed the procedure including the risks, benefits and alternatives for the proposed anesthesia with the patient or authorized representative who has indicated his/her understanding and acceptance.     Dental advisory given  Plan Discussed with: CRNA  Anesthesia Plan Comments: (PAT note written 03/21/2019 by Myra Gianotti, PA-C. History of CABG/AVR 2007. Has 5.2 cm ascending TAA. Same day labs. )       Anesthesia Quick Evaluation

## 2019-03-21 NOTE — Progress Notes (Signed)
Anesthesia Chart Review: [name] [name]    Case: [id number] Date/Time: 03/22/19 0845   Procedure: MULTIPLE EXTRACTION WITH ALVEOLOPLASTY,BIOPSY (N/A )   Anesthesia type: General   Pre-op diagnosis: DENTAL CAIRES   Location: [facility] OR ROOM 02 / [place] [facility]   Surgeon: Diona Browner, DDS      DISCUSSION: Patient is a 73 year old male scheduled for the above procedure.   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  Presurgical cardiology input as outlined by Cecilie Kicks, NP, "[name] [name] was last seen on 11/12/18 by Ferol Luz, PA.  Since that day, [name] [name] has done well with hx of AVR in 2007, and CAD with CABG.  No chest pain and his chronic SOB due to COPD is stable.    I discussed pt with Dr. Harrington Challenger and she stated he could hold asprin for 5 days prior to procedure.  He will need amoxicillin 2 Gm po an hour prior to procedure.  I have sent that to his pharmacy and pt is aware."  He is a same day work-up, so he will need labs on arrival, but if acceptable and otherwise no acute changes then I would anticipate that he can proceed as planned. Has TAA, so needs careful BP monitoring perioperatively. 03/19/19 COVID test negative.   PROVIDERS: Nolene Ebbs, MD is PCP  Dorris Carnes, MD is cardiologist. Last visit 11/12/18 with Leanor Kail, [place]. Lisinopril increased. Echo updated and showed normalization of EF. Modesto Charon, MD is CT surgeon. Last visit 10/23/18. Stable CT at that time with six month follow-up planned. He advised better BP control and started him on atenolol with instructions to see cardiology.    LABS: He will need updated labs prior to  surgery.   Spirometry > 7 years ago and showed severe obstructive lund defect at that time.    IMAGES: CTA chest 10/18/18: IMPRESSION: - Stable ascending thoracic aortic aneurysm, 5.2 cm maximally at the sinuses of Valsalva. - Prior CABG and aortic valve replacement. - Aortic Atherosclerosis (ICD10-I70.0) and Emphysema (ICD10-J43.9).   EKG: 11/12/18 ([facility]): Sinus rhythm with wide QRS LAD Non-specific intraventricular block Cannot rule out septal infarct, age undetermined T wave abnormality, consider lateral ischemia - No significant change   CV: Echo 11/22/18: IMPRESSIONS  1. The left ventricle has normal systolic function, with an ejection fraction of 55-60%. The cavity size was normal. There is mildly increased left ventricular wall thickness. Left ventricular diastolic Doppler parameters are consistent with impaired  relaxation.  2. The right ventricle has normal systolic function. The cavity was normal. There is no increase in right ventricular wall thickness.  3. Left atrial size was mildly dilated.  4. Right atrial size was mildly dilated.  5. The mitral valve is normal in structure.  6. The tricuspid valve is normal in structure.  7. The aortic valve is normal in structure. Moderate thickening of the aortic valve Moderate calcification of the aortic valve.  8. There is moderate dilatation of the ascending aorta.  9. The interatrial septum was not assessed.   Past Medical History:  Diagnosis Date  . Aortic stenosis    s/p bioprosthetic valve replacement  . Ascending aortic aneurysm ([place])   . CAD (coronary artery disease) of bypass  graft    s/p SVG to dRCA  . Cardiomyopathy 2006   EF 30%; has history of CHF  . CHF (congestive heart failure) ([place])   . COPD (chronic obstructive pulmonary disease) ([facility])    FEV1/FVC -> 0.52 (69% of predicted)  . Lesion of buccal mucosa    right, with non-restoable abscessed teeth  . Osteoarthritis   . Tobacco abuse      Past Surgical History:  Procedure Laterality Date  . AORTIC VALVE REPLACEMENT     bioprosthetic, 01/2006 - cannot find record of surgery (has scar)  . COLONOSCOPY N/A 01/01/2017   Procedure: COLONOSCOPY;  Surgeon: Laurence Spates, MD;  Location: [facility] ENDOSCOPY;  Service: Endoscopy;  Laterality: N/A;  . ESOPHAGOGASTRODUODENOSCOPY N/A 01/01/2017   Procedure: ESOPHAGOGASTRODUODENOSCOPY (EGD);  Surgeon: Laurence Spates, MD;  Location: [facility] ENDOSCOPY;  Service: Endoscopy;  Laterality: N/A;  . TONSILLECTOMY      MEDICATIONS: No current facility-administered medications for this encounter.    Marland Kitchen acetaminophen (TYLENOL) 500 MG tablet  . albuterol (PROVENTIL HFA;VENTOLIN HFA) 108 (90 Base) MCG/ACT inhaler  . atorvastatin (LIPITOR) 40 MG tablet  . BETA CAROTENE PO  . budesonide-formoterol (SYMBICORT) 160-4.5 MCG/ACT inhaler  . dextromethorphan-guaiFENesin (MUCINEX DM) 30-600 MG per 12 hr tablet  . furosemide (LASIX) 40 MG tablet  . Ipratropium-Albuterol (COMBIVENT RESPIMAT) 20-100 MCG/ACT AERS respimat  . ipratropium-albuterol (DUONEB) 0.5-2.5 (3) MG/3ML SOLN  . lisinopril (PRINIVIL,ZESTRIL) 5 MG tablet  . Multiple Vitamin (MULTIVITAMIN WITH MINERALS) TABS tablet  . potassium chloride (KLOR-CON) 10 MEQ CR tablet  . tiZANidine (ZANAFLEX) 4 MG tablet  . Vitamin D, Ergocalciferol, (DRISDOL) 1.25 MG (50000 UT) CAPS capsule  . aspirin EC 81 MG tablet  Reported last ASA on 03/16/19.    Myra Gianotti, PA-C Surgical Short Stay/Anesthesiology [facility] Phone [phone] [facility] Phone [phone] 03/21/2019 11:00 AM

## 2019-03-22 ENCOUNTER — Ambulatory Visit (HOSPITAL_COMMUNITY)
Admission: RE | Admit: 2019-03-22 | Discharge: 2019-03-22 | Disposition: A | Discharge status: Home / Self Care | Payer: Medicare Other | Attending: Oral Surgery | Admitting: Oral Surgery

## 2019-03-22 ENCOUNTER — Other Ambulatory Visit: Payer: Self-pay

## 2019-03-22 ENCOUNTER — Encounter (HOSPITAL_COMMUNITY)
Admission: RE | Disposition: A | Discharge status: Home / Self Care | Payer: Self-pay | Source: Home / Self Care | Attending: Oral Surgery

## 2019-03-22 ENCOUNTER — Encounter (HOSPITAL_COMMUNITY): Payer: Self-pay

## 2019-03-22 DIAGNOSIS — Z79899 Other long term (current) drug therapy: Secondary | ICD-10-CM | POA: Insufficient documentation

## 2019-03-22 DIAGNOSIS — K029 Dental caries, unspecified: Secondary | ICD-10-CM | POA: Insufficient documentation

## 2019-03-22 DIAGNOSIS — Z952 Presence of prosthetic heart valve: Secondary | ICD-10-CM | POA: Diagnosis not present

## 2019-03-22 DIAGNOSIS — I5022 Chronic systolic (congestive) heart failure: Secondary | ICD-10-CM | POA: Insufficient documentation

## 2019-03-22 DIAGNOSIS — J439 Emphysema, unspecified: Secondary | ICD-10-CM | POA: Diagnosis not present

## 2019-03-22 DIAGNOSIS — I712 Thoracic aortic aneurysm, without rupture: Secondary | ICD-10-CM | POA: Diagnosis not present

## 2019-03-22 DIAGNOSIS — Z87891 Personal history of nicotine dependence: Secondary | ICD-10-CM | POA: Insufficient documentation

## 2019-03-22 DIAGNOSIS — Z7951 Long term (current) use of inhaled steroids: Secondary | ICD-10-CM | POA: Insufficient documentation

## 2019-03-22 DIAGNOSIS — E785 Hyperlipidemia, unspecified: Secondary | ICD-10-CM | POA: Diagnosis not present

## 2019-03-22 DIAGNOSIS — I2510 Atherosclerotic heart disease of native coronary artery without angina pectoris: Secondary | ICD-10-CM | POA: Diagnosis not present

## 2019-03-22 DIAGNOSIS — K011 Impacted teeth: Secondary | ICD-10-CM | POA: Insufficient documentation

## 2019-03-22 DIAGNOSIS — Z5309 Procedure and treatment not carried out because of other contraindication: Secondary | ICD-10-CM | POA: Insufficient documentation

## 2019-03-22 DIAGNOSIS — Z7982 Long term (current) use of aspirin: Secondary | ICD-10-CM | POA: Insufficient documentation

## 2019-03-22 DIAGNOSIS — Z951 Presence of aortocoronary bypass graft: Secondary | ICD-10-CM | POA: Diagnosis not present

## 2019-03-22 HISTORY — DX: Unspecified lesions of oral mucosa: K13.70

## 2019-03-22 LAB — CBC
HCT: 33.3 % — ABNORMAL LOW (ref 39.0–52.0)
Hemoglobin: 10 g/dL — ABNORMAL LOW (ref 13.0–17.0)
MCH: 24 pg — ABNORMAL LOW (ref 26.0–34.0)
MCHC: 30 g/dL (ref 30.0–36.0)
MCV: 80 fL (ref 80.0–100.0)
Platelets: 154 10*3/uL (ref 150–400)
RBC: 4.16 MIL/uL — ABNORMAL LOW (ref 4.22–5.81)
RDW: 15.8 % — ABNORMAL HIGH (ref 11.5–15.5)
WBC: 6.2 10*3/uL (ref 4.0–10.5)
nRBC: 0 % (ref 0.0–0.2)

## 2019-03-22 LAB — BASIC METABOLIC PANEL
Anion gap: 10 (ref 5–15)
BUN: 15 mg/dL (ref 8–23)
CO2: 24 mmol/L (ref 22–32)
Calcium: 8.7 mg/dL — ABNORMAL LOW (ref 8.9–10.3)
Chloride: 106 mmol/L (ref 98–111)
Creatinine, Ser: 0.96 mg/dL (ref 0.61–1.24)
GFR calc Af Amer: >60 mL/min (ref >60)
GFR calc non Af Amer: >60 mL/min (ref >60)
Glucose, Bld: 96 mg/dL (ref 70–99)
Potassium: 3.5 mmol/L (ref 3.5–5.1)
Sodium: 140 mmol/L (ref 135–145)

## 2019-03-22 SURGERY — MULTIPLE EXTRACTION WITH ALVEOLOPLASTY
Anesthesia: General

## 2019-03-22 MED ORDER — PROPOFOL 10 MG/ML IV BOLUS
INTRAVENOUS | Status: AC
  Filled 2019-03-22: qty 20

## 2019-03-22 MED ORDER — MIDAZOLAM HCL 2 MG/2ML IJ SOLN
INTRAMUSCULAR | Status: AC
  Filled 2019-03-22: qty 2

## 2019-03-22 MED ORDER — CEFAZOLIN SODIUM-DEXTROSE 2-4 GM/100ML-% IV SOLN: 2.0000 g | INTRAVENOUS | Status: DC

## 2019-03-22 MED ORDER — CEFAZOLIN SODIUM-DEXTROSE 2-4 GM/100ML-% IV SOLN
INTRAVENOUS | Status: AC
  Filled 2019-03-22: qty 100

## 2019-03-22 MED ORDER — LACTATED RINGERS IV SOLN: INTRAVENOUS | Status: DC

## 2019-03-22 MED ORDER — LIDOCAINE-EPINEPHRINE 2 %-1:100000 IJ SOLN
INTRAMUSCULAR | Status: AC
  Filled 2019-03-22: qty 1

## 2019-03-22 MED ORDER — FENTANYL CITRATE (PF) 250 MCG/5ML IJ SOLN
INTRAMUSCULAR | Status: AC
  Filled 2019-03-22: qty 5

## 2019-03-22 MED ORDER — OXYMETAZOLINE HCL 0.05 % NA SOLN
NASAL | Status: AC
  Filled 2019-03-22: qty 30

## 2019-03-22 NOTE — Progress Notes (Signed)
Per Dr. Jillyn Hidden from anesthesia, the pt needs to be evaluated by his heart and lung doctor before proceeding with surgery. The pt displayed labored breathing upon exertion before he was to be taken back to the OR. Pt sts he has a history of COPD, but does not use O2 at home. Pt sts he has not seen his lung doctor in a few years. Pt also sts that lately, he has been getting SOB while walking from his bedroom to his kitchen. Pt does not feel he needs to be evaluated in the ER at this time. RR 20 and unlabored, O2 97% on RA. Pt escorted to the main entrance vai wheelchair in no pain or distress.

## 2019-03-22 NOTE — H&P (Signed)
HISTORY AND PHYSICAL  [name] [name] is a 73 y.o. male patient with CC: Self referred for removal all remaining teeth. Occasional dental pain. Can't eat or chew properly  No diagnosis found.  Past Medical History:  Diagnosis Date  . Aortic stenosis    s/p bioprosthetic valve replacement  . Ascending aortic aneurysm ([place])   . CAD (coronary artery disease) of bypass graft    s/p SVG to dRCA  . Cardiomyopathy 2006   EF 30%; has history of CHF  . CHF (congestive heart failure) ([place])   . COPD (chronic obstructive pulmonary disease) (H[facility])    FEV1/FVC -> 0.52 (69% of predicted)  . Lesion of buccal mucosa    right, with non-restoable abscessed teeth  . Osteoarthritis   . Tobacco abuse     Current Facility-Administered Medications  Medication Dose Route Frequency Provider Last Rate Last Dose  . ceFAZolin (ANCEF) IVPB 2g/100 mL premix  2 g Intravenous On Call to OR Diona Browner, DDS      . lactated ringers infusion   Intravenous Continuous Diona Browner, DDS 10 mL/hr at 03/22/19 0736     No Known Allergies Active Problems:   * No active hospital problems. *  Vitals: Blood pressure (!) 148/87, pulse 100, temperature 97.9 F (36.6 C), temperature source Oral, resp. rate 20, height 5\' 6"  (1.676 m), weight 70.8 kg, SpO2 100 %. Lab results: Results for orders placed or performed during the hospital encounter of 03/22/19 (from the past 24 hour(s))  Basic metabolic panel     Status: Abnormal   Collection Time: 03/22/19  7:42 AM  Result Value Ref Range   Sodium 140 135 - 145 mmol/L   Potassium 3.5 3.5 - 5.1 mmol/L   Chloride 106 98 - 111 mmol/L   CO2 24 22 - 32 mmol/L   Glucose, Bld 96 70 - 99 mg/dL   BUN 15 8 - 23 mg/dL   Creatinine, Ser 0.96 0.61 - 1.24 mg/dL   Calcium 8.7 (L) 8.9 - 10.3 mg/dL   GFR calc non Af Amer >60 >60 mL/min   GFR calc Af Amer >60 >60 mL/min   Anion gap 10 5 - 15  CBC     Status: Abnormal   Collection Time: 03/22/19  7:42 AM  Result Value Ref Range    WBC 6.2 4.0 - 10.5 K/uL   RBC 4.16 (L) 4.22 - 5.81 MIL/uL   Hemoglobin 10.0 (L) 13.0 - 17.0 g/dL   HCT 33.3 (L) 39.0 - 52.0 %   MCV 80.0 80.0 - 100.0 fL   MCH 24.0 (L) 26.0 - 34.0 pg   MCHC 30.0 30.0 - 36.0 g/dL   RDW 15.8 (H) 11.5 - 15.5 %   Platelets 154 150 - 400 K/uL   nRBC 0.0 0.0 - 0.2 %   Radiology Results: No results found. General appearance: alert, cooperative and no distress Head: Normocephalic, without obvious abnormality, atraumatic Eyes: negative Nose: Nares normal. Septum midline. Mucosa normal. No drainage or sinus tenderness. Throat: Only teeth remaining  are carious or impacted # 3, 4, 17(impactd), 18, 19, 20, 21, 28, 29. No purulence, fluctuance, edema. Pharynx clear. Fibroma right buccal mucosa near commisure.  Neck: no adenopathy, supple, symmetrical, trachea midline and thyroid not enlarged, symmetric, no tenderness/mass/nodules Resp: diminished breath sounds bilaterally Cardio: regular rate and rhythm, S1, S2 normal, no murmur, click, rub or gallop  Assessment: 73 yo M CHF, COPD, CAD, AVR, CABG, AS, Ascending aortic aneurysm with non-restorable teeth # 3, 4,  18, 19, 20, 21, 28, 29, impacted tooth #17. Fibroma right buccal mucosa.  Plan: Full mouth extractions with alveoloplasty, removal fibroma. Day surgery. GA.   Diona Browner 03/22/2019

## 2019-04-01 ENCOUNTER — Other Ambulatory Visit (HOSPITAL_COMMUNITY)
Admission: RE | Admit: 2019-04-01 | Discharge: 2019-04-01 | Disposition: A | Discharge status: Home / Self Care | Payer: Medicare Other | Source: Ambulatory Visit | Attending: Oral Surgery | Admitting: Oral Surgery

## 2019-04-01 DIAGNOSIS — Z01812 Encounter for preprocedural laboratory examination: Secondary | ICD-10-CM | POA: Diagnosis present

## 2019-04-01 DIAGNOSIS — Z1159 Encounter for screening for other viral diseases: Secondary | ICD-10-CM | POA: Insufficient documentation

## 2019-04-01 LAB — SARS CORONAVIRUS 2 (TAT 6-24 HRS): SARS Coronavirus 2: NEGATIVE

## 2019-04-03 ENCOUNTER — Encounter (HOSPITAL_COMMUNITY): Payer: Self-pay | Admitting: Physician Assistant

## 2019-04-03 NOTE — Progress Notes (Signed)
Surgery Cancelled  PCP - Dr Jeanie Cooks Cardiologist -  Dr Dorris Carnes in 2007 Pulmonologist - Dr Melvyn Novas

## 2019-04-04 ENCOUNTER — Encounter (HOSPITAL_COMMUNITY): Admission: RE | Payer: Self-pay | Source: Home / Self Care

## 2019-04-04 ENCOUNTER — Ambulatory Visit (HOSPITAL_COMMUNITY): Admission: RE | Admit: 2019-04-04 | Payer: Medicare Other | Source: Home / Self Care | Admitting: Oral Surgery

## 2019-04-04 SURGERY — DENTAL RESTORATION/EXTRACTIONS
Anesthesia: General | Laterality: Bilateral

## 2019-05-09 ENCOUNTER — Ambulatory Visit
Admission: RE | Admit: 2019-05-09 | Discharge: 2019-05-09 | Disposition: A | Discharge status: Home / Self Care | Payer: Medicare Other | Source: Ambulatory Visit | Attending: Thoracic Surgery (Cardiothoracic Vascular Surgery) | Admitting: Thoracic Surgery (Cardiothoracic Vascular Surgery)

## 2019-05-09 DIAGNOSIS — I712 Thoracic aortic aneurysm, without rupture, unspecified: Secondary | ICD-10-CM

## 2019-05-09 MED ORDER — IOPAMIDOL (ISOVUE-370) INJECTION 76%
75.0000 mL | Freq: Once | INTRAVENOUS | Status: AC | PRN
  Administered 2019-05-09: 75 mL via INTRAVENOUS

## 2019-05-14 ENCOUNTER — Other Ambulatory Visit: Payer: Self-pay

## 2019-05-14 ENCOUNTER — Ambulatory Visit (INDEPENDENT_AMBULATORY_CARE_PROVIDER_SITE_OTHER): Payer: Medicare Other | Admitting: Thoracic Surgery (Cardiothoracic Vascular Surgery)

## 2019-05-14 ENCOUNTER — Encounter: Payer: Self-pay | Admitting: Thoracic Surgery (Cardiothoracic Vascular Surgery)

## 2019-05-14 VITALS — BP 133/69 | HR 71 | Temp 97.7°F | Resp 12 | Ht 66.0 in | Wt 156.0 lb

## 2019-05-14 DIAGNOSIS — I712 Thoracic aortic aneurysm, without rupture, unspecified: Secondary | ICD-10-CM

## 2019-05-14 NOTE — Progress Notes (Signed)
[address]       ,[place] [address]             [phone]     HPI: [name] [name] returns for a scheduled follow-up visit  [name] [name] is a 73 year old man with a history of tobacco abuse (quit 2017), COPD, aortic valve replacement for bicuspid valve with aortic stenosis, hypertension, aortic root aneurysm, back pain and GI bleeding.  I did an aortic valve replacement with a bioprosthetic valve in 2007.  His most recent echo in February 2020 showed an ejection fraction of 55 to 60% with mild left ventricular hypertrophy.  The mean gradient across the aortic valve was 16 mmHg and the peak gradient was 29 mmHg.  There was some calcification of the valve.  There was no aortic insufficiency.  He was noted to have an aortic root aneurysm on an echocardiogram in 2016.  On CT the aortic root was 5.2 cm and the ascending aneurysm was 4.3 cm.  We have been following him in for that since then.  In the interim since his last visit he has been feeling well.  He has been wearing a N 95 mask.  He says that he gets used to it but it does cause him to breathe more heavily.  He was scheduled to have dental extractions but that was canceled due to his breathing in the preop holding area.  He is not having any chest pain, pressure, or tightness  Past Medical History:  Diagnosis Date  . Aortic stenosis    s/p bioprosthetic valve replacement  . Ascending aortic aneurysm ([place])   . CAD (coronary artery disease) of bypass graft    s/p SVG to dRCA  . Cardiomyopathy 2006   EF 30%; has history of CHF  . CHF (congestive heart failure) ([place])   . COPD (chronic obstructive pulmonary disease) (H[facility])    FEV1/FVC -> 0.52 (69% of predicted)  . Lesion of buccal mucosa    right, with non-restoable abscessed teeth  . Osteoarthritis   . Tobacco abuse     Current Outpatient Medications  Medication Sig Dispense Refill  . acetaminophen (TYLENOL) 500 MG tablet Take 500 mg by mouth 2 (two) times  daily as needed (pain.).    Marland Kitchen albuterol (PROVENTIL HFA;VENTOLIN HFA) 108 (90 Base) MCG/ACT inhaler Inhale 2 puffs into the lungs every 6 (six) hours as needed for wheezing or shortness of breath.    Marland Kitchen aspirin EC 81 MG tablet Take 1 tablet (81 mg total) daily by mouth. 90 tablet 3  . atorvastatin (LIPITOR) 40 MG tablet TAKE 1 TABLET(40 MG) BY MOUTH DAILY AT 6 PM (Patient taking differently: Take 40 mg by mouth daily. ) 90 tablet 3  . BETA CAROTENE PO Take 1 tablet by mouth 2 (two) times a week.    . budesonide-formoterol (SYMBICORT) 160-4.5 MCG/ACT inhaler Inhale 2 puffs into the lungs 2 (two) times daily.    Marland Kitchen dextromethorphan-guaiFENesin (MUCINEX DM) 30-600 MG per 12 hr tablet Take 1 tablet by mouth every 12 (twelve) hours as needed for cough (mucous build up).     . furosemide (LASIX) 40 MG tablet Take 40 mg by mouth every evening.     . Ipratropium-Albuterol (COMBIVENT RESPIMAT) 20-100 MCG/ACT AERS respimat Inhale 1 puff into the lungs every 6 (six) hours as needed for wheezing or shortness of breath.    Marland Kitchen ipratropium-albuterol (DUONEB) 0.5-2.5 (3) MG/3ML SOLN Inhale 3 mLs into the lungs every 6 (six)  hours as needed (wheezing/shortness of breath.).     Marland Kitchen lisinopril (PRINIVIL,ZESTRIL) 5 MG tablet Take 1.5 tablets (7.5 mg total) by mouth daily. (Patient taking differently: Take 5 mg by mouth daily. ) 45 tablet 2  . Multiple Vitamin (MULTIVITAMIN WITH MINERALS) TABS tablet Take 1 tablet by mouth every other day. Alive    . potassium chloride (KLOR-CON) 10 MEQ CR tablet Take 10 mEq by mouth at bedtime.     Marland Kitchen tiZANidine (ZANAFLEX) 4 MG tablet Take 4 mg by mouth See admin instructions. Take 1 tablet (4 mg) by mouth scheduled at bedtime daily, may take an additional dose during the day if needed for spasms.  2  . Vitamin D, Ergocalciferol, (DRISDOL) 1.25 MG (50000 UT) CAPS capsule Take 5,000 Units by mouth every Monday.     No current facility-administered medications for this visit.     Physical  Exam BP 133/69 (BP Location: Left Arm, Patient Position: Sitting, Cuff Size: Normal)   Pulse 71   Temp 97.7 F (36.5 C)   Resp 12   Ht 5\' 6"  (1.676 m)   Wt 156 lb (70.8 kg)   SpO2 92% Comment: RA  BMI 25.1 kg/m  73 year old man in no acute distress Alert and oriented x3 with no focal deficits Wearing surgical mask Cardiac regular rate and rhythm with a 2/6 systolic murmur Lungs diminished bilaterally but otherwise clear with no wheezing No peripheral edema  Diagnostic Tests: CT ANGIOGRAPHY CHEST WITH CONTRAST  TECHNIQUE: Multidetector CT imaging of the chest was performed using the standard protocol during bolus administration of intravenous contrast. Multiplanar CT image reconstructions and MIPs were obtained to evaluate the vascular anatomy.  CONTRAST:  57mL ISOVUE-370 IOPAMIDOL (ISOVUE-370) INJECTION 76%  COMPARISON:  CT angiogram chest October 18, 2018  FINDINGS: Cardiovascular: The ascending thoracic aorta has a measured transverse diameter at the sinus of Valsalva measuring 5.2 cm, stable from previous study. This measurement was obtained on coronal sequence as was the case on the previous study. At the sinotubular junction, the transverse diameter is measured at 3.9 cm, likewise stable. At the level of the main pulmonary outflow tract, the measured transverse diameter of the aorta measures 4.6 x 4.5 cm, stable. The measured transverse diameter at the aortic arch measures 3.3 cm. The measured diameter of the descending aorta at the level of the main pulmonary outflow tract measures 2.6 x 2.5 cm. There is no thoracic aortic dissection. Visualized great vessels appear unremarkable. Patient is status post aortic valve replacement as well as coronary artery bypass grafting. There is no pericardial effusion or pericardial thickening. There is left ventricular hypertrophy. There is no demonstrable pulmonary embolus.  Mediastinum/Nodes: Thyroid appears  unremarkable. There is no appreciable thoracic adenopathy. There is no esophageal lesion.  Lungs/Pleura: There is a degree of centrilobular emphysematous change which appear stable. There is no parenchymal lung edema or consolidation. There is a degree of lower lobe bronchiectasis bilaterally. There is mild scarring in the lateral right base. No pleural effusion evident.  Upper Abdomen: Visualized upper abdominal structures appear normal except for foci of aortic atherosclerosis.  Musculoskeletal: No blastic or lytic bone lesions. Status post median sternotomy. No chest wall lesions evident.  Review of the MIP images confirms the above findings.  IMPRESSION: 1. Stable thoracic aortic prominence with the measured transverse diameter at the sinus of Valsalva measuring 5.2 cm. The overall appearance of the aorta is stable compared to the previous study. There are foci of aortic atherosclerosis.  2.  No  demonstrable pulmonary embolus.  3. Status post coronary artery bypass grafting and aortic valve replacement.  4.  Left ventricular hypertrophy.  5. There is a degree of centrilobular emphysematous change with lower lobe bronchiectatic change. Mild scarring right base. No edema or consolidation.  6.  No appreciable adenopathy.  Aortic Atherosclerosis (ICD10-I70.0) and Emphysema (ICD10-J43.9).  Electronically Signed: By: Lowella Grip III M.D. On: 05/09/2019 09:14 I personally reviewed the CT images and concur with the findings noted above   Impression: [name] [name] is a 73 year old man with a past medical history of tobacco abuse (quit 2017), COPD, aortic valve replacement for bicuspid valve with aortic stenosis, hypertension, aortic root aneurysm, back pain and GI bleeding.  I did an aortic valve replacement with a bioprosthetic valve in 2007.  His most recent echo showed good left ventricular function.  There was a moderate gradient across the valve.  That  is being followed by cardiology.  Aortic root aneurysm-stable at 5.2 to 5.3 cm.  No indication for surgery at this time.  Blood pressure control is paramount.  Needs continued semiannual follow-up.  He needs dental extractions of his remaining teeth.  Unfortunately that procedure was canceled.  Obviously he needs antibiotic coverage at the time of the procedure.  He needs to follow-up with pulmonary prior to that procedure.  Hopefully that can be done soon.  He is observing COVID precautions  Plan:  Return in 6 months with CT angiogram of chest  Melrose Nakayama, MD [facility] [phone]

## 2019-05-28 ENCOUNTER — Other Ambulatory Visit: Payer: Self-pay | Admitting: Internal Medicine

## 2019-08-15 NOTE — Progress Notes (Signed)
Cardiology Office Note   Date:  08/16/2019   ID:  [name] [name], DOB October 15, 1945, MRN [id number]  PCP:  [name] Ebbs, MD  Cardiologist:   [name] Carnes, MD   F/u of AV dz       History of Present Illness: [name] [name] is a 73 y.o. male with a history of AV dz (s/p AVR in 2007 ), CAD (s/p SVG to RCA)  dilated ascending aorta, COPD, hypertension, atherosclerosis   the patient also has a history of moderate LV dysfunction in the past.  I saw the pt in June 2018  She was last seen by [name] [name] in Feb 2020 echocardiogram after showed LVEF of 55 to 60%.  Aortic valve prosthesis had a peak and mean gradient of 30 and 16 mm respectively.  Ascending aorta was 46 mm.by echo  CT scan though shows that the aorta is 5.2 cm at the sinuses of Valsalva.  The patient denies chest pain he gets short of breath wearing a mask.  It is hard for him to breathe with the mask on.  No wheezing.  He denies swelling.  No palpitations.  Current Meds  Medication Sig  . acetaminophen (TYLENOL) 500 MG tablet Take 500 mg by mouth 2 (two) times daily as needed (pain.).  Marland Kitchen albuterol (PROVENTIL HFA;VENTOLIN HFA) 108 (90 Base) MCG/ACT inhaler Inhale 2 puffs into the lungs every 6 (six) hours as needed for wheezing or shortness of breath.  Marland Kitchen aspirin EC 81 MG tablet Take 1 tablet (81 mg total) daily by mouth.  Marland Kitchen atorvastatin (LIPITOR) 40 MG tablet TAKE 1 TABLET(40 MG) BY MOUTH DAILY AT 6 PM  . BETA CAROTENE PO Take 1 tablet by mouth 2 (two) times a week.  . budesonide-formoterol (SYMBICORT) 160-4.5 MCG/ACT inhaler Inhale 2 puffs into the lungs 2 (two) times daily.  Marland Kitchen dextromethorphan-guaiFENesin (MUCINEX DM) 30-600 MG per 12 hr tablet Take 1 tablet by mouth every 12 (twelve) hours as needed for cough (mucous build up).   . furosemide (LASIX) 40 MG tablet Take 40 mg by mouth every evening.   . Ipratropium-Albuterol (COMBIVENT RESPIMAT) 20-100 MCG/ACT AERS respimat Inhale 1 puff into the lungs every 6 (six)  hours as needed for wheezing or shortness of breath.  Marland Kitchen ipratropium-albuterol (DUONEB) 0.5-2.5 (3) MG/3ML SOLN Inhale 3 mLs into the lungs every 6 (six) hours as needed (wheezing/shortness of breath.).   Marland Kitchen lisinopril (PRINIVIL,ZESTRIL) 5 MG tablet Take 1.5 tablets (7.5 mg total) by mouth daily. (Patient taking differently: Take 5 mg by mouth daily. )  . Multiple Vitamin (MULTIVITAMIN WITH MINERALS) TABS tablet Take 1 tablet by mouth every other day. Alive  . potassium chloride (KLOR-CON) 10 MEQ CR tablet Take 10 mEq by mouth at bedtime.   Marland Kitchen tiZANidine (ZANAFLEX) 4 MG tablet Take 4 mg by mouth See admin instructions. Take 1 tablet (4 mg) by mouth scheduled at bedtime daily, may take an additional dose during the day if needed for spasms.  . Vitamin D, Ergocalciferol, (DRISDOL) 1.25 MG (50000 UT) CAPS capsule Take 5,000 Units by mouth every Monday.     Allergies:   Patient has no known allergies.   Past Medical History:  Diagnosis Date  . Aortic stenosis    s/p bioprosthetic valve replacement  . Ascending aortic aneurysm ([place])   . CAD (coronary artery disease) of bypass graft    s/p SVG to dRCA  . Cardiomyopathy 2006   EF 30%; has history of CHF  . CHF (congestive heart  failure) ([place])   . COPD (chronic obstructive pulmonary disease) (H[facility])    FEV1/FVC -> 0.52 (69% of predicted)  . Lesion of buccal mucosa    right, with non-restoable abscessed teeth  . Osteoarthritis   . Tobacco abuse     Past Surgical History:  Procedure Laterality Date  . AORTIC VALVE REPLACEMENT     bioprosthetic, 01/2006 - cannot find record of surgery (has scar)  . COLONOSCOPY N/A 01/01/2017   Procedure: COLONOSCOPY;  Surgeon: [name] Spates, MD;  Location: [facility] ENDOSCOPY;  Service: Endoscopy;  Laterality: N/A;  . ESOPHAGOGASTRODUODENOSCOPY N/A 01/01/2017   Procedure: ESOPHAGOGASTRODUODENOSCOPY (EGD);  Surgeon: [name] Spates, MD;  Location: [facility] ENDOSCOPY;  Service: Endoscopy;  Laterality: N/A;  . TONSILLECTOMY        Social History:  The patient  reports that he quit smoking about 3 years ago. His smoking use included cigarettes. He has a 50.00 pack-year smoking history. He has never used smokeless tobacco. He reports that he does not drink alcohol or use drugs.   Family History:  The patient's family history includes Cancer in his brother and mother; Coronary artery disease in his father; Heart failure in his father.    ROS:  Please see the history of present illness. All other systems are reviewed and  Negative to the above problem except as noted.    PHYSICAL EXAM: VS:  BP (!) 110/56   Pulse 79   Ht 5\' 6"  (1.676 m)   Wt 154 lb 12.8 oz (70.2 kg)   SpO2 97%   BMI 24.99 kg/m   GEN: Well nourished, well developed, in no acute distress  HEENT: normal  Neck: no JVD, carotid bruits, or masses Cardiac: RRR; Gr II/VI systolic murmur , rubs, or gallops,  Tr  edema  Respiratory: Decreased airflow bilaterally  GI: soft, nontender, nondistended, + BS  No hepatomegaly  MS: no deformity Moving all extremities   Skin: warm and dry, no rash Neuro:  Strength and sensation are intact Psych: euthymic mood, full affect   EKG:  EKG is not ordered    Lipid Panel    Component Value Date/Time   CHOL 143 08/18/2017 1057   TRIG 34 08/18/2017 1057   HDL 69 08/18/2017 1057   CHOLHDL 2.1 08/18/2017 1057   CHOLHDL 2.2 03/18/2016 1016   VLDL 10 03/18/2016 1016   LDLCALC 67 08/18/2017 1057      Wt Readings from Last 3 Encounters:  08/16/19 154 lb 12.8 oz (70.2 kg)  05/14/19 156 lb (70.8 kg)  03/22/19 156 lb (70.8 kg)      ASSESSMENT AND PLAN:  1  AV dz echo last spring shows the prosthesis functioning okay. 2 aortic aneurysm.  Follows with [employer]  Aorta is stable  3 hypertension good control  4 CAD asymptomatic take 81 mg aspirin   5 hyperlipidemia.  The patient says he just had labs with [name] [name].  Will try to get these labs, though they should be in epic will need to have him  come back to get if they are not drawn.  Continue Lipitor. 6  Dyspnea /COPD  Significant   Airflow is down   No wheezing  Needs to take extreme care with covid.    I will f/u in 6 months   Signed, [name] Carnes, MD  08/16/2019 10:44 AM    [address], [place], [place]  [address] Phone: [phone]; Fax: [phone]

## 2019-08-16 ENCOUNTER — Other Ambulatory Visit: Payer: Self-pay

## 2019-08-16 ENCOUNTER — Ambulatory Visit (INDEPENDENT_AMBULATORY_CARE_PROVIDER_SITE_OTHER): Payer: Medicare Other | Admitting: Internal Medicine

## 2019-08-16 VITALS — BP 110/56 | HR 79 | Ht 66.0 in | Wt 154.8 lb

## 2019-08-16 DIAGNOSIS — E782 Mixed hyperlipidemia: Secondary | ICD-10-CM | POA: Diagnosis not present

## 2019-08-16 DIAGNOSIS — I10 Essential (primary) hypertension: Secondary | ICD-10-CM | POA: Diagnosis not present

## 2019-08-16 DIAGNOSIS — I2510 Atherosclerotic heart disease of native coronary artery without angina pectoris: Secondary | ICD-10-CM

## 2019-08-16 NOTE — Patient Instructions (Signed)
Medication Instructions:  No changes *If you need a refill on your cardiac medications before your next appointment, please call your pharmacy*  Lab Work: Will review labs from Dr. Jeanie Cooks If you have labs (blood work) drawn today and your tests are completely normal, you will receive your results only by: Marland Kitchen MyChart Message (if you have MyChart) OR . A paper copy in the mail If you have any lab test that is abnormal or we need to change your treatment, we will call you to review the results.  Testing/Procedures: none  Follow-Up: At [facility], you and your health needs are our priority.  As part of our continuing mission to provide you with exceptional heart care, we have created designated Provider Care Teams.  These Care Teams include your primary Cardiologist (physician) and Advanced Practice Providers (APPs -  Physician Assistants and Nurse Practitioners) who all work together to provide you with the care you need, when you need it.  Your next appointment:   6 months  The format for your next appointment:   In Person  Provider:   Dorris Carnes, MD  Other Instructions

## 2019-08-23 ENCOUNTER — Other Ambulatory Visit: Payer: Self-pay

## 2019-08-23 ENCOUNTER — Ambulatory Visit (INDEPENDENT_AMBULATORY_CARE_PROVIDER_SITE_OTHER): Payer: Medicare Other | Admitting: Internal Medicine

## 2019-08-23 ENCOUNTER — Encounter: Payer: Self-pay | Admitting: Internal Medicine

## 2019-08-23 VITALS — BP 150/66 | HR 91 | Temp 97.5°F | Ht 66.0 in | Wt 158.8 lb

## 2019-08-23 DIAGNOSIS — J432 Centrilobular emphysema: Secondary | ICD-10-CM

## 2019-08-23 DIAGNOSIS — Z87891 Personal history of nicotine dependence: Secondary | ICD-10-CM

## 2019-08-23 MED ORDER — ALBUTEROL SULFATE HFA 108 (90 BASE) MCG/ACT IN AERS: 2.0000 | INHALATION_SPRAY | RESPIRATORY_TRACT | 11 refills | Status: DC | PRN

## 2019-08-23 NOTE — Patient Instructions (Addendum)
The patient should have follow up scheduled with myself in 2 months.   Prior to next visit patient should have an ABG before visit.  Wants a Friday morning appt.

## 2019-08-23 NOTE — Progress Notes (Signed)
[name] [name]    [id number]    [birth year]  Primary Care Physician:Avbuere, Christean Grief, MD  Referring Physician: Nolene Ebbs, [address] [place],  [place] [address] Reason for Consultation: shortness of breath Date of Consultation: 08/23/2019  Chief complaint:   Chief Complaint  Patient presents with  . Consult    shortness of breath recently since March while wearing mask can't breathe well, needs surgical clearance for dental surgery- all 9 teeth removed     HPI:  History of COPD.  He had pulmonary function testing when he saw Dr. Melvyn Novas in 2013.  Has been on symbicort since then,  Takes albuterol mdi 8 times/day. Takes combivent 2-3 times a day, and also has a DuoNeb nebulizer at home which he takes 4 times a day.  Has not tolerated Spiriva in the past.  Great on prednisone, feels worse in heat and humidity, and minimal exertion. History includes: AV dz (s/p AVR in 2007 ), CAD (s/p SVG to RCA)  dilated ascending aorta, COPD, hypertension, atherosclerosis, moderate LV dysfunction in the past  Here for cardiac clearance for general anesthesia with oral surgery for teeth extraction. Dr. Diona Browner is his test.  Wakes up at night feeling dry mouth. Has daytime sleepiness.   Hasn't been checked in 25 years for sleep apnea- was negative then. Has been given prednisone once his breathing.   OBSTRUCTIVE SLEEP APNEA SCREENING  1.  Snoring?:  yes 2.  Tired?:  yes 3.  Observed apnea, stop breathing or choking/gasping during sleep?:  yes 4.  Pressure. HTN history?  yes 5.  BMI more than 35 kg/m2?  no 6.  Age more than 23 yrs?  yes 7.  Neck size larger than 17 in for male or 16 in for male?  no 8.  Gender = Male?  yes  Total:  6  For general population  OSA - Low Risk : Yes to 0 - 2 questions OSA - Intermediate Risk : Yes to 3 - 4 questions OSA - High Risk : Yes to 5 - 8 questions  or Yes to 2 or more of 4 STOP questions + male gender or Yes to 2 or  more of 4 STOP questions + BMI > 35kg/m2  or Yes to 2 or more of 4 STOP questions + neck circumference 17 inches / 43cm in male or 16 inches / 41cm in male  References: Rinaldo Cloud al. Anesthesiology 2008; 108: 812-821,  Gabriel Cirri et al Br Dara Hoyer 2012; 108: [id number],  Gabriel Cirri et al J Clin Sleep Med Sept 2014.  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  History of etoh use disorder - over 40 years  Social History   Occupational History  . Occupation: Retired    Comment: Dealer  Tobacco Use  . Smoking status: Former Smoker    Packs/day: 3.00    Years: 59.00    Pack years: 177.00    Types: Cigarettes    Start date: 39    Quit date: 2017    Years since quitting: 3.8  . Smokeless tobacco: Never Used  Substance and Sexual Activity  . Alcohol use: No    Comment: states he quit many years ago  . 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 Sexual activity: Not on  file    Relevant family history:  Family History  Problem Relation Age of Onset  . Cancer Mother        uterine cancer  . Emphysema Mother   . Heart failure Father   . Coronary artery disease Father   . Emphysema Father   . Emphysema Sister   . Cancer Brother        neck/throat cancer  . Emphysema Brother     Past Medical History:  Diagnosis Date  . Aortic stenosis    s/p bioprosthetic valve replacement  . Ascending aortic aneurysm ([place])   . CAD (coronary artery disease) of bypass graft    s/p SVG to dRCA  . Cardiomyopathy 2006   EF 30%; has history of CHF  . CHF (congestive heart failure) ([place])   . COPD (chronic obstructive pulmonary disease) (HCC)    FEV1/FVC -> 0.52 (69% of predicted)  . Lesion of buccal mucosa    right, with non-restoable abscessed teeth  . Osteoarthritis   . Tobacco abuse     Past Surgical History:   Procedure Laterality Date  . AORTIC VALVE REPLACEMENT     bioprosthetic, 01/2006 - cannot find record of surgery (has scar)  . COLONOSCOPY N/A 01/01/2017   Procedure: COLONOSCOPY;  Surgeon: Laurence Spates, MD;  Location: [facility] ENDOSCOPY;  Service: Endoscopy;  Laterality: N/A;  . ESOPHAGOGASTRODUODENOSCOPY N/A 01/01/2017   Procedure: ESOPHAGOGASTRODUODENOSCOPY (EGD);  Surgeon: Laurence Spates, MD;  Location: [facility] END[facility]COPY;  Service: Endoscopy;  Laterality: N/A;  . TONSILLECTOMY       Review of systems: Review of Systems  Constitutional: Negative for chills, fever and weight loss.  HENT: Negative for congestion, sinus pain and sore throat.        Dry mouth  Eyes: Negative for discharge and redness.  Respiratory: Positive for shortness of breath. Negative for cough, hemoptysis, sputum production and wheezing.   Cardiovascular: Negative for chest pain, palpitations and leg swelling.  Gastrointestinal: Negative for heartburn, nausea and vomiting.  Musculoskeletal: Negative for joint pain and myalgias.  Skin: Negative for rash.  Neurological: Negative for dizziness, tremors, focal weakness and headaches.  Endo/Heme/Allergies: Negative for environmental allergies.  Psychiatric/Behavioral: Negative for depression. The patient is not nervous/anxious.   All other systems reviewed and are negative.   Physical Exam: Blood pressure (!) 150/66, pulse 91, temperature (!) 97.5 F (36.4 C), temperature source Temporal, height 5\' 6"  (1.676 m), weight 158 lb 12.8 oz (72 kg), SpO2 96 %. Gen:      No acute distress Eyes: EOMI, sclera anicteric ENT:  no nasal polyps, mucus membranes moist Neck:     Supple, no thyromegaly Lungs:    Increased respiratory rate, no wheezes or crackles, lungs severely diminished. CV:         Regular rate and rhythm; no murmurs, rubs, or gallops.  No pedal edema Abd:      + bowel sounds; soft, non-tender; no distension MSK: no acute synovitis of DIP or PIP joints, no mechanics  hands.  Skin:      Warm and dry; no rashes Neuro: normal speech, no focal facial asymmetry Psych: alert and oriented x3, normal mood and affect  Data Reviewed: Imaging: I have personally reviewed the CT angio from August 2020.  Notably there is severe centrilobular emphysema throughout both lung fields upper and lower distribution has homogenous.  PFTs:  Pulmonary function testing in 2013 which demonstrates very severe airflow limitation with an FEV1 of 33% and moderately reduced DLCO consistent with emphysema.  Labs:  Immunization status: Immunization History  Administered Date(s) Administered  . Fluad Quad(high Dose 65+) 05/29/2019  . Pneumococcal Conjugate-13 12/05/2018  . Zoster Recombinat (Shingrix) 02/09/2018, 12/05/2018   Assessment:  Severe centrilobular emphysema FEV1 less 35% 155-pack-year smoking history, quit 2013 Status post aortic valve replacement Coronary artery disease status post CABG  Plan/Recommendations: An ambulatory desaturation study today which showed desaturation without hypoxemia.  Will obtain an ABG on room air to determine if there is chronic hypercapnia.  Hopefully we will be able to start him on noninvasive ventilation to improve his symptoms. I discussed with him the possibility of pulmonary rehab, he does not have a ride so right now. He is very comfortable on his current inhaler regimen, and would like to continue his Symbicort with as needed ipratropium as well as as needed albuterol  Return to Care: Return in about 2 months (around 10/23/2019). will do pre-op eval after ABG.  Lenice Llamas, MD Pulmonary and [place]  CC: Nolene Ebbs, MD

## 2019-10-02 ENCOUNTER — Other Ambulatory Visit: Payer: Self-pay

## 2019-10-02 MED ORDER — ATORVASTATIN CALCIUM 40 MG PO TABS: 40.0000 mg | ORAL_TABLET | Freq: Every day | ORAL | 0 refills | Status: DC

## 2019-10-15 ENCOUNTER — Other Ambulatory Visit: Payer: Self-pay | Admitting: Thoracic Surgery (Cardiothoracic Vascular Surgery)

## 2019-10-15 DIAGNOSIS — I712 Thoracic aortic aneurysm, without rupture, unspecified: Secondary | ICD-10-CM

## 2019-10-18 ENCOUNTER — Ambulatory Visit (INDEPENDENT_AMBULATORY_CARE_PROVIDER_SITE_OTHER): Payer: Medicare Other | Admitting: Internal Medicine

## 2019-10-18 ENCOUNTER — Other Ambulatory Visit: Payer: Self-pay

## 2019-10-18 ENCOUNTER — Encounter: Payer: Self-pay | Admitting: Internal Medicine

## 2019-10-18 VITALS — BP 126/62 | HR 86 | Temp 97.7°F | Ht 66.0 in | Wt 153.2 lb

## 2019-10-18 DIAGNOSIS — J432 Centrilobular emphysema: Secondary | ICD-10-CM | POA: Diagnosis not present

## 2019-10-18 NOTE — Progress Notes (Addendum)
[name] [name]    [id number]    [birth year]  Primary Care Physician:Avbuere, Christean Grief, MD Date of Appointment: 10/18/2019 Established Patient Visit  Chief complaint:   Chief Complaint  Patient presents with  . Follow-up    2 month follow up emphysema.  pt state he is stable; sob with exertion, pnd, nonprod cough.      HPI: History of COPD.  He had pulmonary function testing when he saw Dr. Melvyn Novas in 2013.  Has been on symbicort since then,  Takes albuterol mdi 8 times/day. Takes combivent 2-3 times a day, and also has a DuoNeb nebulizer at home which he takes 4 times a day.  Has not tolerated Spiriva in the past.  History includes: AV dz (s/p AVR in 2007 ),CAD (s/p SVG to RCA)dilated ascending aorta, COPD, hypertension, atherosclerosis, moderate LV dysfunction in the past   Interval Updates: Oxygen drops to the 70s when he is pushing a cart at the grocery store. He also gets leg pain when this happens.   I have reviewed the patient's family social and past medical history and updated as appropriate.   Past Medical History:  Diagnosis Date  . Aortic stenosis    s/p bioprosthetic valve replacement  . Ascending aortic aneurysm ([place])   . CAD (coronary artery disease) of bypass graft    s/p SVG to dRCA  . Cardiomyopathy 2006   EF 30%; has history of CHF  . CHF (congestive heart failure) ([place])   . COPD (chronic obstructive pulmonary disease) ([facility])    FEV1/FVC -> 0.52 (69% of predicted)  . Lesion of buccal mucosa    right, with non-restoable abscessed teeth  . Osteoarthritis   . Tobacco abuse     Past Surgical History:  Procedure Laterality Date  . AORTIC VALVE REPLACEMENT     bioprosthetic, 01/2006 - cannot find record of surgery (has scar)  . COLONOSCOPY N/A 01/01/2017   Procedure: COLONOSCOPY;  Surgeon: Laurence Spates, MD;  Location: [facility] ENDOSCOPY;  Service: Endoscopy;  Laterality: N/A;  . ESOPHAGOGASTRODUODENOSCOPY N/A 01/01/2017   Procedure:  ESOPHAGOGASTRODUODENOSCOPY (EGD);  Surgeon: Laurence Spates, MD;  Location: [facility] ENDOSCOPY;  Service: Endoscopy;  Laterality: N/A;  . TONSILLECTOMY      Family History  Problem Relation Age of Onset  . Cancer Mother        uterine cancer  . Emphysema Mother   . Heart failure Father   . Coronary artery disease Father   . Emphysema Father   . Emphysema Sister   . Cancer Brother        neck/throat cancer  . Emphysema Brother     Social History   Occupational History  . Occupation: Retired    Comment: Dealer  Tobacco Use  . Smoking status: Former Smoker    Packs/day: 3.00    Years: 59.00    Pack years: 177.00    Types: Cigarettes    Start date: 48    Quit date: 2017    Years since quitting: 4.0  . Smokeless tobacco: Never Used  Substance and Sexual Activity  . Alcohol use: No    Comment: states he quit many years ago  . 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 Sexual activity: Not on file    Review of systems: Constitutional: No fevers, chills, night sweats, or weight loss. CV: +claudication, no chest pain Resp: No hemoptysis.  Physical Exam: Blood pressure 126/62, pulse  86, temperature 97.7 F (36.5 C), temperature source Temporal, height 5\' 6"  (1.676 m), weight 153 lb 3.2 oz (69.5 kg), SpO2 98 %.  Gen:      No acute distress, appears tachypnic with conversation at rest.  Lungs:    Short of breath at rest, diminished breath sounds bilaterally CV:         Regular rate and rhythm; no murmurs, rubs, or gallops.  No pedal edema Skin:      Warm and dry; no rashes  Data Reviewed: Imaging: I have personally reviewed the CT angio from August 2020.  Notably there is severe centrilobular emphysema throughout both lung fields upper and lower distribution has homogenous.  PFTs:  Pulmonary function testing in 2013 which demonstrates very severe airflow limitation with an FEV1 of 33% and moderately reduced DLCO consistent with  emphysema.  Immunization status: Immunization History  Administered Date(s) Administered  . Fluad Quad(high Dose 65+) 05/29/2019  . Pneumococcal Conjugate-13 12/05/2018  . Zoster Recombinat (Shingrix) 02/09/2018, 12/05/2018   up to date and documented.up to date and documented.  Assessment:  Severe centrilobular emphysema FEV1 less 35% 155-pack-year smoking history, quit 2013 Status post aortic valve replacement Coronary artery disease status post CABG  Plan/Recommendations: [name] again reports being very short of breath at home, but he is able to complete his ADLs.  Given his report of desaturation at home, we performed another ambulatory desaturation study today which showed desaturation without hypoxemia.  Based on his spirometry will start him on noninvasive positive pressure ventilation.  Patient has severe COPD. Volume Ventilation with Trilogy is indicated.  Ordering Trilogy with AVAPS-AE for use during hours of sleep as well as during waking hours when symptomatic.  Home BIPAP is NOT appropriate for meeting this patient's ventilatory requirements and does not work for this patient. Ordering Trilogy NIV with AVAPS-AE as opposed to BIPAP with Backup Rate and AVAPS (BIPAP- AVAPS) for the reasons below: . With BIPAP- AVAPS flow capability is extremely limited (1/3 of what the Trilogy is capable of achieving). . Trilogy with AVAPS-AE is preferred due to Auto Adjusting EPAP function allowing for continued upper airway patency throughout use. . Trilogy with AVAPS-AE has dual prescription modality allowing for seamless transition between modes when changes in the patient's clinical condition requires. . Trilogy is equipped with a backup battery allowing for continued use with power outages.  I discussed with him the possibility of pulmonary rehab, he does not have a ride so right now.  Continue his current inhaler regimen.  We will also do an overnight oximetry to ensure he is not  having desaturation with sleep.  I spent 32 minutes on 10/18/2019 in care of this patient including face to face time and non-face to face time spent charting, review of outside records, and coordination of care.   Return to Care: Return in about 3 months (around 01/16/2020).   Lenice Llamas, MD Pulmonary and [place]

## 2019-10-18 NOTE — Patient Instructions (Signed)
The patient should have follow up scheduled with myself in 3 months.   

## 2019-10-18 NOTE — Addendum Note (Signed)
Addended by: Len Blalock on: 10/18/2019 01:44 PM   Modules accepted: Orders

## 2019-11-21 ENCOUNTER — Other Ambulatory Visit: Payer: Medicare Other

## 2019-11-26 ENCOUNTER — Ambulatory Visit: Payer: Medicare Other | Admitting: Thoracic Surgery (Cardiothoracic Vascular Surgery)

## 2019-11-29 ENCOUNTER — Ambulatory Visit
Admission: RE | Admit: 2019-11-29 | Discharge: 2019-11-29 | Disposition: A | Discharge status: Home / Self Care | Payer: Medicare Other | Source: Ambulatory Visit | Attending: Thoracic Surgery (Cardiothoracic Vascular Surgery) | Admitting: Thoracic Surgery (Cardiothoracic Vascular Surgery)

## 2019-11-29 ENCOUNTER — Other Ambulatory Visit: Payer: Self-pay

## 2019-11-29 DIAGNOSIS — I712 Thoracic aortic aneurysm, without rupture, unspecified: Secondary | ICD-10-CM

## 2019-11-29 MED ORDER — IOPAMIDOL (ISOVUE-370) INJECTION 76%
75.0000 mL | Freq: Once | INTRAVENOUS | Status: AC | PRN
  Administered 2019-11-29: 75 mL via INTRAVENOUS

## 2019-12-02 ENCOUNTER — Telehealth: Payer: Self-pay | Admitting: Internal Medicine

## 2019-12-02 NOTE — Telephone Encounter (Signed)
Noted.  Happy to help.  Wyn Quaker, FNP

## 2019-12-02 NOTE — Telephone Encounter (Signed)
Per office protocol, a peer to peer needs to be scheduled on provider schedule in a 30 minute block to be completed.  Can be completed by a NP and not necessarily the MD.   Since ND is not in office tomorrow, I have blocked time slot from 2:00 - 2:30 on Aaron Edelman Mack's schedule to complete this tomorrow.   Will route to [place] as [place].    Left a detailed message to Melissa at [facility] her aware that this was being completed.

## 2019-12-03 NOTE — Telephone Encounter (Signed)
Did not receive telephone call from insurance agency during the scheduled time slot.  Triage,  Please follow-up regarding this and reschedule a peer to peer to further review.  Wyn Quaker, FNP

## 2019-12-03 NOTE — Telephone Encounter (Signed)
12/03/2019  Called Melissa DME to discuss situation.  Updated her that peer-to-peer did not get completed today received no telephone call from the insurance agency as previously stated.  We will send back to triage to reschedule the peer to peer  Lenna Sciara believes the patient was started on a trilogy vent -NIV based off of 2013 spirometry.  ABG was discussed at November/2020 consult visit with Dr. Shearon Stalls but this was not completed  Stop bang score was 6 in November/2020, no sleep study on file.  will document here just in case a different APP is completing the peer-to-peer.  We will route to Dr. Shearon Stalls as Juluis Rainier the patient is still struggling with obtaining an IV based off of insurance coverage.  Unsure why [place] healthcare has denied the coverage.  Wyn Quaker, FNP

## 2019-12-03 NOTE — Telephone Encounter (Signed)
Thank you - [employer] is notorious for getting trilogy vent difficult to approve. If bipap is easier - we should go that route. Would help prevent readmissions. Should we order that instead?

## 2019-12-04 NOTE — Telephone Encounter (Signed)
Peer to peer needs to be added to the provider schedule on 12/05/19 at 11:30.  Nothing further needed at this time.

## 2019-12-05 ENCOUNTER — Ambulatory Visit: Payer: Medicare Other | Admitting: Pulmonary Disease

## 2019-12-05 NOTE — Telephone Encounter (Signed)
12/05/2019  Triage,  Please contact the insurance company, specifically schedule a peer to peer.  Please place an open slot.  Simply scheduling the patient on my schedule is not sufficient.  The insurance company needs to be available at that time as well as contacted our office.  Tentative point of contact for telephone number ideally will be [phone].  They can assess speak with Aaron Edelman and he can be transferred to me to ensure the call is not missed.  Wyn Quaker, FNP

## 2019-12-05 NOTE — Telephone Encounter (Addendum)
Called [employer] at [phone], option #4 to schedule the peer to peer. Was on hold for over 15 minutes. Will try again later. Since the message has been closed, will leave in triage.

## 2019-12-06 NOTE — Telephone Encounter (Signed)
This is very frustrating. Why don't we order a BIPAP for him instead.

## 2019-12-06 NOTE — Telephone Encounter (Signed)
So we will need to then submit for an appeal or discuss this with Dr. Shearon Stalls.  I have cced her on this message. Wyn Quaker, FNP

## 2019-12-06 NOTE — Telephone Encounter (Signed)
[name] [name],Discussed this with Dr. Shearon Stalls.  We believe this patient may benefit from a referral to [facility] for an RT visit for symptom management as well as follow-up.We are open to their suggestions as far as for BiPAP settings.  If not Dr. Mauricio Po willing to order a Pap with the settings of 12/5  Let me know your thoughts to see if we can get him set up with home health company.Patient also likely would benefit from an office visit if we can coordinate that.Routing to use Dr. Shearon Stalls who reported that he just had a meeting with his home health agency as well as for point of contact.[name] Quaker, FNP

## 2019-12-06 NOTE — Telephone Encounter (Signed)
I received the following email from [facility] in regards to doing the P2P:   "Hello,  Thank you for submitting your Peer to Peer request  Please see the details regarding your Peer to Peer discussion  Service Reference Number: [id number]  Member's Name: Tristion Vint  Provider/MD's Name: Wyn Quaker  Please see below for info this was the most recent denial for this member, but with this being a Medicare denial only way to overturn decision would be to do an appeal.     "I see in the system that, for this Medicare patient, the opportunity to reverse the decision through a P2P discussion has closed or is not eligible. That's due to a CMS directive that does not allow for reversal after an Adverse Determination has been issued. In order to submit more Clinical information and possibly have the decision reversed, you'll have to file an Appeal.""  The above information has been sent to [facility] via a staff message.

## 2019-12-06 NOTE — Telephone Encounter (Signed)
Called [employer] again this morning. Stayed on hold for over 10 minutes. Found a link online requesting a peer to peer. Will submit the information online to see if we can get a response faster.

## 2019-12-10 ENCOUNTER — Encounter: Payer: Self-pay | Admitting: Thoracic Surgery (Cardiothoracic Vascular Surgery)

## 2019-12-10 ENCOUNTER — Ambulatory Visit (INDEPENDENT_AMBULATORY_CARE_PROVIDER_SITE_OTHER): Payer: Medicare Other | Admitting: Thoracic Surgery (Cardiothoracic Vascular Surgery)

## 2019-12-10 ENCOUNTER — Other Ambulatory Visit: Payer: Self-pay

## 2019-12-10 VITALS — BP 143/71 | HR 76 | Temp 97.7°F | Resp 16 | Ht 66.0 in | Wt 152.0 lb

## 2019-12-10 DIAGNOSIS — I712 Thoracic aortic aneurysm, without rupture, unspecified: Secondary | ICD-10-CM

## 2019-12-10 NOTE — Progress Notes (Signed)
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  As noted he had an AVR with bioprosthetic valve in 2007.  He was found to have an aortic root aneurysm on an echocardiogram in 2016.  On CT the aortic root was measured at 5.2 cm.  His aortic valve had some calcification and a mean gradient of 16 with a peak gradient of 29 mmHg on an echocardiogram in February 2020.  He complains of shortness of breath with exertion.  He is not having any chest pain, pressure, tightness.  His shortness of breath is related to his emphysema.  His insurance company has been balking at approving portable oxygen.  Past Medical History:  Diagnosis Date  . Aortic stenosis    s/p bioprosthetic valve replacement  . Ascending aortic aneurysm ([place])   . CAD (coronary artery disease) of bypass graft    s/p SVG to dRCA  . Cardiomyopathy 2006   EF 30%; has history of CHF  . CHF (congestive heart failure) ([place])   . COPD (chronic obstructive pulmonary disease) (H[facility])    FEV1/FVC -> 0.52 (69% of predicted)  . Lesion of buccal mucosa    right, with non-restoable abscessed teeth  . Osteoarthritis   . Tobacco abuse     Current Outpatient Medications  Medication Sig Dispense Refill  . acetaminophen (TYLENOL) 500 MG tablet Take 500 mg by mouth 2 (two) times daily as needed (pain.).    Marland Kitchen albuterol (VENTOLIN HFA) 108 (90 Base) MCG/ACT inhaler Inhale 2 puffs into the lungs every 4 (four) hours as needed for wheezing or shortness of breath. Three month supply. 18 g 11  . aspirin EC 81 MG tablet Take 1 tablet (81 mg total) daily by mouth. 90 tablet 3  . atorvastatin (LIPITOR) 40 MG tablet TAKE  1 TABLET(40 MG) BY MOUTH DAILY AT 6 PM 90 tablet 1  . BETA CAROTENE PO Take 1 tablet by mouth 2 (two) times a week.    . budesonide-formoterol (SYMBICORT) 160-4.5 MCG/ACT inhaler Inhale 2 puffs into the lungs 2 (two) times daily.    Marland Kitchen dextromethorphan-guaiFENesin (MUCINEX DM) 30-600 MG per 12 hr tablet Take 1 tablet by mouth every 12 (twelve) hours as needed for cough (mucous build up).     . furosemide (LASIX) 40 MG tablet Take 40 mg by mouth every evening.     . Ipratropium-Albuterol (COMBIVENT RESPIMAT) 20-100 MCG/ACT AERS respimat Inhale 1 puff into the lungs every 6 (six) hours as needed for wheezing or shortness of breath.    Marland Kitchen ipratropium-albuterol (DUONEB) 0.5-2.5 (3) MG/3ML SOLN Inhale 3 mLs into the lungs every 6 (six) hours as needed (wheezing/shortness of breath.).     Marland Kitchen lisinopril (PRINIVIL,ZESTRIL) 5 MG tablet Take 1.5 tablets (7.5 mg total) by mouth daily. (Patient taking differently: Take 5 mg by mouth daily. ) 45 tablet 2  . Multiple Vitamin (MULTIVITAMIN WITH MINERALS) TABS tablet Take 1 tablet by mouth every other day. Alive    . potassium chloride (KLOR-CON) 10 MEQ CR tablet Take 10 mEq by mouth at bedtime.     Marland Kitchen  tiZANidine (ZANAFLEX) 4 MG tablet Take 4 mg by mouth See admin instructions. Take 1 tablet (4 mg) by mouth scheduled at bedtime daily, may take an additional dose during the day if needed for spasms.  2  . Vitamin D, Ergocalciferol, (DRISDOL) 1.25 MG (50000 UT) CAPS capsule Take 5,000 Units by mouth every Monday.     No current facility-administered medications for this visit.    Physical Exam BP (!) 143/71 (BP Location: Left Arm, Patient Position: Sitting, Cuff Size: Normal)   Pulse 76   Temp 97.7 F (36.5 C)   Resp 16   Ht 5\' 6"  (1.676 m)   Wt 152 lb (68.9 kg)   SpO2 96% Comment: RA  BMI 24.58 kg/m  74 year old man in no acute distress Alert and oriented x3 with no focal deficit Cardiac regular rate and rhythm with a 2/6 systolic murmur Lungs diminished  but equal bilaterally, no wheezing  Diagnostic Tests: CT ANGIOGRAPHY CHEST WITH CONTRAST  TECHNIQUE: Multidetector CT imaging of the chest was performed using the standard protocol during bolus administration of intravenous contrast. Multiplanar CT image reconstructions and MIPs were obtained to evaluate the vascular anatomy.  CONTRAST:  83mL ISOVUE-370 IOPAMIDOL (ISOVUE-370) INJECTION 76%  COMPARISON:  05/09/2019  FINDINGS: Cardiovascular: The ascending thoracic aorta coronally measures unchanged at approximately 5.1 cm at the level of the sinuses of Valsalva. The aortic arch and descending thoracic aorta are similarly unchanged. Calcified plaque is again noted along the thoracic aorta. There is no central pulmonary embolism. Heart size is normal. Evidence of aortic valve replacement and CABG.  Mediastinum/Nodes: No new adenopathy.  Lungs/Pleura: Emphysema.  No new mass or consolidation.  Upper Abdomen: No acute abnormality.  Musculoskeletal: No acute abnormality.  Review of the MIP images confirms the above findings.  IMPRESSION: Stable ascending thoracic aorta aneurysm. Ascending thoracic aortic aneurysm. Recommend semi-annual imaging followup by CTA or MRA and referral to cardiothoracic surgery if not already obtained. This recommendation follows 2010 ACCF/AHA/AATS/ACR/ASA/SCA/SCAI/SIR/STS/SVM Guidelines for the Diagnosis and Management of Patients With Thoracic Aortic Disease. Circulation. 2010; [id number]. Aortic aneurysm NOS (ICD10-I71.9)  Emphysema.  No new findings.   Electronically Signed   By: Macy Mis M.D.   On: 11/29/2019 13:49 I personally reviewed the CT images and concur with the findings noted above  Impression: [name] [name] is a 74 year old man with a history of heavy tobacco abuse (quit 2017), COPD, aortic valve replacement for a stenotic bicuspid valve in 2007, hypertension, back pain, GI bleeding, and an aortic root  aneurysm.  Prosthetic aortic valve-last echo was about a year ago.  Good function of the valve although some calcification present.  Aortic root aneurysm-stable dating back to about 2016.  His blood pressure is slightly elevated today but only just slightly above normal range.  He will monitor that.  Coronary artery disease-no anginal symptoms at present  COPD-trying to get approval for home oxygen.  Pulmonary is working on that.  Plan: Return in 6 months with CT angiogram of chest  Melrose Nakayama, MD [facility] [phone]

## 2019-12-13 NOTE — Telephone Encounter (Signed)
I have reached out to [facility], once I hear back I will update the team and call the patient.

## 2019-12-25 NOTE — Telephone Encounter (Signed)
Did we ever get this guy enrolled with [facility]?

## 2019-12-27 NOTE — Telephone Encounter (Signed)
Message has been sent to rep from [facility],

## 2020-01-30 ENCOUNTER — Other Ambulatory Visit: Payer: Self-pay | Admitting: Internal Medicine

## 2020-02-10 ENCOUNTER — Other Ambulatory Visit: Payer: Self-pay

## 2020-02-10 ENCOUNTER — Encounter: Payer: Self-pay | Admitting: Internal Medicine

## 2020-02-10 ENCOUNTER — Telehealth: Payer: Self-pay | Admitting: Internal Medicine

## 2020-02-10 ENCOUNTER — Ambulatory Visit (INDEPENDENT_AMBULATORY_CARE_PROVIDER_SITE_OTHER): Payer: Medicare Other | Admitting: Internal Medicine

## 2020-02-10 ENCOUNTER — Ambulatory Visit: Payer: Medicare Other | Admitting: Internal Medicine

## 2020-02-10 VITALS — BP 140/70 | HR 87 | Temp 98.0°F | Wt 156.8 lb

## 2020-02-10 DIAGNOSIS — J410 Simple chronic bronchitis: Secondary | ICD-10-CM

## 2020-02-10 DIAGNOSIS — J432 Centrilobular emphysema: Secondary | ICD-10-CM | POA: Diagnosis not present

## 2020-02-10 NOTE — Patient Instructions (Addendum)
The patient should have follow up scheduled with APP in 3 months.  Can follow up sooner if needed for surgical clearance visit for dental surgery.  Will follow up with me after my return in October.   Have ordered home inogen.

## 2020-02-10 NOTE — Progress Notes (Signed)
[name] [name]    [id number]    [birth year]  Primary Care Physician:Avbuere, Christean Grief, MD Date of Appointment: 02/10/2020 Established Patient Visit  Chief complaint:   Chief Complaint  Patient presents with  . Follow-up    sob with exertion especially with mask on, gets dizzy with mask on.    HPI: History of COPD.  He had pulmonary function testing when he saw Dr. Melvyn Novas in 2013.  Has been on symbicort since then,  Takes albuterol mdi 8 times/day. Takes combivent 2-3 times a day, and also has a DuoNeb nebulizer at home which he takes 4 times a day.  Has not tolerated Spiriva in the past.  History includes: AV dz (s/p AVR in 2007 ),CAD (s/p SVG to RCA)dilated ascending aorta, COPD, hypertension, atherosclerosis, moderate LV dysfunction in the past   Interval Updates: No interval hospitalizations or ED visits.  He used to have home oxygen but had it taken away. Asking if he can have a portable oxygen concentrator.  Had bipap brought by [employer] but he did not tolerate. He sent it back. Nocturnal breathing improved with buying a humidifier.  Tolerating inhaler therapy.   I have reviewed the patient's family social and past medical history and updated as appropriate.   Past Medical History:  Diagnosis Date  . Aortic stenosis    s/p bioprosthetic valve replacement  . Ascending aortic aneurysm ([place])   . CAD (coronary artery disease) of bypass graft    s/p SVG to dRCA  . Cardiomyopathy 2006   EF 30%; has history of CHF  . CHF (congestive heart failure) ([place])   . COPD (chronic obstructive pulmonary disease) ([facility])    FEV1/FVC -> 0.52 (69% of predicted)  . Lesion of buccal mucosa    right, with non-restoable abscessed teeth  . Osteoarthritis   . Tobacco abuse     Past Surgical History:  Procedure Laterality Date  . AORTIC VALVE REPLACEMENT     bioprosthetic, 01/2006 - cannot find record of surgery (has scar)  . COLONOSCOPY N/A 01/01/2017   Procedure:  COLONOSCOPY;  Surgeon: Laurence Spates, MD;  [facility] END[facility]COPY;  Service: Endoscopy;  Laterality: N/A;  . ESOPHAGOGASTRODUODENOSCOPY N/A 01/01/2017   Procedure: ESOPHAGOGASTRODUODENOSCOPY (EGD);  Surgeon: Laurence Spates, MD;  Location[facility] ENDOSCOPY;  Service: Endoscopy;  Laterality: N/A;  . TONSILLECTOMY      Family History  Problem Relation Age of Onset  . Cancer Mother        uterine cancer  . Emphysema Mother   . Heart failure Father   . Coronary artery disease Father   . Emphysema Father   . Emphysema Sister   . Cancer Brother        neck/throat cancer  . Emphysema Brother     Social History   Occupational History  . Occupation: Retired    Comment: Dealer  Tobacco Use  . Smoking status: Former Smoker    Packs/day: 3.00    Years: 59.00    Pack years: 177.00    Types: Cigarettes    Start date: 27    Quit date: 2017    Years since quitting: 4.3  . Smokeless tobacco: Never Used  Substance and Sexual Activity  . Alcohol use: No    Comment: states he quit many years ago  . 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 Sexual activity: Not on file    Review of systems: Constitutional:  No fevers, chills, night sweats, or weight loss. CV: no chest pain Resp: No hemoptysis.  Physical Exam: Blood pressure 140/70, pulse 87, temperature 98 F (36.7 C), temperature source Temporal, weight 156 lb 12.8 oz (71.1 kg), SpO2 95 %.  Gen:      No acute distress, appears tachypnic with conversation at rest. Tripod position.  Lungs:    Short of breath at rest, diminished breath sounds bilaterally CV:         Regular rate and rhythm; no murmurs, rubs, or gallops.  No pedal edema Skin:      Warm and dry; no rashes  Data Reviewed: Imaging: I have personally reviewed the CT angio from August 2020.  Notably there is severe centrilobular emphysema throughout both lung fields upper and lower distribution has homogenous.  PFTs:  Pulmonary function  testing in 2013 which demonstrates very severe airflow limitation with an FEV1 of 33% and moderately reduced DLCO consistent with emphysema.  Immunization status: Immunization History  Administered Date(s) Administered  . Fluad Quad(high Dose 65+) 05/29/2019  . Moderna SARS-COVID-2 Vaccination 11/28/2019, 12/26/2019  . Pneumococcal Conjugate-13 12/05/2018  . Zoster Recombinat (Shingrix) 02/09/2018, 12/05/2018    Assessment:  Severe centrilobular emphysema FEV1 less 35% 155-pack-year smoking history, quit 2013 Status post aortic valve replacement Coronary artery disease status post CABG  Plan/Recommendations: Progression of disease with high risk for respiratory failure, hospitalization and death. Did not tolerate home bipap.  Will ambulate for home oxygen therapy today. He would like Inogen POC. He does qualify for home oxygen. Continue albuterol and symbicort.   At the end of his visit he let me know he is thinking about having dental surgery done.  He doesn't have the name of his dentist or information about the surgery today. He will return for preoperative clearance visit. Having some teeth pulled.   Return to Care: Return in about 3 months (around 05/12/2020).  Lenice Llamas, MD [facility] [place]

## 2020-02-10 NOTE — Telephone Encounter (Signed)
[name] [name], [place]; [name] [name], Leah   Gloriajean Dell,   According to the chart notes the patient used to have oxygen with someone but had it taken away. He use to have a BIPAP with [place] but sent that back as well because he could not tolerate. The only thing we currently have renting for him is a Vent. If the patient wants to get set up on oxygen again he has to 1st qualify as a new patient. So he needs to be retested by doing a 6 minute walk test, re-seen in office by the MD and then we need all new documents. We currently have a waiting list for POC's and are unable to get any in. Mr. Garbett would have to get set up on a concentrator and portable tanks in the meantime.   Thanks!

## 2020-02-12 NOTE — Telephone Encounter (Signed)
Spoke to [place] with [employer] they contacted the patient yesterday and his oxygen will be delivered today at patients request.

## 2020-02-19 ENCOUNTER — Other Ambulatory Visit: Payer: Self-pay | Admitting: Internal Medicine

## 2020-02-19 NOTE — Telephone Encounter (Signed)
Pt requesting refill for this medication, would Dr Harrington Challenger be willing to fill it?

## 2020-02-20 NOTE — Telephone Encounter (Signed)
This chart indicates patient has not been seen in cardiology but it also indicated the "patient marked for merge"    In the associated chart, it would appear he had an office visit in November 2020 and is due for follow up and lipids.  Will send a message to medical records to follow up on merge.

## 2020-02-28 ENCOUNTER — Encounter: Payer: Self-pay | Admitting: Internal Medicine

## 2020-02-28 ENCOUNTER — Other Ambulatory Visit: Payer: Self-pay

## 2020-02-28 ENCOUNTER — Ambulatory Visit (INDEPENDENT_AMBULATORY_CARE_PROVIDER_SITE_OTHER): Payer: Medicare Other | Admitting: Internal Medicine

## 2020-02-28 VITALS — BP 152/86 | HR 74 | Ht 66.0 in | Wt 156.0 lb

## 2020-02-28 DIAGNOSIS — I10 Essential (primary) hypertension: Secondary | ICD-10-CM

## 2020-02-28 NOTE — Progress Notes (Signed)
Cardiology Office Note   Date:  02/28/2020   ID:  [name] [name], [name] [name] 12/28/1945, MRN [id number]  PCP:  Nolene Ebbs, MD  Cardiologist:   Dorris Carnes, MD   F/u of AV dz       History of Present Illness: [name] [name] is a 74 y.o. male with a history of AV dz (s/p AVR in 2007 ), CAD (s/p SVG to RCA)  dilated ascending aorta, COPD, hypertension, atherosclerosis   the patient also has a history of moderate LV dysfunction in the past. She was last seen by B Bhagat in Feb 2020 echocardiogram after showed LVEF of 55 to 60%.  Aortic valve prosthesis had a peak and mean gradient of 30 and 16 mm respectively.  Ascending aorta was 46 mm.by echo  CT scan though shows that the aorta is 5.2 cm at the sinuses of Valsalva.  I saw the pt in Nov 2020  He has been seen by [name] in March 2021    SInce seen he denies CP   Does note some dizziness.   BP 100s to 120s /   NO syncope      Current Meds  Medication Sig  . acetaminophen (TYLENOL) 500 MG tablet Take 500 mg by mouth 2 (two) times daily as needed (pain.).  Marland Kitchen albuterol (VENTOLIN HFA) 108 (90 Base) MCG/ACT inhaler Inhale 2 puffs into the lungs every 4 (four) hours as needed for wheezing or shortness of breath. Three month supply.  Marland Kitchen aspirin EC 81 MG tablet Take 1 tablet (81 mg total) daily by mouth.  Marland Kitchen atorvastatin (LIPITOR) 40 MG tablet TAKE 1 TABLET(40 MG) BY MOUTH DAILY AT 6 PM  . BETA CAROTENE PO Take 1 tablet by mouth 2 (two) times a week.  . budesonide-formoterol (SYMBICORT) 160-4.5 MCG/ACT inhaler Inhale 2 puffs into the lungs 2 (two) times daily.  Marland Kitchen dextromethorphan-guaiFENesin (MUCINEX DM) 30-600 MG per 12 hr tablet Take 1 tablet by mouth every 12 (twelve) hours as needed for cough (mucous build up).   . furosemide (LASIX) 40 MG tablet Take 40 mg by mouth every evening.   . Ipratropium-Albuterol (COMBIVENT RESPIMAT) 20-100 MCG/ACT AERS respimat Inhale 1 puff into the lungs every 6 (six) hours as needed for wheezing or  shortness of breath.  Marland Kitchen ipratropium-albuterol (DUONEB) 0.5-2.5 (3) MG/3ML SOLN Inhale 3 mLs into the lungs every 6 (six) hours as needed (wheezing/shortness of breath.).   Marland Kitchen lisinopril (ZESTRIL) 5 MG tablet Take 5 mg by mouth daily.  . Multiple Vitamin (MULTIVITAMIN WITH MINERALS) TABS tablet Take 1 tablet by mouth every other day. Alive  . potassium chloride (KLOR-CON) 10 MEQ CR tablet Take 10 mEq by mouth at bedtime.   Marland Kitchen tiZANidine (ZANAFLEX) 4 MG tablet Take 4 mg by mouth See admin instructions. Take 1 tablet (4 mg) by mouth scheduled at bedtime daily, may take an additional dose during the day if needed for spasms.  . Vitamin D, Ergocalciferol, (DRISDOL) 1.25 MG (50000 UT) CAPS capsule Take 5,000 Units by mouth every Monday.     Allergies:   Patient has no known allergies.   Past Medical History:  Diagnosis Date  . Aortic stenosis    s/p bioprosthetic valve replacement  . Ascending aortic aneurysm ([place])   . CAD (coronary artery disease) of bypass graft    s/p SVG to dRCA  . Cardiomyopathy 2006   EF 30%; has history of CHF  . CHF (congestive heart failure) ([place])   . COPD (chronic obstructive  pulmonary disease) ([facility])    FEV1/FVC -> 0.52 (69% of predicted)  . Lesion of buccal mucosa    right, with non-restoable abscessed teeth  . Osteoarthritis   . Tobacco abuse     Past Surgical History:  Procedure Laterality Date  . AORTIC VALVE REPLACEMENT     bioprosthetic, 01/2006 - cannot find record of surgery (has scar)  . COLONOSCOPY N/A 01/01/2017   Procedure: COLONOSCOPY;  Surgeon: Laurence Spates, MD;  Location[facility] END[facility]COPY;  Service: Endoscopy;  Laterality: N/A;  . ESOPHAGOGASTRODUODENOSCOPY N/A 01/01/2017   Procedure: ESOPHAGOGASTRODUODENOSCOPY (EGD);  Surgeon: Laurence Spates, MD;  Location: [facility] ENDOSCOPY;  Service: Endoscopy;  Laterality: N/A;  . TONSILLECTOMY       Social History:  The patient  reports that he quit smoking about 4 years ago. His smoking use included cigarettes. He  started smoking about 63 years ago. He has a 177.00 pack-year smoking history. He has never used smokeless tobacco. He reports that he does not drink alcohol or use drugs.   Family History:  The patient's family history includes Cancer in his brother and mother; Coronary artery disease in his father; Emphysema in his brother, father, mother, and sister; Heart failure in his father.    ROS:  Please see the history of present illness. All other systems are reviewed and  Negative to the above problem except as noted.    PHYSICAL EXAM: VS:  BP (!) 152/86   Pulse 74   Ht 5\' 6"  (1.676 m)   Wt 156 lb (70.8 kg)   SpO2 97%   BMI 25.18 kg/m   GEN: Well nourished, well developed, in no acute distress  HEENT: normal  Neck: no JVD, carotid bruits Cardiac: RRR; Gr II/VI systolic murmur , rubs, or gallops  NO LE edema  Respiratory: Decreased airflow bilaterally  GI: soft, nontender, nondistended, + BS  No hepatomegaly  MS: no deformity Moving all extremities   Skin: warm and dry, no rash Neuro:  Strength and sensation are intact Psych: euthymic mood, full affect   EKG:  EKG is ordered   SR 71 bpm  First degree AV block  PR 238 msec.  Lateral MI   ANterior MI  No signif change from previous EKG     Lipid Panel    Component Value Date/Time   CHOL 143 08/18/2017 1057   TRIG 34 08/18/2017 1057   HDL 69 08/18/2017 1057   CHOLHDL 2.1 08/18/2017 1057   CHOLHDL 2.2 03/18/2016 1016   VLDL 10 03/18/2016 1016   LDLCALC 67 08/18/2017 1057      Wt Readings from Last 3 Encounters:  02/28/20 156 lb (70.8 kg)  02/10/20 156 lb 12.8 oz (71.1 kg)  12/10/19 152 lb (68.9 kg)      ASSESSMENT AND PLAN:  1  AV dz echo s/p AVR   Echo in Feb 2020 prosthesis is OK   Cntinue to follow periodically  2 aortic aneurysm.  Follows with [employer]  Aorta remains stable  3 hypertension  BP is high today but better at home    He does have inermitt dizziness   Encouraged him to stay hydrated.    4 CAD  No  symptoms of angina   COntinue to follow  Keep on ASA  5 hyperlipidemia.  Last lipids in Nov 2018 LDL was 67  HDL 69  F?U in Jan/Feb 2022    Signed, Dorris Carnes, MD  02/28/2020 2:57 PM    [place] [id number]  [address], [place], [place]  [address] Phone: [phone]; Fax: [phone]

## 2020-02-28 NOTE — Patient Instructions (Signed)
Medication Instructions:  *If you need a refill on your cardiac medications before your next appointment, please call your pharmacy*  Lab Work: If you have labs (blood work) drawn today and your tests are completely normal, you will receive your results only by: Marland Kitchen MyChart Message (if you have MyChart) OR . A paper copy in the mail If you have any lab test that is abnormal or we need to change your treatment, we will call you to review the results.  Follow-Up: At [facility], you and your health needs are our priority.  As part of our continuing mission to provide you with exceptional heart care, we have created designated Provider Care Teams.  These Care Teams include your primary Cardiologist (physician) and Advanced Practice Providers (APPs -  Physician Assistants and Nurse Practitioners) who all work together to provide you with the care you need, when you need it.  We recommend signing up for the patient portal called "MyChart".  Sign up information is provided on this After Visit Summary.  MyChart is used to connect with patients for Virtual Visits (Telemedicine).  Patients are able to view lab/test results, encounter notes, upcoming appointments, etc.  Non-urgent messages can be sent to your provider as well.   To learn more about what you can do with MyChart, go to [email or web address].    Your next appointment:   9 month(s)  The format for your next appointment:   In Person  Provider:   You may see Dorris Carnes, MD or one of the following Advanced Practice Providers on your designated Care Team:    Richardson Dopp, PA-C  [place], [place]

## 2020-03-27 ENCOUNTER — Telehealth: Payer: Self-pay | Admitting: Internal Medicine

## 2020-03-27 NOTE — Telephone Encounter (Signed)
We received a fax from [facility] office requesting surgical clearance for oral surgery. Called patient and advised him that he would need to schedule an appointment with one of our NPs for surgical clearance in Dr. Mauricio Po absence.  Surgical clearance form left in Dr. Mauricio Po pink folder in [place].

## 2020-04-14 ENCOUNTER — Telehealth: Payer: Self-pay | Admitting: *Deleted

## 2020-04-14 NOTE — Telephone Encounter (Signed)
   [facility] Pre-operative Risk Assessment    HEARTCARE STAFF: - Please ensure there is not already an duplicate clearance open for this procedure. - Under Visit Info/Reason for Call, type in Other and utilize the format Clearance MM/DD/YY or Clearance TBD. Do not use dashes or single digits. - If request is for dental extraction, please clarify the # of teeth to be extracted.  Request for surgical clearance: DR. Hoyt Koch IS REQUESTING RECORDS TO BE SENT AS WELL, (RECENT LABS, MED LIST)  1. What type of surgery is being performed? EXTRACTION OF 10 TEETH, ALVEOLOPLASTY , REMOVE SOFT TISSUE LESION OF RIGHT BUCCAL VESTIBULE  2. When is this surgery scheduled? TBD   3. What type of clearance is required (medical clearance vs. Pharmacy clearance to hold med vs. Both)? MEDICAL  4. Are there any medications that need to be held prior to surgery and how long? ASA ; DOES PT NEED SBE PER DR. Hoyt Koch  5. Practice name and name of physician performing surgery? SCOTT JENSEN, D.M.D, PAC   6. What is the office phone number? [phone]   7.   What is the office fax number? [phone]  8.   Anesthesia type (None, local, MAC, general) ? GENERAL   Julaine Hua 04/14/2020, 2:05 PM  _________________________________________________________________   (provider comments below)

## 2020-04-15 ENCOUNTER — Telehealth: Payer: Self-pay | Admitting: *Deleted

## 2020-04-15 MED ORDER — AMOXICILLIN 500 MG PO TABS
ORAL_TABLET | ORAL | 3 refills | Status: DC
Start: 2020-04-15 — End: 2020-07-31

## 2020-04-15 NOTE — Telephone Encounter (Signed)
Called patient for the 2nd time to schedule OV for surgical clearance.  Reached patient and he is scheduled to see Beth on 04/29/20 at 10:30 am, advised to arrive at 10:15 am for check in.  Nothing further needed.

## 2020-04-15 NOTE — Telephone Encounter (Signed)
Patient returning call.

## 2020-04-15 NOTE — Telephone Encounter (Signed)
Call placed to pt re: surgical clearance.  Was speaking to pt to make sure he was aware that he needed Abx to take 30-60 mins prior to his dental procedure. Not sure if pt lost the call or what, but never got a response from pt after saying hello, hello, hello.  Called back and left a message for pt to call back. I will also route back to the requesting surgeon's office to make them aware as well. Will send in Amoxicillin 500 mg and have pt take 4 tablets po q 30-60 min prior to [facility].

## 2020-04-15 NOTE — Telephone Encounter (Signed)
   Primary Cardiologist: Dorris Carnes, MD  Chart reviewed as part of pre-operative protocol coverage. Given past medical history and time since last visit, based on ACC/AHA guidelines, [name] [name] would be at acceptable risk for the planned procedure without further cardiovascular testing.   OK to hold aspirin 3-5 days pre op if needed. The patient will require SBE prophylaxis- Amoxacillin 2 gm po 30-60 minutes pre op.   I will route this recommendation to the requesting party via Epic fax function and remove from pre-op pool.  Please call with questions.  Kerin Ransom, PA-C 04/15/2020, 9:11 AM

## 2020-04-15 NOTE — Addendum Note (Signed)
Addended by: Gaetano Net on: 04/15/2020 09:28 AM   Modules accepted: Orders

## 2020-04-15 NOTE — Telephone Encounter (Signed)
Returned call to pt.  He has been made aware that he will need to take the Amoxicillin 500 mg taking 4 tablets 30-60 mins prior to dental procedures. Pt verbalized understanding.

## 2020-04-29 ENCOUNTER — Ambulatory Visit (INDEPENDENT_AMBULATORY_CARE_PROVIDER_SITE_OTHER): Payer: Medicare Other | Admitting: Pulmonary Disease

## 2020-04-29 ENCOUNTER — Ambulatory Visit (INDEPENDENT_AMBULATORY_CARE_PROVIDER_SITE_OTHER): Payer: Medicare Other

## 2020-04-29 ENCOUNTER — Encounter: Payer: Self-pay | Admitting: Pulmonary Disease

## 2020-04-29 ENCOUNTER — Other Ambulatory Visit: Payer: Self-pay

## 2020-04-29 VITALS — BP 136/68 | HR 82 | Temp 98.1°F | Ht 66.0 in | Wt 157.4 lb

## 2020-04-29 DIAGNOSIS — J432 Centrilobular emphysema: Secondary | ICD-10-CM | POA: Diagnosis not present

## 2020-04-29 DIAGNOSIS — J449 Chronic obstructive pulmonary disease, unspecified: Secondary | ICD-10-CM

## 2020-04-29 DIAGNOSIS — Z7189 Other specified counseling: Secondary | ICD-10-CM | POA: Diagnosis not present

## 2020-04-29 DIAGNOSIS — F17200 Nicotine dependence, unspecified, uncomplicated: Secondary | ICD-10-CM

## 2020-04-29 MED ORDER — BUDESONIDE-FORMOTEROL FUMARATE 160-4.5 MCG/ACT IN AERO: 2.0000 | INHALATION_SPRAY | Freq: Two times a day (BID) | RESPIRATORY_TRACT | 3 refills | Status: DC

## 2020-04-29 MED ORDER — IPRATROPIUM-ALBUTEROL 0.5-2.5 (3) MG/3ML IN SOLN: 3.0000 mL | Freq: Four times a day (QID) | RESPIRATORY_TRACT | 3 refills | Status: DC | PRN

## 2020-04-29 MED ORDER — ALBUTEROL SULFATE HFA 108 (90 BASE) MCG/ACT IN AERS: 2.0000 | INHALATION_SPRAY | RESPIRATORY_TRACT | 11 refills | Status: DC | PRN

## 2020-04-29 NOTE — Progress Notes (Signed)
@Patient  ID: [name] [name], male    DOB: 15-Dec-1945, 74 y.o.   MRN: [id number]  Chief Complaint  Patient presents with  . Follow-up    Surgical clearance, dental procedure, followed in our office for COPD, needs pulmonary function testing    Referring provider: Nolene Ebbs, MD  HPI:  74 year old male former smoker followed in our office for COPD  PMH: Restless leg syndrome, dyslipidemia, hypertension, osteoarthritis, aortic valve replacement Smoker/ Smoking History: Former smoker.  Quit 2017.  177-pack-year smoking history. Maintenance: Symbicort 160 Pt of: Dr. Shearon Stalls  04/29/2020  - Visit   74 year old male former smoker followed in our office for COPD.  He is followed by Dr. Shearon Stalls.  He is presenting today as a surgical clearance.  He is preparing to have 10 teeth removed.  He is already received surgical clearance from cardiology based off the telephone note on 04/14/2020.  It was recommended by their office that patient take amoxicillin.  They have prescribed this.  Patient is followed by Dr. Harrington Challenger.  Patient was last seen in our office in May/2021 by Dr. Shearon Stalls.  Plan of care from that office visit was to continue albuterol and Symbicort and follow-up in 3 months.  Patient presenting to office today reporting that he is at his baseline with his breathing.  He reports adherence to Symbicort 160.  He has been trialed on Spiriva Respimat in the past and did not feel that this helped clinically.  He is currently using his rescue inhaler 8-10 times a day.  He also continues to use duo nebs every 6 hours.  We will discuss and review this today.  Patient is yet to be scheduled for pulmonary function testing to further assess if his COPD has advanced.  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  Significant pack-year smoking history of 177 pack years.  Patient walked in our office today without any oxygen desaturations.  Questionaires /  Pulmonary Flowsheets:   ACT:  No flowsheet data found.  MMRC: mMRC Dyspnea Scale mMRC Score  04/29/2020 4  02/10/2020 4    Epworth:  No flowsheet data found.  Tests:   11/22/2018-echocardiogram-LV ejection fraction 55 to 60%, right ventricle is normal systolic function  2/54/2706-CBJSEGBTD function test-FVC 2.5 (57% predicted), postbronchodilator ratio 42, postbronchodilator FEV1 1.37 (44% predicted), positive bronchodilator response, TLC 6.38 (105% predicted), DLCO 12.1 (56% predicted)  FENO:  No results found for: NITRICOXIDE  PFT: No flowsheet data found.  WALK:  SIX MIN WALK 04/29/2020 02/10/2020 08/23/2019  Supplimental Oxygen during Test? (L/min) No Yes No  O2 Flow Rate - 3 -  Type - Continuous -  Tech Comments: Average pace. Patient had to stop on second lap due to being fatigued. No desats Pt. walked at a slow pace.  Walked 3 laps, complained of sob after 1/2 of 1st lap.  Had to stop 4 times to rest.  Breathing improved with application of oxygen with POC. patient could only perform 1 lap he was feeling his heart beating fast, and out of breath, sat down then walked back to room.    Imaging: No results found.  Lab Results:  CBC    Component Value Date/Time   WBC 6.2 03/22/2019 0742   RBC 4.16 (L) 03/22/2019 0742   HGB 10.0 (L) 03/22/2019 0742   HGB 10.2 (L) 08/18/2017 1057   HCT 33.3 (L) 03/22/2019 0742   HCT 32.7 (L) 08/18/2017 1057   PLT 154  03/22/2019 0742   PLT 195 08/18/2017 1057   MCV 80.0 03/22/2019 0742   MCV 82 08/18/2017 1057   MCH 24.0 (L) 03/22/2019 0742   MCHC 30.0 03/22/2019 0742   RDW 15.8 (H) 03/22/2019 0742   RDW 16.0 (H) 08/18/2017 1057   LYMPHSABS 0.5 (L) 12/31/2016 0754   MONOABS 0.1 12/31/2016 0754   EOSABS 0.0 12/31/2016 0754   BASOSABS 0.0 12/31/2016 0754    BMET    Component Value Date/Time   NA 140 03/22/2019 0742   NA 142 08/18/2017 1057   K 3.5 03/22/2019 0742   CL 106 03/22/2019 0742   CO2 24 03/22/2019 0742    GLUCOSE 96 03/22/2019 0742   BUN 15 03/22/2019 0742   BUN 12 08/18/2017 1057   CREATININE 0.96 03/22/2019 0742   CREATININE 1.17 01/22/2016 1028   CALCIUM 8.7 (L) 03/22/2019 0742   GFRNONAA >60 03/22/2019 0742   GFRAA >60 03/22/2019 0742    BNP No results found for: BNP  ProBNP No results found for: PROBNP  Specialty Problems      Pulmonary Problems   COPD with acute exacerbation ([place])    Followed in Pulmonary clinic/ [facility]/ [place]    - PFT's 10/14/11  FEV1  1.00 (33%) ratio 40 and 36% better p B2,  DLCO 56%    - HFA 75% 11/11/11       COPD mixed type (HCC)      No Known Allergies  Immunization History  Administered Date(s) Administered  . Fluad Quad(high Dose 65+) 05/29/2019  . Moderna SARS-COVID-2 Vaccination 11/28/2019, 12/26/2019  . Pneumococcal Conjugate-13 12/05/2018  . Zoster Recombinat (Shingrix) 02/09/2018, 12/05/2018    Past Medical History:  Diagnosis Date  . Aortic stenosis    s/p bioprosthetic valve replacement  . Ascending aortic aneurysm ([place])   . CAD (coronary artery disease) of bypass graft    s/p SVG to dRCA  . Cardiomyopathy 2006   EF 30%; has history of CHF  . CHF (congestive heart failure) ([place])   . COPD (chronic obstructive pulmonary disease) (HCC)    FEV1/FVC -> 0.52 (69% of predicted)  . Lesion of buccal mucosa    right, with non-restoable abscessed teeth  . Osteoarthritis   . Tobacco abuse     Tobacco History: Social History   Tobacco Use  Smoking Status Former Smoker  . Packs/day: 3.00  . Years: 59.00  . Pack years: 177.00  . Types: Cigarettes  . Start date: 73  . Quit date: 2017  . Years since quitting: 4.5  Smokeless Tobacco Never Used   Counseling given: Not Answered   Continue to not smoke  Outpatient Encounter Medications as of 04/29/2020  Medication Sig  . acetaminophen (TYLENOL) 500 MG tablet Take 500 mg by mouth 2 (two) times daily as needed (pain.).  Marland Kitchen albuterol (VENTOLIN HFA) 108 (90 Base)  MCG/ACT inhaler Inhale 2 puffs into the lungs every 4 (four) hours as needed for wheezing or shortness of breath. Three month supply.  Marland Kitchen amoxicillin (AMOXIL) 500 MG tablet Take 4 tablets by mouth 30-60 mins prior to your dental procedures  . aspirin EC 81 MG tablet Take 1 tablet (81 mg total) daily by mouth.  Marland Kitchen atorvastatin (LIPITOR) 40 MG tablet TAKE 1 TABLET(40 MG) BY MOUTH DAILY AT 6 PM  . BETA CAROTENE PO Take 1 tablet by mouth 2 (two) times a week.  . budesonide-formoterol (SYMBICORT) 160-4.5 MCG/ACT inhaler Inhale 2 puffs into the lungs 2 (two) times daily.  Marland Kitchen  dextromethorphan-guaiFENesin (MUCINEX DM) 30-600 MG per 12 hr tablet Take 1 tablet by mouth every 12 (twelve) hours as needed for cough (mucous build up).   . furosemide (LASIX) 40 MG tablet Take 40 mg by mouth every evening.   . Ipratropium-Albuterol (COMBIVENT RESPIMAT) 20-100 MCG/ACT AERS respimat Inhale 1 puff into the lungs every 6 (six) hours as needed for wheezing or shortness of breath.  Marland Kitchen ipratropium-albuterol (DUONEB) 0.5-2.5 (3) MG/3ML SOLN Inhale 3 mLs into the lungs every 6 (six) hours as needed (wheezing/shortness of breath.).  Marland Kitchen lisinopril (ZESTRIL) 5 MG tablet Take 5 mg by mouth daily.  . Multiple Vitamin (MULTIVITAMIN WITH MINERALS) TABS tablet Take 1 tablet by mouth every other day. Alive  . potassium chloride (KLOR-CON) 10 MEQ CR tablet Take 10 mEq by mouth at bedtime.   Marland Kitchen tiZANidine (ZANAFLEX) 4 MG tablet Take 4 mg by mouth See admin instructions. Take 1 tablet (4 mg) by mouth scheduled at bedtime daily, may take an additional dose during the day if needed for spasms.  . Vitamin D, Ergocalciferol, (DRISDOL) 1.25 MG (50000 UT) CAPS capsule Take 5,000 Units by mouth every Monday.  . [DISCONTINUED] albuterol (VENTOLIN HFA) 108 (90 Base) MCG/ACT inhaler Inhale 2 puffs into the lungs every 4 (four) hours as needed for wheezing or shortness of breath. Three month supply.  . [DISCONTINUED] budesonide-formoterol (SYMBICORT)  160-4.5 MCG/ACT inhaler Inhale 2 puffs into the lungs 2 (two) times daily.  . [DISCONTINUED] ipratropium-albuterol (DUONEB) 0.5-2.5 (3) MG/3ML SOLN Inhale 3 mLs into the lungs every 6 (six) hours as needed (wheezing/shortness of breath.).    No facility-administered encounter medications on file as of 04/29/2020.     Review of Systems  Review of Systems  Constitutional: Positive for fatigue. Negative for activity change, chills, fever and unexpected weight change.  HENT: Negative for postnasal drip, rhinorrhea, sinus pressure, sinus pain and sore throat.   Eyes: Negative.   Respiratory: Positive for shortness of breath. Negative for cough and wheezing.   Cardiovascular: Negative for chest pain and palpitations.  Gastrointestinal: Negative for diarrhea, nausea and vomiting.  Endocrine: Negative.   Genitourinary: Negative.   Musculoskeletal: Negative.   Skin: Negative.   Neurological: Negative for dizziness and headaches.  Psychiatric/Behavioral: Negative.  Negative for dysphoric mood. The patient is not nervous/anxious.   All other systems reviewed and are negative.    Physical Exam  BP (!) 136/68 (BP Location: Left Arm, Cuff Size: Normal)   Pulse 82   Temp 98.1 F (36.7 C) (Oral)   Ht 5\' 6"  (1.676 m)   Wt 157 lb 6.4 oz (71.4 kg)   SpO2 95%   BMI 25.41 kg/m   Wt Readings from Last 5 Encounters:  04/29/20 157 lb 6.4 oz (71.4 kg)  02/28/20 156 lb (70.8 kg)  02/10/20 156 lb 12.8 oz (71.1 kg)  12/10/19 152 lb (68.9 kg)  10/18/19 153 lb 3.2 oz (69.5 kg)    BMI Readings from Last 5 Encounters:  04/29/20 25.41 kg/m  02/28/20 25.18 kg/m  02/10/20 25.31 kg/m  12/10/19 24.53 kg/m  10/18/19 24.73 kg/m     Physical Exam Vitals and nursing note reviewed.  Constitutional:      General: He is not in acute distress.    Appearance: Normal appearance. He is normal weight.     Comments: Thin frail adult male  HENT:     Head: Normocephalic and atraumatic.     Right Ear:  Hearing, tympanic membrane, ear canal and external ear normal. There is  no impacted cerumen.     Left Ear: Hearing, tympanic membrane, ear canal and external ear normal. There is no impacted cerumen.     Nose: Nose normal. No mucosal edema, congestion or rhinorrhea.     Right Turbinates: Not enlarged.     Left Turbinates: Not enlarged.     Mouth/Throat:     Mouth: Mucous membranes are dry.     Pharynx: Oropharynx is clear. No oropharyngeal exudate.  Eyes:     Pupils: Pupils are equal, round, and reactive to light.  Cardiovascular:     Rate and Rhythm: Normal rate and regular rhythm.     Pulses: Normal pulses.     Heart sounds: Normal heart sounds. No murmur heard.   Pulmonary:     Effort: Pulmonary effort is normal.     Breath sounds: No decreased breath sounds, wheezing or rales.     Comments: Diminished breath sounds that exam Musculoskeletal:     Cervical back: Normal range of motion.     Right lower leg: No edema.     Left lower leg: No edema.  Lymphadenopathy:     Cervical: No cervical adenopathy.  Skin:    General: Skin is warm and dry.     Capillary Refill: Capillary refill takes less than 2 seconds.     Findings: No erythema or rash.  Neurological:     General: No focal deficit present.     Mental Status: He is alert and oriented to person, place, and time.     Motor: No weakness.     Coordination: Coordination normal.     Gait: Gait abnormal.  Psychiatric:        Mood and Affect: Mood normal.        Behavior: Behavior normal. Behavior is cooperative.        Thought Content: Thought content normal.        Judgment: Judgment normal.       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  TOBACCO ABUSE Plan: Continue to not smoke  Surgical counseling visit Patient has been cleared by cardiology Patient has antibiotics and cardiology is recommending that he takes prior to dental procedure   Peri-operative Assessment of Pulmonary Risk for Non-Thoracic Surgery:  ForMr. Buelow, moderate to high risk of perioperative pulmonary complications is increased by:  Age greater than 63 years  COPD - likely COPD GOLD IV  Former Smoker    Respiratory complications generally occur in 1% of ASA Class I patients, 5% of ASA Class II and 10% of ASA Class III-IV patients These complications rarely result in mortality and iclude postoperative pneumonia, atelectasis, pulmonary embolism, ARDS and increased time requiring postoperative mechanical ventilation.  Overall, I recommend proceeding with the surgery if the risk for respiratory complications are outweighed by the potential benefits. This will need to be discussed between the patient and surgeon.  To reduce risks of respiratory complications, I recommend: --Pre- and post-operative incentive spirometry performed frequently while awake --Avoiding use of pancuronium during anesthesia.  I have discussed the risk  factors and recommendations above with the patient.      Return in about 2 months (around 06/30/2020), or if symptoms worsen or fail to improve, for Follow up with Dr. Shearon Stalls, Follow up for FULL PFT - 60 min.   Lauraine Rinne, NP 04/29/2020   This appointment required 32 minutes of patient care (this includes precharting, chart review, review of results, face-to-face care, etc.).

## 2020-04-29 NOTE — Assessment & Plan Note (Signed)
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

## 2020-04-29 NOTE — Patient Instructions (Addendum)
You were seen today by Lauraine Rinne, NP  for:   1. COPD mixed type ([place])  Walk today in office  Chest Xray today   Continue Symbicort 160 >>> 2 puffs in the morning right when you wake up, rinse out your mouth after use, 12 hours later 2 puffs, rinse after use >>> Take this daily, no matter what >>> This is not a rescue inhaler   Continue scheduled duo nebs every 6 hours via your nebulizer  Only use your albuterol as a rescue medication to be used if you can't catch your breath by resting or doing a relaxed purse lip breathing pattern.  - The less you use it, the better it will work when you need it. - Ok to use up to 2 puffs  every 4 hours if you must but call for immediate appointment if use goes up over your usual need - Don't leave home without it !!  (think of it like the spare tire for your car)   We will repeat pulmonary function testing  Note your daily symptoms > remember "red flags" for COPD:   >>>Increase in cough >>>increase in sputum production >>>increase in shortness of breath or activity  intolerance.   If you notice these symptoms, please call the office to be seen.   2. Surgical counseling visit  Peri-operative Assessment of Pulmonary Risk for Non-Thoracic Surgery:  ForMr. Whalin, moderate to high risk of perioperative pulmonary complications is increased by:  Age greater than 73 years  COPD - likely COPD GOLD IV  Former Smoker    Respiratory complications generally occur in 1% of ASA Class I patients, 5% of ASA Class II and 10% of ASA Class III-IV patients These complications rarely result in mortality and iclude postoperative pneumonia, atelectasis, pulmonary embolism, ARDS and increased time requiring postoperative mechanical ventilation.  Overall, I recommend proceeding with the surgery if the risk for respiratory complications are outweighed by the potential benefits. This will need to be discussed between the patient and surgeon.  To reduce risks of  respiratory complications, I recommend: --Pre- and post-operative incentive spirometry performed frequently while awake --Avoiding use of pancuronium during anesthesia.  I have discussed the risk factors and recommendations above with the patient.   3. TOBACCO ABUSE   continue to not smoke   Follow Up:    Return in about 2 months (around 06/30/2020), or if symptoms worsen or fail to improve, for Follow up with Dr. Shearon Stalls, Follow up for FULL PFT - 60 min.   Please do your part to reduce the spread of COVID-19:      Reduce your risk of any infection  and COVID19 by using the similar precautions used for avoiding the common cold or flu:  Marland Kitchen Wash your hands often with soap and warm water for at least 20 seconds.  If soap and water are not readily available, use an alcohol-based hand sanitizer with at least 60% alcohol.  . If coughing or sneezing, cover your mouth and nose by coughing or sneezing into the elbow areas of your shirt or coat, into a tissue or into your sleeve (not your hands). Langley Gauss A MASK when in public  . Avoid shaking hands with others and consider head nods or verbal greetings only. . Avoid touching your eyes, nose, or mouth with unwashed hands.  . Avoid close contact with people who are sick. . Avoid places or events with large numbers of people in one location, like concerts or sporting  events. . If you have some symptoms but not all symptoms, continue to monitor at home and seek medical attention if your symptoms worsen. . If you are having a medical emergency, call 911.   [place] / e-Visit: [email or web address]         [facility]: [facility]: [phone]                   [facility]: [phone]     It is flu season:   >>> Best ways to protect herself from the flu: Receive the yearly flu vaccine,  practice good hand hygiene washing with soap and also using hand sanitizer when available, eat a nutritious meals, get adequate rest, hydrate appropriately   Please contact the office if your symptoms worsen or you have concerns that you are not improving.   Thank you for choosing [facility] for your healthcare, and for allowing [place] to partner with you on your healthcare journey. I am thankful to be able to provide care to you today.   Wyn Quaker FNP-C

## 2020-04-29 NOTE — Assessment & Plan Note (Signed)
Plan: Continue to not smoke 

## 2020-04-29 NOTE — Assessment & Plan Note (Signed)
Patient has been cleared by cardiology Patient has antibiotics and cardiology is recommending that he takes prior to dental procedure   Peri-operative Assessment of Pulmonary Risk for Non-Thoracic Surgery:  ForMr. Billups, moderate to high risk of perioperative pulmonary complications is increased by:  Age greater than 22 years  COPD - likely COPD GOLD IV  Former Smoker    Respiratory complications generally occur in 1% of ASA Class I patients, 5% of ASA Class II and 10% of ASA Class III-IV patients These complications rarely result in mortality and iclude postoperative pneumonia, atelectasis, pulmonary embolism, ARDS and increased time requiring postoperative mechanical ventilation.  Overall, I recommend proceeding with the surgery if the risk for respiratory complications are outweighed by the potential benefits. This will need to be discussed between the patient and surgeon.  To reduce risks of respiratory complications, I recommend: --Pre- and post-operative incentive spirometry performed frequently while awake --Avoiding use of pancuronium during anesthesia.  I have discussed the risk factors and recommendations above with the patient.

## 2020-05-01 ENCOUNTER — Other Ambulatory Visit: Payer: Self-pay | Admitting: Internal Medicine

## 2020-05-25 ENCOUNTER — Other Ambulatory Visit: Payer: Self-pay | Admitting: Thoracic Surgery (Cardiothoracic Vascular Surgery)

## 2020-05-25 DIAGNOSIS — I712 Thoracic aortic aneurysm, without rupture, unspecified: Secondary | ICD-10-CM

## 2020-06-17 ENCOUNTER — Ambulatory Visit
Admission: RE | Admit: 2020-06-17 | Discharge: 2020-06-17 | Disposition: A | Discharge status: Home / Self Care | Payer: Medicare Other | Source: Ambulatory Visit | Attending: Thoracic Surgery (Cardiothoracic Vascular Surgery) | Admitting: Thoracic Surgery (Cardiothoracic Vascular Surgery)

## 2020-06-17 DIAGNOSIS — I712 Thoracic aortic aneurysm, without rupture, unspecified: Secondary | ICD-10-CM

## 2020-06-17 MED ORDER — IOPAMIDOL (ISOVUE-370) INJECTION 76%
75.0000 mL | Freq: Once | INTRAVENOUS | Status: AC | PRN
  Administered 2020-06-17: 75 mL via INTRAVENOUS

## 2020-06-18 ENCOUNTER — Ambulatory Visit (INDEPENDENT_AMBULATORY_CARE_PROVIDER_SITE_OTHER): Payer: Medicare Other | Admitting: Internal Medicine

## 2020-06-18 ENCOUNTER — Other Ambulatory Visit: Payer: Self-pay

## 2020-06-18 ENCOUNTER — Encounter: Payer: Self-pay | Admitting: Internal Medicine

## 2020-06-18 VITALS — BP 140/80 | HR 83 | Temp 97.3°F | Ht 65.0 in | Wt 155.2 lb

## 2020-06-18 DIAGNOSIS — J432 Centrilobular emphysema: Secondary | ICD-10-CM

## 2020-06-18 DIAGNOSIS — Z23 Encounter for immunization: Secondary | ICD-10-CM

## 2020-06-18 DIAGNOSIS — Z9981 Dependence on supplemental oxygen: Secondary | ICD-10-CM | POA: Diagnosis not present

## 2020-06-18 LAB — PULMONARY FUNCTION TEST
DL/VA % pred: 41 %
DL/VA: 1.71 ml/min/mmHg/L
DLCO cor % pred: 39 %
DLCO cor: 8.69 ml/min/mmHg
DLCO unc % pred: 39 %
DLCO unc: 8.69 ml/min/mmHg
FEF 25-75 Post: 0.47 L/sec
FEF 25-75 Pre: 0.34 L/sec
FEF2575-%Change-Post: 39 %
FEF2575-%Pred-Post: 24 %
FEF2575-%Pred-Pre: 17 %
FEV1-%Change-Post: 16 %
FEV1-%Pred-Post: 39 %
FEV1-%Pred-Pre: 34 %
FEV1-Post: 1.02 L
FEV1-Pre: 0.87 L
FEV1FVC-%Change-Post: 6 %
FEV1FVC-%Pred-Pre: 49 %
FEV6-%Change-Post: 16 %
FEV6-%Pred-Post: 73 %
FEV6-%Pred-Pre: 62 %
FEV6-Post: 2.43 L
FEV6-Pre: 2.08 L
FEV6FVC-%Change-Post: 6 %
FEV6FVC-%Pred-Post: 97 %
FEV6FVC-%Pred-Pre: 91 %
FVC-%Change-Post: 10 %
FVC-%Pred-Post: 75 %
FVC-%Pred-Pre: 68 %
FVC-Post: 2.67 L
FVC-Pre: 2.43 L
Post FEV1/FVC ratio: 38 %
Post FEV6/FVC ratio: 91 %
Pre FEV1/FVC ratio: 36 %
Pre FEV6/FVC Ratio: 86 %
RV % pred: 203 %
RV: 4.52 L
TLC % pred: 132 %
TLC: 7.96 L

## 2020-06-18 NOTE — Progress Notes (Signed)
Full PFT completed today ? ?

## 2020-06-18 NOTE — Patient Instructions (Signed)
The patient should have follow up scheduled with myself in 4 months.     

## 2020-06-18 NOTE — Progress Notes (Signed)
[name] [name]    [id number]    [birth year]  Primary Care Physician:Avbuere, Christean Grief, MD Date of Appointment: 06/18/2020 Established Patient Visit  Chief complaint:   Chief Complaint  Patient presents with  . Follow-up    pft results    HPI: History of COPD.  He had pulmonary function testing when he saw Dr. Melvyn Novas in 2013.  Has been on symbicort since then,  Takes albuterol mdi 8 times/day. Takes combivent 2-3 times a day, and also has a DuoNeb nebulizer at home which he takes 4 times a day.  Has not tolerated Spiriva in the past.  History includes: AV dz (s/p AVR in 2007 ),CAD (s/p SVG to RCA)dilated ascending aorta, COPD, hypertension, atherosclerosis, moderate LV dysfunction in the past   Interval Updates: No interval hospitalizations or ED visits. Received clearance for upcoming dental surgery. Using portable oxygen concentrator at home as needed. Tolerating inhaler therapy.  Recently bought a scooter and has been driving around. Feels better when he is more active.   I have reviewed the patient's family social and past medical history and updated as appropriate.   Past Medical History:  Diagnosis Date  . Aortic stenosis    s/p bioprosthetic valve replacement  . Ascending aortic aneurysm ([place])   . CAD (coronary artery disease) of bypass graft    s/p SVG to dRCA  . Cardiomyopathy 2006   EF 30%; has history of CHF  . CHF (congestive heart failure) ([place])   . COPD (chronic obstructive pulmonary disease) ([facility])    FEV1/FVC -> 0.52 (69% of predicted)  . Lesion of buccal mucosa    right, with non-restoable abscessed teeth  . Osteoarthritis   . Tobacco abuse     Past Surgical History:  Procedure Laterality Date  . AORTIC VALVE REPLACEMENT     bioprosthetic, 01/2006 - cannot find record of surgery (has scar)  . COLONOSCOPY N/A 01/01/2017   Procedure: COLONOSCOPY;  Surgeon: Laurence Spates, MD;  Location[facility] ENDOSCOPY;  Service: Endoscopy;  Laterality:  N/A;  . ESOPHAGOGASTRODUODENOSCOPY N/A 01/01/2017   Procedure: ESOPHAGOGASTRODUODENOSCOPY (EGD);  Surgeon: Laurence Spates, MD;  Location: [facility] ENDOSCOPY;  Service: Endoscopy;  Laterality: N/A;  . TONSILLECTOMY      Family History  Problem Relation Age of Onset  . Cancer Mother        uterine cancer  . Emphysema Mother   . Heart failure Father   . Coronary artery disease Father   . Emphysema Father   . Emphysema Sister   . Cancer Brother        neck/throat cancer  . Emphysema Brother     Social History   Occupational History  . Occupation: Retired    Comment: Dealer  Tobacco Use  . Smoking status: Former Smoker    Packs/day: 3.00    Years: 59.00    Pack years: 177.00    Types: Cigarettes    Start date: 35    Quit date: 2017    Years since quitting: 4.7  . Smokeless tobacco: Never Used  Vaping Use  . Vaping Use: Never used  Substance and Sexual Activity  . Alcohol use: No    Comment: states he quit many years ago  . 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 Sexual activity: Not on file    Physical Exam: Blood pressure 140/80, pulse 83, temperature (!) 97.3 F (36.3 C), temperature source Temporal, height 5'  5" (1.651 m), weight 155 lb 3.2 oz (70.4 kg), SpO2 95 %.  Gen:      No acute distress, able to complete full sentences Lungs:    Short of breath at rest, diminished breath sounds bilaterally CV:         Regular rate and rhythm; no murmurs, rubs, or gallops.  No pedal edema Skin:      Warm and dry; no rashes  Data Reviewed: Imaging: I have personally reviewed the CT angio from August 2020.  Notably there is severe centrilobular emphysema throughout both lung fields upper and lower distribution has homogenous.  PFTs:  Pulmonary function testing sept 2021 demonstrates very severe airflow limitation with an FEV1 of 34% and positive BD response. severely reduced DLCO and lung volumes show hyperinflation consistent with  emphysema.  Immunization status: Immunization History  Administered Date(s) Administered  . Fluad Quad(high Dose 65+) 05/29/2019  . Moderna SARS-COVID-2 Vaccination 11/28/2019, 12/26/2019  . Pneumococcal Conjugate-13 12/05/2018  . Zoster Recombinat (Shingrix) 02/09/2018, 12/05/2018    Assessment:  Severe centrilobular emphysema FEV1 less 35% 155-pack-year smoking history, quit 2013 Status post aortic valve replacement Coronary artery disease status post CABG  Plan/Recommendations:  Continue albuterol and symbicort.  We discussed pulmonary rehabilitation today. He would like to think about it. We will give him his flu shot today as well.   Return to Care: Return in about 4 months (around 10/18/2020).  Lenice Llamas, MD Pulmonary and [place]

## 2020-06-23 ENCOUNTER — Ambulatory Visit (INDEPENDENT_AMBULATORY_CARE_PROVIDER_SITE_OTHER): Payer: Medicare Other | Admitting: Thoracic Surgery (Cardiothoracic Vascular Surgery)

## 2020-06-23 ENCOUNTER — Other Ambulatory Visit: Payer: Medicare Other

## 2020-06-23 ENCOUNTER — Encounter: Payer: Self-pay | Admitting: Thoracic Surgery (Cardiothoracic Vascular Surgery)

## 2020-06-23 ENCOUNTER — Other Ambulatory Visit: Payer: Self-pay

## 2020-06-23 VITALS — BP 160/80 | HR 61 | Temp 97.6°F | Resp 20 | Ht 65.0 in | Wt 155.0 lb

## 2020-06-23 DIAGNOSIS — I712 Thoracic aortic aneurysm, without rupture, unspecified: Secondary | ICD-10-CM

## 2020-06-23 NOTE — Progress Notes (Signed)
[address]       [place] [address]             [phone]     HPI: [name] returns for a scheduled follow-up visit.  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  He quit smoking in 2017.  He had a bicuspid aortic valve with severe aortic stenosis.  He had bioprosthetic AVR in 2007.  He was found to have an aortic root aneurysm on echocardiogram in 2016.  On CT it measured 5.2 cm.  He has been followed since that time.  His most recent echocardiogram in February 2020 showed a mean gradient of 60 mmHg and a peak of 29 mmHg for the bioprosthetic valve.  He continues to have shortness of breath with exertion.  That really has not changed over the past 6 months.  He recently saw Dr.Desai for COPD.  His pulmonary function has remained relatively stable.  He is not having any chest pain or leg swelling. Past Medical History:  Diagnosis Date  . Aortic stenosis    s/p bioprosthetic valve replacement  . Ascending aortic aneurysm ([place])   . CAD (coronary artery disease) of bypass graft    s/p SVG to dRCA  . Cardiomyopathy 2006   EF 30%; has history of CHF  . CHF (congestive heart failure) ([place])   . COPD (chronic obstructive pulmonary disease) (H[facility])    FEV1/FVC -> 0.52 (69% of predicted)  . Lesion of buccal mucosa    right, with non-restoable abscessed teeth  . Osteoarthritis   . Tobacco abuse      Current Outpatient Medications  Medication Sig Dispense Refill  . acetaminophen (TYLENOL) 500 MG tablet Take 500 mg by mouth 2 (two) times daily as needed (pain.).    Marland Kitchen albuterol (VENTOLIN HFA) 108 (90 Base) MCG/ACT inhaler Inhale 2 puffs into the lungs every 4 (four) hours as needed for wheezing or shortness of breath. Three month supply. 18 g  11  . amoxicillin (AMOXIL) 500 MG tablet Take 4 tablets by mouth 30-60 mins prior to your dental procedures 4 tablet 3  . aspirin EC 81 MG tablet Take 1 tablet (81 mg total) daily by mouth. 90 tablet 3  . atorvastatin (LIPITOR) 40 MG tablet TAKE 1 TABLET(40 MG) BY MOUTH DAILY AT 6 PM 90 tablet 1  . BETA CAROTENE PO Take 1 tablet by mouth 2 (two) times a week.    . budesonide-formoterol (SYMBICORT) 160-4.5 MCG/ACT inhaler Inhale 2 puffs into the lungs 2 (two) times daily. 1 Inhaler 3  . dextromethorphan-guaiFENesin (MUCINEX DM) 30-600 MG per 12 hr tablet Take 1 tablet by mouth every 12 (twelve) hours as needed for cough (mucous build up).     . furosemide (LASIX) 40 MG tablet Take 40 mg by mouth every evening.     . Ipratropium-Albuterol (COMBIVENT RESPIMAT) 20-100 MCG/ACT AERS respimat Inhale 1 puff into the lungs every 6 (six) hours as needed for wheezing or shortness of breath.    Marland Kitchen ipratropium-albuterol (DUONEB) 0.5-2.5 (3) MG/3ML SOLN Inhale 3 mLs into the lungs every 6 (six) hours as needed (wheezing/shortness of breath.). 360 mL 3  . lisinopril (ZESTRIL) 5 MG tablet Take 5 mg by mouth daily.    . Multiple Vitamin (MULTIVITAMIN WITH MINERALS)  TABS tablet Take 1 tablet by mouth every other day. Alive    . potassium chloride (KLOR-CON) 10 MEQ CR tablet Take 10 mEq by mouth at bedtime.     Marland Kitchen tiZANidine (ZANAFLEX) 4 MG tablet Take 4 mg by mouth See admin instructions. Take 1 tablet (4 mg) by mouth scheduled at bedtime daily, may take an additional dose during the day if needed for spasms.  2  . Vitamin D, Ergocalciferol, (DRISDOL) 1.25 MG (50000 UT) CAPS capsule Take 5,000 Units by mouth every Monday.     No current facility-administered medications for this visit.    Physical Exam BP (!) 160/80   Pulse 61   Temp 97.6 F (36.4 C) (Skin)   Resp 20   Ht 5\' 5"  (1.651 m)   Wt 155 lb (70.3 kg)   SpO2 93% Comment: RA  BMI 25.27 kg/m  74 year old man in no acute distress Alert and oriented  x3 with no focal deficits Cardiac regular rate and rhythm with a 2/6 systolic murmur Lungs diminished breath sounds bilaterally, no rales or wheezing Increased AP diameter of chest No peripheral edema  Diagnostic Tests: CT ANGIOGRAPHY CHEST WITH CONTRAST  TECHNIQUE: Multidetector CT imaging of the chest was performed using the standard protocol during bolus administration of intravenous contrast. Multiplanar CT image reconstructions and MIPs were obtained to evaluate the vascular anatomy.  CONTRAST:  40mL ISOVUE-370 IOPAMIDOL (ISOVUE-370) INJECTION 76%  COMPARISON:  11/29/2019  FINDINGS: Cardiovascular: Preferential opacification of the thoracic aorta. 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 Unchanged enlargement of the sinuses of Valsalva, measuring up to 5.1 cm. Aortic valve prosthesis. The descending thoracic aorta is normal in caliber, measuring up to 2.7 x 2.6 cm. Scattered atherosclerosis. Status post CABG. Normal heart size. Left coronary artery calcifications. No pericardial effusion.  Mediastinum/Nodes: No enlarged mediastinal, hilar, or axillary lymph nodes. Thyroid gland, trachea, and esophagus demonstrate no significant findings.  Lungs/Pleura: Moderate centrilobular emphysema. Diffuse bilateral bronchial wall thickening. No pleural effusion or pneumothorax.  Upper Abdomen: No acute abnormality.  Musculoskeletal: No chest wall abnormality. No acute or significant osseous findings.  Review of the MIP images confirms the above findings.  IMPRESSION: 1. 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 Unchanged enlargement of the sinuses of Valsalva, measuring up to 5.1 cm. Ascending thoracic aortic aneurysm. Recommend semi-annual imaging followup by CTA or MRA and referral to cardiothoracic surgery if not already obtained. This  recommendation follows 2010 ACCF/AHA/AATS/ACR/ASA/SCA/SCAI/SIR/STS/SVM Guidelines for the Diagnosis and Management of Patients With Thoracic Aortic Disease. Circulation. 2010; 121: E993-Z169. Aortic aneurysm NOS (ICD10-I71.9) Aortic Atherosclerosis (ICD10-I70.0). 2. Aortic valve prosthesis. 3. Emphysema and diffuse bilateral bronchial wall thickening. Emphysema (ICD10-J43.9). 4. Coronary artery disease.   Electronically Signed   By: Eddie Candle M.D.   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  He quit smoking in 2017.  He was found to have a dilated aortic root on echocardiogram in 2016.  CT showed a 5.2cm aortic root at the sinuses of Valsalva.  He has been followed since then and it remained stable.  His tubular aorta is about 4.4 cm above the sinotubular  junction.  Hypertension-blood pressure elevated today.  He says that he checks it at home on frequent basis and is usually around 462 systolic.  I have processed the importance of continue to monitor that and if he is consistently above 1 30-1 40 he should let one of his doctors know so that adjustments can be made to his blood pressure regimen.  He currently is only on lisinopril for that.  Prosthetic aortic valve stenosis-mild on most recent echo.  Plan per Dr. Harrington Challenger.  Plan: Monitor blood pressure at home.  Notify MD if systolic above 863 Return in 6 months with CT angio of chest  I spent 22 minutes in review of records, images, and consultation with Mr. Fullington today. Melrose Nakayama, MD [facility] [phone]

## 2020-07-21 NOTE — H&P (Signed)
HISTORY AND PHYSICAL  [name] [name] is a 74 y.o. male patient referred by general dentist  For full mouth extractions.   C: bad teeth, dental pain.  No diagnosis found.  Past Medical History:  Diagnosis Date  . Aortic stenosis    s/p bioprosthetic valve replacement  . Ascending aortic aneurysm ([place])   . CAD (coronary artery disease) of bypass graft    s/p SVG to dRCA  . Cardiomyopathy 2006   EF 30%; has history of CHF  . CHF (congestive heart failure) ([place])   . COPD (chronic obstructive pulmonary disease) ([facility])    FEV1/FVC -> 0.52 (69% of predicted)  . Lesion of buccal mucosa    right, with non-restoable abscessed teeth  . Osteoarthritis   . Tobacco abuse     No current facility-administered medications for this encounter.   Current Outpatient Medications  Medication Sig Dispense Refill  . acetaminophen (TYLENOL) 500 MG tablet Take 500 mg by mouth 2 (two) times daily as needed (pain.).    Marland Kitchen albuterol (VENTOLIN HFA) 108 (90 Base) MCG/ACT inhaler Inhale 2 puffs into the lungs every 4 (four) hours as needed for wheezing or shortness of breath. Three month supply. 18 g 11  . amoxicillin (AMOXIL) 500 MG tablet Take 4 tablets by mouth 30-60 mins prior to your dental procedures 4 tablet 3  . aspirin EC 81 MG tablet Take 1 tablet (81 mg total) daily by mouth. 90 tablet 3  . atorvastatin (LIPITOR) 40 MG tablet TAKE 1 TABLET(40 MG) BY MOUTH DAILY AT 6 PM 90 tablet 1  . BETA CAROTENE PO Take 1 tablet by mouth 2 (two) times a week.    . budesonide-formoterol (SYMBICORT) 160-4.5 MCG/ACT inhaler Inhale 2 puffs into the lungs 2 (two) times daily. 1 Inhaler 3  . dextromethorphan-guaiFENesin (MUCINEX DM) 30-600 MG per 12 hr tablet Take 1 tablet by mouth every 12 (twelve) hours as needed for cough (mucous build up).     . furosemide (LASIX) 40 MG tablet Take 40 mg by mouth every evening.     . Ipratropium-Albuterol (COMBIVENT RESPIMAT) 20-100 MCG/ACT AERS respimat Inhale 1 puff into the  lungs every 6 (six) hours as needed for wheezing or shortness of breath.    Marland Kitchen ipratropium-albuterol (DUONEB) 0.5-2.5 (3) MG/3ML SOLN Inhale 3 mLs into the lungs every 6 (six) hours as needed (wheezing/shortness of breath.). 360 mL 3  . lisinopril (ZESTRIL) 5 MG tablet Take 5 mg by mouth daily.    . Multiple Vitamin (MULTIVITAMIN WITH MINERALS) TABS tablet Take 1 tablet by mouth every other day. Alive    . potassium chloride (KLOR-CON) 10 MEQ CR tablet Take 10 mEq by mouth at bedtime.     Marland Kitchen tiZANidine (ZANAFLEX) 4 MG tablet Take 4 mg by mouth See admin instructions. Take 1 tablet (4 mg) by mouth scheduled at bedtime daily, may take an additional dose during the day if needed for spasms.  2  . Vitamin D, Ergocalciferol, (DRISDOL) 1.25 MG (50000 UT) CAPS capsule Take 5,000 Units by mouth every Monday.     No Known Allergies Active Problems:   * No active hospital problems. *  Vitals: There were no vitals taken for this visit. Lab results:No results found for this or any previous visit (from the past 81 hour(s)). Radiology Results: No results found. General appearance: alert, cooperative and no distress Head: Normocephalic, without obvious abnormality, atraumatic Eyes: negative Nose: Nares normal. Septum midline. Mucosa normal. No drainage or sinus tenderness. Throat: Multiple carious  teeth. 38mm x 71mm pink sessile lesion right buccal mucosa. No purulence, fdluctuance, trismus. Pharynx clear. Neck: no adenopathy and supple, symmetrical, trachea midline Resp: clear to auscultation bilaterally Cardio: regular rate and rhythm, S1, S2 normal, no murmur, click, rub or gallop  Assessment: Multiple non-restorable teeth. Buccal fibroma.  Plan: Extractions with alveoloplasty. Remove buccal fibroma. GA. Day surgery.   Diona Browner 07/21/2020

## 2020-07-28 ENCOUNTER — Other Ambulatory Visit (HOSPITAL_COMMUNITY)
Admission: RE | Admit: 2020-07-28 | Discharge: 2020-07-28 | Disposition: A | Discharge status: Home / Self Care | Payer: Medicare Other | Source: Ambulatory Visit | Attending: Oral Surgery | Admitting: Oral Surgery

## 2020-07-28 DIAGNOSIS — Z01812 Encounter for preprocedural laboratory examination: Secondary | ICD-10-CM | POA: Insufficient documentation

## 2020-07-28 DIAGNOSIS — Z20822 Contact with and (suspected) exposure to covid-19: Secondary | ICD-10-CM | POA: Diagnosis not present

## 2020-07-28 LAB — SARS CORONAVIRUS 2 (TAT 6-24 HRS): SARS Coronavirus 2: NEGATIVE

## 2020-07-30 ENCOUNTER — Other Ambulatory Visit: Payer: Self-pay

## 2020-07-30 ENCOUNTER — Encounter (HOSPITAL_COMMUNITY): Payer: Self-pay | Admitting: Oral Surgery

## 2020-07-30 NOTE — Progress Notes (Signed)
PCP - Dr. Jeanie Cooks, E.  Cardiologist - Dr. Lizbeth Bark  Chest x-ray - 04/29/20 (E)  EKG - 02/28/20 (E)  Stress Test - Denies  ECHO - 11/22/2018 (E)  Cardiac Cath - Denies  AICD-na PM-na LOOP-na  Sleep Study - Denies CPAP - Denies  LABS- 07/28/20: COVID 07/31/20: CBC, BMP  ASA- Denies  ERAS- No  HA1C- Denies   Anesthesia- Yes- cardiac history- PA Ebony Hail notifited  Pt denies having chest pain, sob, or fever during the pre-op phone call. All instructions explained to the pt, with a verbal understanding including: as of today, stop taking all Aspirin (unless instructed by your doctor) and Other Aspirin containing products, Vitamins, Fish oils, and Herbal medications. Also stop all NSAIDS i.e. Advil, Ibuprofen, Motrin, Aleve, Anaprox, Naproxen, BC, Goody Powders, and all Supplements. Pt also instructed to self quarantine after being tested for COVID-19. The opportunity to ask questions was provided.   Coronavirus Screening  Have you experienced the following symptoms:  Cough yes/no: No Fever (>100.37F)  yes/no: No Runny nose yes/no: No Sore throat yes/no: No Difficulty breathing/shortness of breath  yes/no: No  Have you or a family member traveled in the last 14 days and where? yes/no: No   If the patient indicates "YES" to the above questions, their PAT will be rescheduled to limit the exposure to others and, the surgeon will be notified. THE PATIENT WILL NEED TO BE ASYMPTOMATIC FOR 14 DAYS.   If the patient is not experiencing any of these symptoms, the PAT nurse will instruct them to NOT bring anyone with them to their appointment since they may have these symptoms or traveled as well.   Please remind your patients and families that hospital visitation restrictions are in effect and the importance of the restrictions.

## 2020-07-30 NOTE — Anesthesia Preprocedure Evaluation (Addendum)
Anesthesia Evaluation  Patient identified by MRN, date of birth, ID band Patient awake    Reviewed: Allergy & Precautions, NPO status , Patient's Chart, lab work & pertinent test results  History of Anesthesia Complications Negative for: history of anesthetic complications  Airway Mallampati: I  TM Distance: >3 FB Neck ROM: Full    Dental  (+) Poor Dentition, Loose, Dental Advisory Given, Chipped, Missing   Pulmonary COPD (rarely uses Home O2),  COPD inhaler, former smoker,  07/28/2020 SARS coronavirus NEG   breath sounds clear to auscultation       Cardiovascular hypertension, Pt. on medications (-) angina+ CAD, + CABG and + Peripheral Vascular Disease (asc thoracic aortic aneurysm 5.1 cm)  + Valvular Problems/Murmurs (S/P bioprosthetic AVR)  Rhythm:Regular Rate:Normal  '20 ECHO: EF 55-60%, mild LVH, no significant valvular findings, no AI   Neuro/Psych negative neurological ROS     GI/Hepatic negative GI ROS, (+)     substance abuse (remote history)  ,   Endo/Other  negative endocrine ROS  Renal/GU negative Renal ROS     Musculoskeletal  (+) Arthritis ,   Abdominal   Peds  Hematology  (+) Blood dyscrasia (Hb 8.5), anemia ,   Anesthesia Other Findings   Reproductive/Obstetrics                           Anesthesia Physical Anesthesia Plan  ASA: III  Anesthesia Plan: General   Post-op Pain Management:    Induction: Intravenous  PONV Risk Score and Plan: 2 and Ondansetron and Dexamethasone  Airway Management Planned: Nasal ETT  Additional Equipment: None  Intra-op Plan:   Post-operative Plan: Extubation in OR  Informed Consent: I have reviewed the patients History and Physical, chart, labs and discussed the procedure including the risks, benefits and alternatives for the proposed anesthesia with the patient or authorized representative who has indicated his/her understanding  and acceptance.     Dental advisory given  Plan Discussed with: CRNA and Surgeon  Anesthesia Plan Comments: (See PAT note written 07/30/2020 by Myra Gianotti, PA-C. )      Anesthesia Quick Evaluation

## 2020-07-30 NOTE — Progress Notes (Signed)
Anesthesia Chart Review: [name] [name]   Case: [id number] Date/Time: 07/31/20 0715   Procedure: DENTAL RESTORATION/EXTRACTIONS (Bilateral )   Anesthesia type: General   Pre-op diagnosis: NONRESTORABLE   Location: [facility] OR ROOM 05 / [place] OR   Surgeons: Diona Browner, DDS      DISCUSSION: Patient is a 74 year old male scheduled for the above procedure.   History includes former smoker (177 pack years; quit 10/04/15), COPD, CAD (s/p CABG: SVG-RCA combined with AVR 01/18/06), severe aortic stenosis (s/p bioprosthetic AVR 01/18/06 combined with 1V CABG), ascending thoracic aortic aneurysm/TAA (5.1 cm 06/17/20), cardiomyopathy, chronic systolic CHF (EF 26-83% 01/1961, 55-60% 11/2018), HLD, prior polysubstance abuse (including cocaine, denies use since CABG/AVR).  He is up-to-date with cardiology, pulmonology, and CT surgery follow-up (See below PROVIDERS).   He is a same day work-up, so he will need labs on arrival and anesthesia team evaluation on the day of surgery. Has TAA, so needs careful BP monitoring perioperatively. Has bioprosthetic AVR. 07/28/20 COVID test negative.   VS:  BP Readings from Last 3 Encounters:  06/23/20 (!) 160/80  06/18/20 140/80  04/29/20 (!) 136/68   Pulse Readings from Last 3 Encounters:  06/23/20 61  06/18/20 83  04/29/20 82    PROVIDERS: Nolene Ebbs, MD is PCP  - Dorris Carnes, MD is cardiologist. Last visit 02/28/20. No angina. Continue to follow AV disease periodically. Planned follow-up in ~ Jan/Feb 2022. Modesto Charon, MD is CT surgeon. Last visit 06/23/20. Stable CT at that time with six month follow-up planned. He advised home BP monitoring and notifying if SBP > 140.  - Lenice Llamas, MD is pulmonologist. Last visit 06/18/20. Pulmonary rehab discussed, although patient still wanting to think about it. Four month follow-up planned.    LABS: He will need updated labs prior to surgery.    PFTs 06/18/20: FVC 2.43 (68%), post 2.67 (75%). FEV1 0.87  (34%), post 1.02 (39%). DLCO 8.69 (39%). As outlined by Dr. Shearon Stalls, "Pulmonary function testing sept 2021 demonstrates very severe airflow limitation with an FEV1 of 34% and positive BD response. severely reduced DLCO and lung volumes show hyperinflation consistent with emphysema."   IMAGES: CTA Chest 06/17/20: IMPRESSION: 1. 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 Unchanged enlargement of the sinuses of Valsalva, measuring up to 5.1 cm. Ascending thoracic aortic aneurysm. Recommend semi-annual imaging followup by CTA or MRA and referral to cardiothoracic surgery if not already obtained. This recommendation follows 2010 ACCF/AHA/AATS/ACR/ASA/SCA/SCAI/SIR/STS/SVM Guidelines for the Diagnosis and Management of Patients With Thoracic Aortic Disease. Circulation. 2010; 121: I297-L892. Aortic aneurysm NOS (ICD10-I71.9) Aortic Atherosclerosis (ICD10-I70.0). 2. Aortic valve prosthesis. 3. Emphysema and diffuse bilateral bronchial wall thickening. Emphysema (ICD10-J43.9). 4. Coronary artery disease.   EKG: 02/28/20 ([facility]): SR, LAD, non-specific intraventricular block (QRS 144 ms), 1st degree AV block, anterior infarct (old).   CV: Echo 11/22/18: IMPRESSIONS 1. The left ventricle has normal systolic function, with an ejection fraction of 55-60%. The cavity size was normal. There is mildly increased left ventricular wall thickness. Left ventricular diastolic Doppler parameters are consistent with impaired  relaxation. 2. The right ventricle has normal systolic function. The cavity was normal. There is no increase in right ventricular wall thickness. 3. Left atrial size was mildly dilated. 4. Right atrial size was mildly dilated. 5. The mitral valve is normal in structure. 6. The tricuspid valve is normal in structure. 7. The aortic valve is normal in structure. Moderate thickening of the aortic valve. Moderate  calcification of the aortic valve. Aortic valve regurgitation was not visualized by color flow Doppler.  AV Area (Vmax):  1.33 cm  AV Area (Vmean):  1.40 cm  AV Area (VTI):   1.41 cm  AV Vmax:      271.00 cm/s  AV Vmean:     183.000 cm/s  AV VTI:      0.707 m  AV Peak Grad:   29.4 mmHg  AV Mean Grad:   16.0 mmHg  8. There is moderate dilatation of the ascending aorta. 9. The interatrial septum was not assessed.    Past Medical History:  Diagnosis Date  . Aortic stenosis    s/p bioprosthetic valve replacement  . Ascending aortic aneurysm ([place])   . CAD (coronary artery disease) of bypass graft    s/p SVG to dRCA  . Cardiomyopathy 2006   EF 30%; has history of CHF  . CHF (congestive heart failure) ([place])   . COPD (chronic obstructive pulmonary disease) ([facility])    FEV1/FVC -> 0.52 (69% of predicted)  . Lesion of buccal mucosa    right, with non-restoable abscessed teeth  . Osteoarthritis   . Tobacco abuse     Past Surgical History:  Procedure Laterality Date  . AORTIC VALVE REPLACEMENT     bioprosthetic, 01/2006 - cannot find record of surgery (has scar)  . COLONOSCOPY N/A 01/01/2017   Procedure: COLONOSCOPY;  Surgeon: Laurence Spates, MD;  Location: [facility] ENDOSCOPY;  Service: Endoscopy;  Laterality: N/A;  . ESOPHAGOGASTRODUODENOSCOPY N/A 01/01/2017   Procedure: ESOPHAGOGASTRODUODENOSCOPY (EGD);  Surgeon: Laurence Spates, MD;  Location: [facility] ENDOSCOPY;  Service: Endoscopy;  Laterality: N/A;  . TONSILLECTOMY      MEDICATIONS: No current facility-administered medications for this encounter.   Marland Kitchen acetaminophen (TYLENOL) 500 MG tablet  . albuterol (VENTOLIN HFA) 108 (90 Base) MCG/ACT inhaler  . atorvastatin (LIPITOR) 40 MG tablet  . BETA CAROTENE PO  . budesonide-formoterol (SYMBICORT) 160-4.5 MCG/ACT inhaler  . dextromethorphan-guaiFENesin (MUCINEX DM) 30-600 MG per 12 hr tablet  . furosemide (LASIX) 40 MG tablet  . Histamine Dihydrochloride (AUSTRALIAN  DREAM ARTHRITIS) 0.025 % CREA  . Ipratropium-Albuterol (COMBIVENT RESPIMAT) 20-100 MCG/ACT AERS respimat  . ipratropium-albuterol (DUONEB) 0.5-2.5 (3) MG/3ML SOLN  . lisinopril (ZESTRIL) 5 MG tablet  . Multiple Vitamin (MULTIVITAMIN WITH MINERALS) TABS tablet  . potassium chloride (KLOR-CON) 10 MEQ CR tablet  . Vitamin D, Ergocalciferol, (DRISDOL) 1.25 MG (50000 UT) CAPS capsule  . amoxicillin (AMOXIL) 500 MG tablet    Myra Gianotti, PA-C Surgical Short Stay/Anesthesiology [facility] Phone [phone] [facility] Phone [phone] 07/30/2020 10:11 AM

## 2020-07-31 ENCOUNTER — Encounter (HOSPITAL_COMMUNITY): Payer: Self-pay | Admitting: Oral Surgery

## 2020-07-31 ENCOUNTER — Encounter (HOSPITAL_COMMUNITY)
Admission: RE | Disposition: A | Discharge status: Home / Self Care | Payer: Self-pay | Source: Home / Self Care | Attending: Oral Surgery

## 2020-07-31 ENCOUNTER — Ambulatory Visit (HOSPITAL_COMMUNITY)
Admission: RE | Admit: 2020-07-31 | Discharge: 2020-07-31 | Disposition: A | Discharge status: Home / Self Care | Payer: Medicare Other | Attending: Oral Surgery | Admitting: Oral Surgery

## 2020-07-31 ENCOUNTER — Ambulatory Visit (HOSPITAL_COMMUNITY): Payer: Medicare Other | Admitting: Vascular Surgery

## 2020-07-31 ENCOUNTER — Other Ambulatory Visit: Payer: Self-pay

## 2020-07-31 DIAGNOSIS — K029 Dental caries, unspecified: Secondary | ICD-10-CM | POA: Diagnosis present

## 2020-07-31 DIAGNOSIS — K011 Impacted teeth: Secondary | ICD-10-CM | POA: Diagnosis not present

## 2020-07-31 DIAGNOSIS — D1039 Benign neoplasm of other parts of mouth: Secondary | ICD-10-CM | POA: Insufficient documentation

## 2020-07-31 HISTORY — DX: Gastro-esophageal reflux disease without esophagitis: K21.9

## 2020-07-31 HISTORY — PX: TOOTH EXTRACTION: SHX859

## 2020-07-31 HISTORY — DX: Essential (primary) hypertension: I10

## 2020-07-31 LAB — CBC
HCT: 32.3 % — ABNORMAL LOW (ref 39.0–52.0)
Hemoglobin: 8.5 g/dL — ABNORMAL LOW (ref 13.0–17.0)
MCH: 19 pg — ABNORMAL LOW (ref 26.0–34.0)
MCHC: 26.3 g/dL — ABNORMAL LOW (ref 30.0–36.0)
MCV: 72.3 fL — ABNORMAL LOW (ref 80.0–100.0)
Platelets: 246 10*3/uL (ref 150–400)
RBC: 4.47 MIL/uL (ref 4.22–5.81)
RDW: 17.7 % — ABNORMAL HIGH (ref 11.5–15.5)
WBC: 7.7 10*3/uL (ref 4.0–10.5)
nRBC: 0 % (ref 0.0–0.2)

## 2020-07-31 LAB — BASIC METABOLIC PANEL
Anion gap: 11 (ref 5–15)
BUN: 13 mg/dL (ref 8–23)
CO2: 23 mmol/L (ref 22–32)
Calcium: 9 mg/dL (ref 8.9–10.3)
Chloride: 104 mmol/L (ref 98–111)
Creatinine, Ser: 1.01 mg/dL (ref 0.61–1.24)
GFR, Estimated: >60 mL/min (ref >60)
Glucose, Bld: 104 mg/dL — ABNORMAL HIGH (ref 70–99)
Potassium: 3.6 mmol/L (ref 3.5–5.1)
Sodium: 138 mmol/L (ref 135–145)

## 2020-07-31 SURGERY — DENTAL RESTORATION/EXTRACTIONS
Anesthesia: General | Laterality: Bilateral

## 2020-07-31 MED ORDER — ORAL CARE MOUTH RINSE: 15.0000 mL | Freq: Once | OROMUCOSAL | Status: AC

## 2020-07-31 MED ORDER — ONDANSETRON HCL 4 MG/2ML IJ SOLN
INTRAMUSCULAR | Status: DC | PRN
  Administered 2020-07-31: 4 mg via INTRAVENOUS

## 2020-07-31 MED ORDER — OXYMETAZOLINE HCL 0.05 % NA SOLN
NASAL | Status: AC
  Filled 2020-07-31: qty 30

## 2020-07-31 MED ORDER — LIDOCAINE 2% (20 MG/ML) 5 ML SYRINGE
INTRAMUSCULAR | Status: DC | PRN
  Administered 2020-07-31: 20 mg via INTRAVENOUS

## 2020-07-31 MED ORDER — LACTATED RINGERS IV SOLN: INTRAVENOUS | Status: DC

## 2020-07-31 MED ORDER — ACETAMINOPHEN 500 MG PO TABS
ORAL_TABLET | ORAL | Status: AC
  Filled 2020-07-31: qty 2

## 2020-07-31 MED ORDER — PROPOFOL 10 MG/ML IV BOLUS
INTRAVENOUS | Status: AC
  Filled 2020-07-31: qty 20

## 2020-07-31 MED ORDER — 0.9 % SODIUM CHLORIDE (POUR BTL) OPTIME
TOPICAL | Status: DC | PRN
  Administered 2020-07-31: 1000 mL

## 2020-07-31 MED ORDER — FENTANYL CITRATE (PF) 250 MCG/5ML IJ SOLN
INTRAMUSCULAR | Status: AC
  Filled 2020-07-31: qty 5

## 2020-07-31 MED ORDER — HYDROCODONE-ACETAMINOPHEN 5-325 MG PO TABS: 1.0000 | ORAL_TABLET | Freq: Four times a day (QID) | ORAL | 0 refills | Status: DC | PRN

## 2020-07-31 MED ORDER — MIDAZOLAM HCL 2 MG/2ML IJ SOLN
INTRAMUSCULAR | Status: AC
  Filled 2020-07-31: qty 2

## 2020-07-31 MED ORDER — DEXAMETHASONE SODIUM PHOSPHATE 10 MG/ML IJ SOLN
INTRAMUSCULAR | Status: DC | PRN
  Administered 2020-07-31: 5 mg via INTRAVENOUS

## 2020-07-31 MED ORDER — CHLORHEXIDINE GLUCONATE 0.12 % MT SOLN
15.0000 mL | Freq: Once | OROMUCOSAL | Status: AC
  Administered 2020-07-31: 15 mL via OROMUCOSAL
  Filled 2020-07-31: qty 15

## 2020-07-31 MED ORDER — SUGAMMADEX SODIUM 200 MG/2ML IV SOLN
INTRAVENOUS | Status: DC | PRN
  Administered 2020-07-31: 50 mg via INTRAVENOUS
  Administered 2020-07-31: 100 mg via INTRAVENOUS

## 2020-07-31 MED ORDER — MIDAZOLAM HCL 2 MG/2ML IJ SOLN
INTRAMUSCULAR | Status: DC | PRN
  Administered 2020-07-31: 1 mg via INTRAVENOUS

## 2020-07-31 MED ORDER — ROCURONIUM BROMIDE 10 MG/ML (PF) SYRINGE
PREFILLED_SYRINGE | INTRAVENOUS | Status: AC
  Filled 2020-07-31: qty 10

## 2020-07-31 MED ORDER — BUPIVACAINE HCL 0.25 % IJ SOLN
INTRAMUSCULAR | Status: DC | PRN
  Administered 2020-07-31: 14 mL

## 2020-07-31 MED ORDER — LIDOCAINE HCL (PF) 2 % IJ SOLN
INTRAMUSCULAR | Status: DC | PRN
  Administered 2020-07-31: 5 mL via INTRADERMAL

## 2020-07-31 MED ORDER — CLINDAMYCIN PHOSPHATE 600 MG/50ML IV SOLN
600.0000 mg | INTRAVENOUS | Status: AC
  Administered 2020-07-31: 600 mg via INTRAVENOUS
  Filled 2020-07-31: qty 50

## 2020-07-31 MED ORDER — LIDOCAINE-EPINEPHRINE 2 %-1:100000 IJ SOLN
INTRAMUSCULAR | Status: AC
  Filled 2020-07-31: qty 1

## 2020-07-31 MED ORDER — DEXAMETHASONE SODIUM PHOSPHATE 10 MG/ML IJ SOLN
INTRAMUSCULAR | Status: AC
  Filled 2020-07-31: qty 1

## 2020-07-31 MED ORDER — ALBUTEROL SULFATE (2.5 MG/3ML) 0.083% IN NEBU
2.5000 mg | INHALATION_SOLUTION | Freq: Four times a day (QID) | RESPIRATORY_TRACT | Status: DC | PRN
  Administered 2020-07-31: 2.5 mg via RESPIRATORY_TRACT

## 2020-07-31 MED ORDER — SODIUM CHLORIDE 0.9 % IR SOLN
Status: DC | PRN
  Administered 2020-07-31: 1000 mL

## 2020-07-31 MED ORDER — ACETAMINOPHEN 10 MG/ML IV SOLN
INTRAVENOUS | Status: AC
  Filled 2020-07-31: qty 100

## 2020-07-31 MED ORDER — PROPOFOL 10 MG/ML IV BOLUS
INTRAVENOUS | Status: DC | PRN
  Administered 2020-07-31: 100 mg via INTRAVENOUS

## 2020-07-31 MED ORDER — BUPIVACAINE HCL (PF) 0.25 % IJ SOLN
INTRAMUSCULAR | Status: AC
  Filled 2020-07-31: qty 30

## 2020-07-31 MED ORDER — LIDOCAINE 2% (20 MG/ML) 5 ML SYRINGE
INTRAMUSCULAR | Status: AC
  Filled 2020-07-31: qty 5

## 2020-07-31 MED ORDER — ROCURONIUM BROMIDE 10 MG/ML (PF) SYRINGE
PREFILLED_SYRINGE | INTRAVENOUS | Status: DC | PRN
  Administered 2020-07-31: 60 mg via INTRAVENOUS

## 2020-07-31 MED ORDER — ACETAMINOPHEN 500 MG PO TABS
1000.0000 mg | ORAL_TABLET | Freq: Once | ORAL | Status: AC
  Administered 2020-07-31: 1000 mg via ORAL
  Filled 2020-07-31: qty 2

## 2020-07-31 MED ORDER — AMOXICILLIN 500 MG PO CAPS: 500.0000 mg | ORAL_CAPSULE | Freq: Three times a day (TID) | ORAL | 0 refills | Status: DC

## 2020-07-31 MED ORDER — OXYMETAZOLINE HCL 0.05 % NA SOLN
NASAL | Status: DC | PRN
  Administered 2020-07-31: 1

## 2020-07-31 MED ORDER — ALBUTEROL SULFATE (2.5 MG/3ML) 0.083% IN NEBU
INHALATION_SOLUTION | RESPIRATORY_TRACT | Status: AC
  Filled 2020-07-31: qty 3

## 2020-07-31 MED ORDER — ONDANSETRON HCL 4 MG/2ML IJ SOLN
INTRAMUSCULAR | Status: AC
  Filled 2020-07-31: qty 2

## 2020-07-31 SURGICAL SUPPLY — 30 items
BLADE SURG 15 STRL LF DISP TIS (BLADE) ×1 IMPLANT
BLADE SURG 15 STRL SS (BLADE) ×2
BUR CROSS CUT FISSURE 1.6 (BURR) ×2 IMPLANT
BUR EGG ELITE 4.0 (BURR) ×2 IMPLANT
CANISTER SUCT 3000ML PPV (MISCELLANEOUS) ×2 IMPLANT
COVER SURGICAL LIGHT HANDLE (MISCELLANEOUS) ×2 IMPLANT
DECANTER SPIKE VIAL GLASS SM (MISCELLANEOUS) ×2 IMPLANT
DRAPE U-SHAPE 76X120 STRL (DRAPES) ×2 IMPLANT
GAUZE PACKING FOLDED 2  STR (GAUZE/BANDAGES/DRESSINGS) ×2
GAUZE PACKING FOLDED 2 STR (GAUZE/BANDAGES/DRESSINGS) ×1 IMPLANT
GLOVE BIO SURGEON STRL SZ 6.5 (GLOVE) ×1 IMPLANT
GLOVE BIO SURGEON STRL SZ8 (GLOVE) ×2 IMPLANT
GOWN STRL REUS W/ TWL LRG LVL3 (GOWN DISPOSABLE) ×1 IMPLANT
GOWN STRL REUS W/ TWL XL LVL3 (GOWN DISPOSABLE) ×1 IMPLANT
GOWN STRL REUS W/TWL LRG LVL3 (GOWN DISPOSABLE) ×2
GOWN STRL REUS W/TWL XL LVL3 (GOWN DISPOSABLE) ×2
IV NS 1000ML (IV SOLUTION) ×2
IV NS 1000ML BAXH (IV SOLUTION) ×1 IMPLANT
KIT BASIN OR (CUSTOM PROCEDURE TRAY) ×2 IMPLANT
KIT TURNOVER KIT B (KITS) ×2 IMPLANT
NDL HYPO 25GX1X1/2 BEV (NEEDLE) ×2 IMPLANT
NEEDLE HYPO 25GX1X1/2 BEV (NEEDLE) ×4 IMPLANT
NS IRRIG 1000ML POUR BTL (IV SOLUTION) ×2 IMPLANT
PAD ARMBOARD 7.5X6 YLW CONV (MISCELLANEOUS) ×2 IMPLANT
SLEEVE IRRIGATION ELITE 7 (MISCELLANEOUS) ×2 IMPLANT
SUT CHROMIC 3 0 PS 2 (SUTURE) ×3 IMPLANT
SYR CONTROL 10ML LL (SYRINGE) ×2 IMPLANT
TRAY ENT MC OR (CUSTOM PROCEDURE TRAY) ×2 IMPLANT
TUBING IRRIGATION (MISCELLANEOUS) ×2 IMPLANT
YANKAUER SUCT BULB TIP NO VENT (SUCTIONS) ×2 IMPLANT

## 2020-07-31 NOTE — Op Note (Signed)
07/31/2020  8:42 AM  PATIENT:  [name] [name]  74 y.o. male  PRE-OPERATIVE DIAGNOSIS:  NONRESTORABLE TEETH # 4, 5, 6, 18, 19, 20, 21, 28, 29, IMPACTED TOOTH #17; LESION RIGHT BUCCAL MUCOSA  POST-OPERATIVE DIAGNOSIS:  SAME  PROCEDURE:  Procedure(s): EXTRACTION TEETH # 4, 5, 6, 17,18, 19, 20, 21, 28, 29, ALVOLOPLASTY LEFT MANDIBLE, RIGHT MAXILLA, REMOVAL FIBROMA    SURGEON:  Surgeon(s): Diona Browner, DDS  ANESTHESIA:   local and general  EBL:  minimal  DRAINS: none   SPECIMEN: LESION RIGHT ORAL BUCCAL MUCOSA, PRELIM DX FIBROMA  COUNTS:  YES  PLAN OF CARE: Discharge to home after PACU  PATIENT DISPOSITION:  PACU - hemodynamically stable.   PROCEDURE DETAILS: Dictation # [id number]  Gae Bon, DMD 07/31/2020 8:42 AM

## 2020-07-31 NOTE — Transfer of Care (Signed)
Immediate Anesthesia Transfer of Care Note  Patient: [name] [name]  Procedure(s) Performed: DENTAL RESTORATION/EXTRACTIONS (Bilateral )  Patient Location: PACU  Anesthesia Type:General  Level of Consciousness: awake, patient cooperative and responds to stimulation  Airway & Oxygen Therapy: Patient Spontanous Breathing and Patient connected to face mask oxygen  Post-op Assessment: Report given to RN, Post -op Vital signs reviewed and stable and Patient moving all extremities X 4  Post vital signs: Reviewed and stable  Last Vitals:  Vitals Value Taken Time  BP    Temp    Pulse    Resp    SpO2      Last Pain:  Vitals:   07/31/20 0623  TempSrc:   PainSc: 10-Worst pain ever      Patients Stated Pain Goal: 3 (34/28/76 8115)  Complications: No complications documented.

## 2020-07-31 NOTE — Op Note (Signed)
NAME: [name] [name], [name] [name] MEDICAL RECORD TI:[id number] ACCOUNT [id number] DATE OF BIRTH:03-27-1946 FACILITY: [facility] LOCATION: [facility]PER[facility] PHYSICIAN:Safiyyah Vasconez M. Zyonna Vardaman, DDS  OPERATIVE REPORT  DATE OF PROCEDURE:  07/31/2020  PREOPERATIVE DIAGNOSES:  Nonrestorable teeth secondary to dental caries numbers 4, 5, 6, 18, 19, 20, 21, 28, 29; impacted tooth #17 and lesion, right buccal mucosa.  POSTOPERATIVE DIAGNOSES:  Nonrestorable teeth secondary to dental caries numbers 4, 5, 6, 18, 19, 20, 21, 28, 29; impacted tooth #17 and lesion, right buccal mucosa.  PROCEDURE:  Extraction of teeth numbers 4, 5, 6, 17, 18, 19, 20, 21, 28, 29; alveoplasty left mandible, right maxilla; removal of fibroma, right buccal mucosa.  SURGEON:  Diona Browner, DDS  ANESTHESIA:  General, nasal intubation.  ATTENDING:  Annye Asa.  DESCRIPTION OF PROCEDURE:  The patient was taken to the operating room and placed on the table in supine position.  General anesthesia was administered and nasal endotracheal tube was placed and secured.  The eyes were protected and the patient was  draped for surgery.  A timeout was performed.  The posterior pharynx was suctioned and a throat pack was placed.  Marcaine, no epinephrine was infiltrated in an inferior alveolar block on the right and left sides and then buccal and palatal infiltration  in the maxilla.  A sweetheart retractor and bite block were placed in the mouth to provide access to the left side.  A 15 blade was used to make an incision overlying tooth #17 carried forward buccally and lingually around severely decayed roots of the  teeth on the lower left.  The periosteum was reflected.  The teeth were elevated numbers 19, 18, 20, 21 with the 301 elevator and removed from the mouth with the rongeurs.  Then, the sockets were curetted.  The periosteum was reflected to expose the bone  overlying tooth #17.  The Stryker handpiece with fissure bur under irrigation was used to  remove circumferential bone around the tooth.  The tooth could not be elevated and therefore, the tooth was sectioned and removed in pieces.  Then, the sockets on  the left side were curetted.  Alveoplasty with an egg-shaped bur under irrigation was performed to smooth the irregular bony contours and then the bone file was used to further smooth the area.  Then, the area was irrigated and closed with 3-0 chromic.   The bite block and sweetheart retractor were repositioned to the other side of the mouth.  A 15 blade was used to make an incision around teeth numbers 28, 29 and then around teeth numbers 4, 5 and 6.  The periosteum was reflected.  The teeth were  elevated and removed with the dental forceps.  The sockets were curetted.  The right mandible contour was good, but the right maxilla had bony prominences and irregularities, so alveoplasty was performed using the egg bur and bone file.  Then, the area  was irrigated and closed with 3-0 chromic.  Then, lidocaine 2%, 1:100,000 epinephrine was infiltrated around the lesion in the right buccal mucosa, which was close to the commissure of the lip.  A 15 blade was used to make an elliptical incision.  The  lesion was dissected with a 15 blade and then the lesion was submitted for pathology and preliminary diagnosis fibroma.  Then, the area was closed with 3-0 interrupted chromic sutures.  The oral cavity was then irrigated, inspected and found to have good  contour, hemostasis and closure.  Then, additional lidocaine with epinephrine was given in  inferior alveolar block, but there was some oozing and then the oral cavity was irrigated and suctioned and a throat pack was removed.  The patient was left in  care of anesthesia for transport to recovery and discharge home through day surgery.  ESTIMATED BLOOD LOSS:  Minimum.  COMPLICATIONS:  None.  SPECIMENS:  Fibroma, right buccal mucosa.  HN/NUANCE  D:07/31/2020 T:07/31/2020 JOB:[id number]

## 2020-07-31 NOTE — H&P (Signed)
H&P documentation  -History and Physical Reviewed  -Patient has been re-examined  -No change in the plan of care  [name] [name]  

## 2020-07-31 NOTE — Anesthesia Procedure Notes (Signed)
Procedure Name: Intubation Date/Time: 07/31/2020 7:46 AM Performed by: Michele Rockers, CRNA Pre-anesthesia Checklist: Patient identified, Patient being monitored, Timeout performed, Emergency Drugs available and Suction available Patient Re-evaluated:Patient Re-evaluated prior to induction Oxygen Delivery Method: Circle System Utilized Preoxygenation: Pre-oxygenation with 100% oxygen Induction Type: IV induction and Inhalational induction Ventilation: Mask ventilation without difficulty Laryngoscope Size: Mac, 3 and Glidescope Grade View: Grade I Nasal Tubes: Nasal prep performed, Right and Nasal Rae Tube size: 7.5 mm Number of attempts: 1 Airway Equipment and Method: Stylet Placement Confirmation: ETT inserted through vocal cords under direct vision,  positive ETCO2 and breath sounds checked- equal and bilateral Tube secured with: Tape Dental Injury: Teeth and Oropharynx as per pre-operative assessment

## 2020-07-31 NOTE — OR Nursing (Signed)
Pt is awake,alert and oriented. Pt is in NAD at this time. Pt and family verbalized understanding of poc and discharge instructions. instructions given to family and reviewed prior to discharge.Pt is ambulatory to wheelchair with stand by assist but not needed with steady gait.  Pt taken to front lobby and placed in car with family.   

## 2020-07-31 NOTE — Anesthesia Postprocedure Evaluation (Signed)
Anesthesia Post Note  Patient: [name] [name]  Procedure(s) Performed: DENTAL RESTORATION/EXTRACTIONS (Bilateral )     Patient location during evaluation: PACU Anesthesia Type: General Level of consciousness: awake and alert, patient cooperative and oriented Pain management: pain level controlled Vital Signs Assessment: post-procedure vital signs reviewed and stable Respiratory status: spontaneous breathing, nonlabored ventilation and respiratory function stable Cardiovascular status: blood pressure returned to baseline and stable Postop Assessment: no apparent nausea or vomiting Anesthetic complications: no Comments: Pt understands to use his O2 concentrator at home today   No complications documented.  Last Vitals:  Vitals:   07/31/20 0930 07/31/20 0945  BP: (!) 151/83 (!) 154/70  Pulse: 67 72  Resp: 16 16  Temp:    SpO2: 94% 96%    Last Pain:  Vitals:   07/31/20 0945  TempSrc:   PainSc: 0-No pain                 Bridgett Hattabaugh,E. Nyriah Coote

## 2020-08-01 ENCOUNTER — Encounter (HOSPITAL_COMMUNITY): Payer: Self-pay | Admitting: Oral Surgery

## 2020-08-03 LAB — SURGICAL PATHOLOGY

## 2020-08-27 ENCOUNTER — Other Ambulatory Visit: Payer: Self-pay | Admitting: Pulmonary Disease

## 2020-08-31 ENCOUNTER — Other Ambulatory Visit: Payer: Self-pay | Admitting: Pulmonary Disease

## 2020-10-27 ENCOUNTER — Encounter (HOSPITAL_COMMUNITY): Payer: Self-pay | Admitting: *Deleted

## 2020-11-06 ENCOUNTER — Other Ambulatory Visit: Payer: Self-pay | Admitting: *Deleted

## 2020-11-06 DIAGNOSIS — I712 Thoracic aortic aneurysm, without rupture, unspecified: Secondary | ICD-10-CM

## 2020-11-06 NOTE — Progress Notes (Unsigned)
cta

## 2020-11-30 ENCOUNTER — Other Ambulatory Visit: Payer: Self-pay | Admitting: Internal Medicine

## 2020-12-01 LAB — LIPID PANEL
Cholesterol: 161 mg/dL (ref <200)
HDL: 73 mg/dL (ref >=40)
LDL Cholesterol (Calc): 73 mg/dL (calc)
Non-HDL Cholesterol (Calc): 88 mg/dL (calc) (ref <130)
Total CHOL/HDL Ratio: 2.2 (calc) (ref <5.0)
Triglycerides: 69 mg/dL (ref <150)

## 2020-12-01 LAB — CBC
HCT: 34.1 % — ABNORMAL LOW (ref 38.5–50.0)
Hemoglobin: 9.6 g/dL — ABNORMAL LOW (ref 13.2–17.1)
MCH: 20.2 pg — ABNORMAL LOW (ref 27.0–33.0)
MCHC: 28.2 g/dL — ABNORMAL LOW (ref 32.0–36.0)
MCV: 71.6 fL — ABNORMAL LOW (ref 80.0–100.0)
Platelets: 238 10*3/uL (ref 140–400)
RBC: 4.76 10*6/uL (ref 4.20–5.80)
RDW: 17.6 % — ABNORMAL HIGH (ref 11.0–15.0)
WBC: 8.4 10*3/uL (ref 3.8–10.8)

## 2020-12-01 LAB — COMPLETE METABOLIC PANEL WITH GFR
AG Ratio: 1.6 (calc) (ref 1.0–2.5)
ALT: 12 U/L (ref 9–46)
AST: 16 U/L (ref 10–35)
Albumin: 4.2 g/dL (ref 3.6–5.1)
Alkaline phosphatase (APISO): 41 U/L (ref 35–144)
BUN: 13 mg/dL (ref 7–25)
CO2: 26 mmol/L (ref 20–32)
Calcium: 8.8 mg/dL (ref 8.6–10.3)
Chloride: 99 mmol/L (ref 98–110)
Creat: 1.06 mg/dL (ref 0.70–1.18)
GFR, Est African American: 80 mL/min/{1.73_m2} (ref >=60)
GFR, Est Non African American: 69 mL/min/{1.73_m2} (ref >=60)
Globulin: 2.6 g/dL (calc) (ref 1.9–3.7)
Glucose, Bld: 75 mg/dL (ref 65–99)
Potassium: 4.6 mmol/L (ref 3.5–5.3)
Sodium: 138 mmol/L (ref 135–146)
Total Bilirubin: 0.4 mg/dL (ref 0.2–1.2)
Total Protein: 6.8 g/dL (ref 6.1–8.1)

## 2020-12-01 LAB — TSH: TSH: 1.31 mIU/L (ref 0.40–4.50)

## 2020-12-14 ENCOUNTER — Other Ambulatory Visit: Payer: Self-pay | Admitting: Pulmonary Disease

## 2020-12-14 DIAGNOSIS — J432 Centrilobular emphysema: Secondary | ICD-10-CM

## 2020-12-15 ENCOUNTER — Inpatient Hospital Stay: Admission: RE | Admit: 2020-12-15 | Payer: Medicare Other | Source: Ambulatory Visit

## 2020-12-22 ENCOUNTER — Ambulatory Visit: Payer: Medicare Other | Admitting: Thoracic Surgery (Cardiothoracic Vascular Surgery)

## 2020-12-29 ENCOUNTER — Ambulatory Visit
Admission: RE | Admit: 2020-12-29 | Discharge: 2020-12-29 | Disposition: A | Discharge status: Home / Self Care | Payer: Medicare Other | Source: Ambulatory Visit | Attending: Thoracic Surgery (Cardiothoracic Vascular Surgery) | Admitting: Thoracic Surgery (Cardiothoracic Vascular Surgery)

## 2020-12-29 ENCOUNTER — Other Ambulatory Visit: Payer: Self-pay

## 2020-12-29 DIAGNOSIS — I712 Thoracic aortic aneurysm, without rupture, unspecified: Secondary | ICD-10-CM

## 2020-12-29 MED ORDER — IOPAMIDOL (ISOVUE-370) INJECTION 76%
75.0000 mL | Freq: Once | INTRAVENOUS | Status: AC | PRN
  Administered 2020-12-29: 75 mL via INTRAVENOUS

## 2021-01-01 ENCOUNTER — Other Ambulatory Visit: Payer: Self-pay | Admitting: Internal Medicine

## 2021-01-01 DIAGNOSIS — J432 Centrilobular emphysema: Secondary | ICD-10-CM

## 2021-01-05 ENCOUNTER — Other Ambulatory Visit: Payer: Self-pay

## 2021-01-05 ENCOUNTER — Other Ambulatory Visit: Payer: Self-pay | Admitting: Thoracic Surgery (Cardiothoracic Vascular Surgery)

## 2021-01-05 ENCOUNTER — Ambulatory Visit (INDEPENDENT_AMBULATORY_CARE_PROVIDER_SITE_OTHER): Payer: Medicare Other | Admitting: Thoracic Surgery (Cardiothoracic Vascular Surgery)

## 2021-01-05 VITALS — BP 123/78 | HR 72 | Resp 20 | Wt 163.0 lb

## 2021-01-05 DIAGNOSIS — I712 Thoracic aortic aneurysm, without rupture, unspecified: Secondary | ICD-10-CM

## 2021-01-05 NOTE — Progress Notes (Signed)
[address]       [place] [address]             [phone]     HPI: 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  He underwent AVR with a bioprosthetic valve in 2007.  He was found to have an aortic root aneurysm on echo in 2016.  He has been followed since then.  He also has had some prosthetic valvular stenosis.  His last echo was in February 2020.  He continues to have some shortness of breath with exertion.  It varies from day-to-day and overall he is better than he was a year ago.  He no longer has orthopnea.  He quit smoking in 2017.  He denies any peripheral edema.  He was initially on 10 mg a day of lisinopril.  That was decreased to 5 mg a day due to dizziness.  He currently is back to taking 10 mg daily and tolerating that well.  Past Medical History:  Diagnosis Date  . Aortic stenosis   . Aortic stenosis    s/p bioprosthetic valve replacement  . Arthritis   . Ascending aortic aneurysm ([facility])   . CAD (coronary artery disease) of bypass graft    s/p SVG to dRCA  . Cardiomyopathy 2006   EF 30%; has history of CHF  . CHF (congestive heart failure) ([facility])   . COPD (chronic obstructive pulmonary disease) ([facility])   . COPD (chronic obstructive pulmonary disease) ([facility])    FEV1/FVC -> 0.52 (69% of predicted)  . Dyspnea    HX: COPD  . GERD (gastroesophageal reflux disease)   . Heart murmur   . Hypertension   . Lesion of buccal mucosa    right, with non-restoable abscessed teeth  . Osteoarthritis   . Tobacco abuse   . Wears glasses      Current Outpatient Medications  Medication Sig Dispense Refill  . acetaminophen (TYLENOL) 500 MG tablet Take 1,000 mg by mouth every 6 (six) hours as needed for moderate  pain or headache.    Marland Kitchen acetaminophen (TYLENOL) 500 MG tablet Take 1,000 mg by mouth every 6 (six) hours as needed (pain.).     Marland Kitchen albuterol (VENTOLIN HFA) 108 (90 Base) MCG/ACT inhaler INHALE 2 PUFFS INTO THE LUNGS EVERY 4 HOURS AS NEEDED FOR WHEEZING OR SHORTNESS OF BREATH 8.5 g 5  . amoxicillin (AMOXIL) 500 MG capsule Take 1 capsule (500 mg total) by mouth 3 (three) times daily. 21 capsule 0  . aspirin EC 81 MG tablet Take 81 mg by mouth daily.    Marland Kitchen atorvastatin (LIPITOR) 40 MG tablet Take 1 tablet (40 mg total) by mouth daily. 90 tablet 0  . atorvastatin (LIPITOR) 40 MG tablet TAKE 1 TABLET(40 MG) BY MOUTH DAILY AT 6 PM (Patient taking differently: Take 40 mg by mouth daily at 6 PM.) 90 tablet 1  . BETA CAROTENE PO Take 1 tablet by mouth once a week.     Marland Kitchen BETA CAROTENE PO Take 1 tablet by mouth daily.    . budesonide-formoterol (SYMBICORT) 160-4.5 MCG/ACT inhaler Inhale 2 puffs into the lungs 2 (two) times daily.    . Carboxymethylcellul-Glycerin (LUBRICATING EYE DROPS OP) Place 1 drop into both eyes daily as needed (dry  eyes).    . dextromethorphan-guaiFENesin (MUCINEX DM) 30-600 MG per 12 hr tablet Take 1 tablet by mouth every 12 (twelve) hours as needed for cough (mucus build up).     . diphenhydramine-acetaminophen (TYLENOL PM) 25-500 MG TABS tablet Take 1 tablet by mouth at bedtime.    . furosemide (LASIX) 40 MG tablet Take one (1) tablet(s) once daily (Patient taking differently: Take 40 mg by mouth every evening.) 30 tablet 6  . furosemide (LASIX) 40 MG tablet Take 40 mg by mouth every evening.     Marland Kitchen Histamine Dihydrochloride (AUSTRALIAN DREAM ARTHRITIS) 0.025 % CREA Apply 1 application topically 4 (four) times daily as needed (pain.).    Marland Kitchen HYDROcodone-acetaminophen (NORCO) 5-325 MG tablet Take 1 tablet by mouth every 6 (six) hours as needed for moderate pain. 20 tablet 0  . Ipratropium-Albuterol (COMBIVENT) 20-100 MCG/ACT AERS respimat Inhale 1 puff into the lungs every 6 (six) hours as  needed for wheezing or shortness of breath.     . Ipratropium-Albuterol (COMBIVENT) 20-100 MCG/ACT AERS respimat Inhale 1 puff into the lungs every 6 (six) hours as needed for wheezing or shortness of breath.    Marland Kitchen ipratropium-albuterol (DUONEB) 0.5-2.5 (3) MG/3ML SOLN Take 3 mLs by nebulization 4 (four) times daily.    Marland Kitchen ipratropium-albuterol (DUONEB) 0.5-2.5 (3) MG/3ML SOLN USE 1 VIAL VIA NEBULIZER INHALATION EVERY 6 HOURS AS NEEDED FOR WHEEZING OR SHORTNESS OF BREATH 360 mL 3  . lisinopril (PRINIVIL,ZESTRIL) 5 MG tablet Take 5 mg by mouth daily.    Marland Kitchen lisinopril (ZESTRIL) 5 MG tablet Take 5 mg by mouth daily.    . Multiple Vitamin (MULTIVITAMIN WITH MINERALS) TABS tablet Take 1 tablet by mouth every other day. Alive    . Multiple Vitamin (MULTIVITAMIN WITH MINERALS) TABS tablet Take 1 tablet by mouth daily.    . potassium chloride (K-DUR,KLOR-CON) 10 MEQ tablet Take 10 mEq by mouth every evening.    . potassium chloride (KLOR-CON) 10 MEQ CR tablet Take 10 mEq by mouth at bedtime.    . Pseudoeph-Doxylamine-DM-APAP (NYQUIL PO) Take 1 Dose by mouth at bedtime as needed (cough).    . Simethicone 125 MG TABS Take 125 mg by mouth daily as needed (gas).    . SYMBICORT 160-4.5 MCG/ACT inhaler INHALE 2 PUFFS INTO THE LUNGS TWICE DAILY 10.2 g 5  . tiZANidine (ZANAFLEX) 4 MG tablet Take 4 mg by mouth 2 (two) times a day.     . trolamine salicylate (ASPERCREME) 10 % cream Apply 1 application topically as needed for muscle pain.    . Vitamin D, Ergocalciferol, (DRISDOL) 1.25 MG (50000 UT) CAPS capsule Take 5,000 Units by mouth every Monday.    . Vitamin D, Ergocalciferol, (DRISDOL) 1.25 MG (50000 UT) CAPS capsule Take 50,000 Units by mouth every Monday.     No current facility-administered medications for this visit.    Physical Exam BP 123/78 (BP Location: Right Arm, Patient Position: Sitting)   Pulse 72   Resp 20   Wt 163 lb (73.9 kg)   SpO2 91%   BMI 26.66 kg/m  75 year old man in no acute  distress Alert and oriented x3 with no focal deficits Cardiac regular rate and rhythm with a 2/6 systolic murmur Lungs diminished bilaterally, no rales or wheezing No peripheral edema  Diagnostic Tests: CT ANGIOGRAPHY CHEST WITH CONTRAST  TECHNIQUE: Multidetector CT imaging of the chest was performed using the standard protocol during bolus administration of intravenous contrast. Multiplanar CT image reconstructions and MIPs were obtained to  evaluate the vascular anatomy.  CONTRAST:  107mL ISOVUE-370 IOPAMIDOL (ISOVUE-370) INJECTION 76%  COMPARISON:  06/17/2020, 06/28/2020, 05/09/2019  FINDINGS: Cardiovascular:  Heart:  No cardiomegaly. No pericardial fluid/thickening. Surgical changes of median sternotomy and CABG, with native coronary artery calcifications of the left main, left anterior descending, circumflex, right coronary arteries.  Aorta:  Redemonstration of aortic valve repair. Greatest estimated diameter of the ascending aorta beyond the sino-tubular junction is 4.5 cm on the current CT. I would estimate that the prior most recent measurement was made at the sinuses of Valsalva. Overall, there does not seem to be change in the size or configuration.  No pedunculated plaque or ulcerated plaque. No dissection. No periaortic fluid or inflammatory changes. Mild atherosclerotic changes.  Three vessel arch with patency of the branch vessels. Cervical cerebral vessels patent at the base of the neck.  Pulmonary arteries:  Timing of the contrast bolus is not optimized for evaluation of pulmonary artery filling defects.  Mediastinum/Nodes: No mediastinal adenopathy. Small hiatal hernia with otherwise unremarkable thoracic esophagus.  Unremarkable appearance of the thoracic inlet.  Lungs/Pleura: Paraseptal/centrilobular emphysema. No pleural effusion. No confluent airspace disease.  No pneumothorax.  Upper Abdomen: No acute. Nonobstructing  stone in the left collecting system.  Musculoskeletal: No acute displaced fracture. Degenerative changes of the spine.  Review of the MIP images confirms the above findings.  IMPRESSION: Relatively unchanged size and configuration of the thoracic aorta, with the greatest estimated diameter of ascending aorta beyond the sino-tubular junction 4.5 cm on the current CT. It seems that prior measurement may have been closer to the sinuses of Valsalva. Aortic aneurysm NOS (ICD10-I71.9)  Redemonstration of median sternotomy, aortic valve repair, and CABG.  Aortic Atherosclerosis (ICD10-I70.0) and Emphysema (ICD10-J43.9). Associated native coronary artery disease.  Additional ancillary findings as above.  Signed,  Dulcy Fanny. Dellia Nims, RPVI  [facility]  [facility]   Electronically Signed   By: Corrie Mckusick D.O.   On: 12/29/2020 11:19 I personally reviewed the CT images.  He has a 4.5 cm ascending aneurysm.  At the sinuses of Valsalva it measures larger than that around 5.1 to 5.2 cm.  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  Aortic root and ascending aneurysm-measurements unchanged.  Needs continued semiannual follow-up.  Blood pressures well controlled.  Hypertension-blood pressure well controlled on current regimen of lisinopril 10 mg daily.  Prosthetic valvular stenosis-last echo was February 2020.  We will go ahead and order a follow-up echo.  We will also see if we can arrange for him to get a follow-up appointment with Dr. Harrington Challenger.  Tobacco abuse-quit smoking in 2017.  COPD-symptoms currently very well controlled.  It does tend to worsen with humidity so he is in the best time of the year for him right  now.  Plan: 2D echocardiogram Follow-up with Dr. Harrington Challenger Return in 6 months with CT angiogram of chest  [name] Nakayama, MD [facility] [phone]

## 2021-02-16 ENCOUNTER — Encounter (INDEPENDENT_AMBULATORY_CARE_PROVIDER_SITE_OTHER): Payer: Self-pay

## 2021-02-16 ENCOUNTER — Ambulatory Visit (HOSPITAL_COMMUNITY): Payer: Medicare Other | Attending: Cardiology

## 2021-02-16 ENCOUNTER — Other Ambulatory Visit: Payer: Self-pay

## 2021-02-16 ENCOUNTER — Encounter (HOSPITAL_COMMUNITY): Payer: Self-pay | Admitting: Thoracic Surgery (Cardiothoracic Vascular Surgery)

## 2021-02-18 ENCOUNTER — Other Ambulatory Visit: Payer: Self-pay | Admitting: Internal Medicine

## 2021-03-11 ENCOUNTER — Ambulatory Visit (HOSPITAL_COMMUNITY)
Admission: RE | Admit: 2021-03-11 | Discharge: 2021-03-11 | Disposition: A | Discharge status: Home / Self Care | Payer: Medicare Other | Source: Ambulatory Visit | Attending: Thoracic Surgery (Cardiothoracic Vascular Surgery) | Admitting: Thoracic Surgery (Cardiothoracic Vascular Surgery)

## 2021-03-11 ENCOUNTER — Other Ambulatory Visit: Payer: Self-pay

## 2021-03-11 DIAGNOSIS — F17210 Nicotine dependence, cigarettes, uncomplicated: Secondary | ICD-10-CM | POA: Insufficient documentation

## 2021-03-11 DIAGNOSIS — I712 Thoracic aortic aneurysm, without rupture: Secondary | ICD-10-CM | POA: Insufficient documentation

## 2021-03-11 DIAGNOSIS — I350 Nonrheumatic aortic (valve) stenosis: Secondary | ICD-10-CM | POA: Insufficient documentation

## 2021-03-11 DIAGNOSIS — I110 Hypertensive heart disease with heart failure: Secondary | ICD-10-CM | POA: Diagnosis not present

## 2021-03-11 DIAGNOSIS — J449 Chronic obstructive pulmonary disease, unspecified: Secondary | ICD-10-CM | POA: Insufficient documentation

## 2021-03-11 DIAGNOSIS — I509 Heart failure, unspecified: Secondary | ICD-10-CM | POA: Diagnosis not present

## 2021-03-11 DIAGNOSIS — I2510 Atherosclerotic heart disease of native coronary artery without angina pectoris: Secondary | ICD-10-CM | POA: Insufficient documentation

## 2021-03-11 DIAGNOSIS — R011 Cardiac murmur, unspecified: Secondary | ICD-10-CM | POA: Insufficient documentation

## 2021-03-11 NOTE — Progress Notes (Signed)
  Echocardiogram 2D Echocardiogram has been performed.  Jennette Dubin 03/11/2021, 11:14 AM

## 2021-03-12 LAB — ECHOCARDIOGRAM COMPLETE
AR max vel: 1.17 cm2
AV Area VTI: 1.24 cm2
AV Area mean vel: 1.17 cm2
AV Mean grad: 23 mmHg
AV Peak grad: 39.3 mmHg
Ao pk vel: 3.14 m/s
Area-P 1/2: 2.83 cm2
S' Lateral: 4.4 cm

## 2021-03-16 ENCOUNTER — Telehealth: Payer: Self-pay | Admitting: *Deleted

## 2021-03-16 NOTE — Telephone Encounter (Signed)
-----   Message from [place], MD sent at 03/16/2021  9:07 AM EDT ----- Reviewed echo  (it was not in my in basket) Difficult acoustic windows  Aorta difficult to see but pt had CT scan which was better test Pumping function without significant change    LVEF approximately 50% AV prosthesis is difficult to see  May be a little narrower than previous       This echo was ordered by [name].   I last saw pt in 2021   Please have him added to my sched the next time I am in [place]

## 2021-03-18 ENCOUNTER — Telehealth: Payer: Self-pay | Admitting: Physician Assistant

## 2021-03-18 NOTE — Telephone Encounter (Signed)
The pt has an appointment on 6/21 in [place] with APP but most recent phone note from Dr. Harrington Challenger - Dr. Harrington Challenger requests f/u with her the next time she is in [place].  Will route to [place] office to see if they can accommodate.

## 2021-03-18 NOTE — Telephone Encounter (Signed)
New message:    Patient  to see if he can get VV for his apt patient can not wear a mask.

## 2021-03-18 NOTE — Telephone Encounter (Signed)
Pt is a [place] pt.  Per Dr. Harrington Challenger ok to wait until July to see her in [place].  Will call pt and arrange.

## 2021-03-19 ENCOUNTER — Other Ambulatory Visit: Payer: Self-pay | Admitting: Internal Medicine

## 2021-03-21 ENCOUNTER — Other Ambulatory Visit: Payer: Self-pay | Admitting: Internal Medicine

## 2021-03-21 DIAGNOSIS — J432 Centrilobular emphysema: Secondary | ICD-10-CM

## 2021-03-23 ENCOUNTER — Ambulatory Visit: Payer: Medicare Other | Admitting: Physician Assistant

## 2021-03-25 ENCOUNTER — Other Ambulatory Visit: Payer: Self-pay

## 2021-03-25 ENCOUNTER — Encounter: Payer: Self-pay | Admitting: Internal Medicine

## 2021-03-25 ENCOUNTER — Telehealth (INDEPENDENT_AMBULATORY_CARE_PROVIDER_SITE_OTHER): Payer: Medicare Other | Admitting: Internal Medicine

## 2021-03-25 ENCOUNTER — Telehealth: Payer: Self-pay | Admitting: Internal Medicine

## 2021-03-25 DIAGNOSIS — Z954 Presence of other heart-valve replacement: Secondary | ICD-10-CM

## 2021-03-25 DIAGNOSIS — I10 Essential (primary) hypertension: Secondary | ICD-10-CM

## 2021-03-25 DIAGNOSIS — I509 Heart failure, unspecified: Secondary | ICD-10-CM | POA: Diagnosis not present

## 2021-03-25 DIAGNOSIS — I2510 Atherosclerotic heart disease of native coronary artery without angina pectoris: Secondary | ICD-10-CM

## 2021-03-25 NOTE — Progress Notes (Signed)
Virtual Visit via Telephone Note   This visit type was conducted due to national recommendations for restrictions regarding the COVID-19 Pandemic (e.g. social distancing) in an effort to limit this patient's exposure and mitigate transmission in our community.  Due to his co-morbid illnesses, this patient is at least at moderate risk for complications without adequate follow up.  This format is felt to be most appropriate for this patient at this time.  The patient did not have access to video technology/had technical difficulties with video requiring transitioning to audio format only (telephone).  All issues noted in this document were discussed and addressed.  No physical exam could be performed with this format.  Please refer to the patient's chart for his  consent to telehealth for [facility].    Date:  03/30/2021   ID:  [name] [name], DOB 1946/06/26, MRN [id number] The patient was identified using 2 identifiers.  Patient Location: Home Provider Location: Office/Clinic   PCP:  Nolene Ebbs, MD   [facility] Providers Cardiologist:  Dorris Carnes, MD {  Evaluation Performed:  Follow-Up Visit  Chief Complaint:  Follow up of aortic valve dz    History of Present Illness:    [name] [name] is a 75 y.o. male with AV dz (s/p AVR in 2007 ), CAD (s/p SVG to RCA)  dilated ascending aorta, COPD (FEV1 0.87),  hypertension, atherosclerosis   the patient also has a history of moderate LV dysfunction in the past.  The pt was recently seen by [name] for follow up of thoracic aneurysim   Echo was ordered   Results shown below  Talking to the pt he complains of SOB   Worse with heat/humidity    He denies CP   No dizzienss  No PND   No edema   Past Medical History:  Diagnosis Date   Aortic stenosis    Aortic stenosis    s/p bioprosthetic valve replacement   Arthritis    Ascending aortic aneurysm ([facility])    CAD (coronary artery disease) of bypass graft    s/p SVG to dRCA    Cardiomyopathy 2006   EF 30%; has history of CHF   CHF (congestive heart failure) ([facility])    COPD (chronic obstructive pulmonary disease) ([facility])    COPD (chronic obstructive pulmonary disease) ([facility])    FEV1/FVC -> 0.52 (69% of predicted)   Dyspnea    HX: COPD   GERD (gastroesophageal reflux disease)    Heart murmur    Hypertension    Lesion of buccal mucosa    right, with non-restoable abscessed teeth   Osteoarthritis    Tobacco abuse    Wears glasses    Past Surgical History:  Procedure Laterality Date   AORTIC VALVE REPLACEMENT     Bovine valve   AORTIC VALVE REPLACEMENT     bioprosthetic, 01/2006 - cannot find record of surgery (has scar)   COLONOSCOPY     COLONOSCOPY N/A 01/01/2017   Procedure: COLONOSCOPY;  Surgeon: Laurence Spates, MD;  Location: [facility];  Service: Endoscopy;  Laterality: N/A;   ESOPHAGOGASTRODUODENOSCOPY N/A 01/01/2017   Procedure: ESOPHAGOGASTRODUODENOSCOPY (EGD);  Surgeon: Laurence Spates, MD;  Location: [facility];  Service: Endoscopy;  Laterality: N/A;   MULTIPLE TOOTH EXTRACTIONS     TONSILLECTOMY     TOOTH EXTRACTION Bilateral 07/31/2020   Procedure: DENTAL RESTORATION/EXTRACTIONS;  Surgeon: Diona Browner, DDS;  Location: [facility];  Service: Oral Surgery;  Laterality: Bilateral;     Current Meds  Medication  Sig   acetaminophen (TYLENOL) 500 MG tablet Take 1,000 mg by mouth every 6 (six) hours as needed for moderate pain or headache.   atorvastatin (LIPITOR) 40 MG tablet Take 1 tablet (40 mg total) by mouth daily.   BETA CAROTENE PO Take 1 tablet by mouth once a week.    budesonide-formoterol (SYMBICORT) 160-4.5 MCG/ACT inhaler Inhale 2 puffs into the lungs 2 (two) times daily.   Carboxymethylcellul-Glycerin (LUBRICATING EYE DROPS OP) Place 1 drop into both eyes daily as needed (dry eyes).   celecoxib (CELEBREX) 100 MG capsule Take 100 mg by mouth daily.   dextromethorphan-guaiFENesin (MUCINEX DM) 30-600 MG per 12 hr tablet Take 1 tablet by mouth every  12 (twelve) hours as needed for cough (mucus build up).    furosemide (LASIX) 40 MG tablet Take 40 mg by mouth every evening.    Histamine Dihydrochloride (AUSTRALIAN DREAM ARTHRITIS) 0.025 % CREA Apply 1 application topically 4 (four) times daily as needed (pain.).   Ipratropium-Albuterol (COMBIVENT) 20-100 MCG/ACT AERS respimat Inhale 1 puff into the lungs every 6 (six) hours as needed for wheezing or shortness of breath.    ipratropium-albuterol (DUONEB) 0.5-2.5 (3) MG/3ML SOLN Take 3 mLs by nebulization 4 (four) times daily.   lisinopril (ZESTRIL) 5 MG tablet Take 5 mg by mouth daily.   Multiple Vitamin (MULTIVITAMIN WITH MINERALS) TABS tablet Take 1 tablet by mouth every other day. Alive   potassium chloride (K-DUR,KLOR-CON) 10 MEQ tablet Take 10 mEq by mouth every evening.   Simethicone 125 MG TABS Take 125 mg by mouth daily as needed (gas).   SYMBICORT 160-4.5 MCG/ACT inhaler INHALE 2 PUFFS INTO THE LUNGS TWICE DAILY   trolamine salicylate (ASPERCREME) 10 % cream Apply 1 application topically as needed for muscle pain.   Vitamin D, Ergocalciferol, (DRISDOL) 1.25 MG (50000 UT) CAPS capsule Take 5,000 Units by mouth every Monday.   [DISCONTINUED] albuterol (VENTOLIN HFA) 108 (90 Base) MCG/ACT inhaler INHALE 2 PUFFS INTO THE LUNGS EVERY 4 HOURS AS NEEDED FOR WHEEZING OR SHORTNESS OF BREATH   [DISCONTINUED] tiZANidine (ZANAFLEX) 4 MG tablet Take 4 mg by mouth 2 (two) times a day.      Allergies:   Atenolol   Social History   Tobacco Use   Smoking status: Former    Packs/day: 3.00    Years: 59.00    Pack years: 177.00    Types: Cigarettes    Start date: 71    Quit date: 2017    Years since quitting: 5.4   Smokeless tobacco: Never   Tobacco comments:    quit smoking cigarettes in 2015  Vaping Use   Vaping Use: Never used  Substance Use Topics   Alcohol use: No    Comment: states he quit many years ago   Drug use: No    Comment: previous h/o cocaine use, denies since 2006      Family Hx: The patient's family history includes Cancer in his brother and mother; Coronary artery disease in his father; Emphysema in his brother, father, mother, and sister; Heart failure in his father.  ROS:   Please see the history of present illness.     All other systems reviewed and are negative.   Prior CV studies:   The following studies were reviewed today:  EEcho   03/11/21  1. The aortic valve has been repaired/replaced. Aortic valve regurgitation is not visualized. EOA 1.23 cm2, iEOA 0.69 cm2/m2, AT 105 msec, DVI 0.25. Findings consistent with prosthetic valve obstruction. There  is a 25 mm bovine pericardial valve present in the aortic position. Procedure Date: 01/18/2006 (Dr. Roxan Hockey). Aortic valve mean gradient measures 23.0 mmHg. Aortic valve Vmax measures 3.14 m/s. 2. Aortic dilatation noted. There is severe aneurysmal dilatation of the aortic root, measuring 50 mm. There is moderate aneurysmal dilatation of the ascending aorta, measuring 48 mm. These measurements are approximate given suboptimal image quality. Recommend gated CTA aorta for best assessment of aortic root, given relative change since last echo and CTA. 3. Left ventricular ejection fraction, by estimation, is 50%. The left ventricle has low normal function. The left ventricle has no regional wall motion abnormalities. There is mild left ventricular hypertrophy. Left ventricular diastolic parameters are consistent with Grade I diastolic dysfunction (impaired relaxation). 4. Right ventricular systolic function is mildly reduced. The right ventricular size is mildly enlarged. Tricuspid regurgitation signal is inadequate for assessing PA pressure. 5. Right atrial size was mildly dilated. 6. The mitral valve is normal in structure. Trivial mitral valve regurgitation. No evidence of mitral stenosis. 7. The inferior vena cava is dilated in size with >50% respiratory variability, suggesting right atrial  pressure of 8 mmHg.  Labs/Other Tests and Data Reviewed:    EKG:  No ECG reviewed.  Recent Labs: 11/30/2020: ALT 12; BUN 13; Creat 1.06; Hemoglobin 9.6; Platelets 238; Potassium 4.6; Sodium 138; TSH 1.31   Recent Lipid Panel Lab Results  Component Value Date/Time   CHOL 161 11/30/2020 09:20 AM   CHOL 143 08/18/2017 10:57 AM   TRIG 69 11/30/2020 09:20 AM   HDL 73 11/30/2020 09:20 AM   HDL 69 08/18/2017 10:57 AM   CHOLHDL 2.2 11/30/2020 09:20 AM   LDLCALC 73 11/30/2020 09:20 AM    Wt Readings from Last 3 Encounters:  03/25/21 158 lb (71.7 kg)  01/05/21 163 lb (73.9 kg)  07/30/20 149 lb (67.6 kg)         Objective:    Vital Signs:  BP (!) 149/75   Pulse 72   Ht 5\' 6"  (1.676 m)   Wt 158 lb (71.7 kg)   BMI 25.50 kg/m   BP 149/75   P 72   Wt 158    Exam:  Not done    ASSESSMENT & PLAN:      Aortic valve dz  Pt is  , s/p AVR 25 mm bovine pericardial valve n 2007    REcent echo earlier this month mean gradient was measured at 23 mm Hg   Dimensionless index was 0.25  consistent with severe AS   Mean gradient was 17 in 2020 and 24 in 2018   Dimensionless index has been 0.25 to 0.27    Leaflets difficult to see on current echo   He has signficant SOB but also has severe COPD     Will review with structural team   Difficult given COPD, TAA and hx of previous surgery   2  CAD   s/p CABG in 2007   I am not convinced of active angina  Follow  3  HTN  BP is labile   In April was 120s      4  CHF   PT's LVEF has been down in past  Improved at approximately 50%  5  COPD  Severe FEV1 0.87   Pt with signif SOB     5  Dilated aorta  CT shows ascending aorta measures 45 mm   Echo in June was different with (but echo not optimal )  On echo root  measured 50 mm Asc measured 48 mm      Followed by [name]    7  HL   Last lipids LDL 73  HDL 73  (Feb 2022)         Time:   Today, I have spent 15 minutes with the patient with telehealth technology discussing the above  problems.     Medication Adjustments/Labs and Tests Ordered: Current medicines are reviewed at length with the patient today.  Concerns regarding medicines are outlined above.   Tests Ordered: No orders of the defined types were placed in this encounter.   Medication Changes: No orders of the defined types were placed in this encounter.   Follow Up:    Signed, Dorris Carnes, MD  03/30/2021 10:33 PM    [facility]

## 2021-03-25 NOTE — Telephone Encounter (Signed)
  Patient Consent for Virtual Visit        DONTAI [name] has provided verbal consent on 03/25/2021 for a virtual visit (video or telephone).   CONSENT FOR VIRTUAL VISIT FOR:  [name] [name]  By participating in this virtual visit I agree to the following:  I hereby voluntarily request, consent and authorize [facility] and its employed or contracted physicians, physician assistants, nurse practitioners or other licensed health care professionals (the Practitioner), to provide me with telemedicine health care services (the "Services") as deemed necessary by the treating Practitioner. I acknowledge and consent to receive the Services by the Practitioner via telemedicine. I understand that the telemedicine visit will involve communicating with the Practitioner through live audiovisual communication technology and the disclosure of certain medical information by electronic transmission. I acknowledge that I have been given the opportunity to request an in-person assessment or other available alternative prior to the telemedicine visit and am voluntarily participating in the telemedicine visit.  I understand that I have the right to withhold or withdraw my consent to the use of telemedicine in the course of my care at any time, without affecting my right to future care or treatment, and that the Practitioner or I may terminate the telemedicine visit at any time. I understand that I have the right to inspect all information obtained and/or recorded in the course of the telemedicine visit and may receive copies of available information for a reasonable fee.  I understand that some of the potential risks of receiving the Services via telemedicine include:  Delay or interruption in medical evaluation due to technological equipment failure or disruption; Information transmitted may not be sufficient (e.g. poor resolution of images) to allow for appropriate medical decision making by the Practitioner;  and/or  In rare instances, security protocols could fail, causing a breach of personal health information.  Furthermore, I acknowledge that it is my responsibility to provide information about my medical history, conditions and care that is complete and accurate to the best of my ability. I acknowledge that Practitioner's advice, recommendations, and/or decision may be based on factors not within their control, such as incomplete or inaccurate data provided by me or distortions of diagnostic images or specimens that may result from electronic transmissions. I understand that the practice of medicine is not an exact science and that Practitioner makes no warranties or guarantees regarding treatment outcomes. I acknowledge that a copy of this consent can be made available to me via my patient portal ([facility]), or I can request a printed copy by calling the office of [facility].    I understand that my insurance will be billed for this visit.   I have read or had this consent read to me. I understand the contents of this consent, which adequately explains the benefits and risks of the Services being provided via telemedicine.  I have been provided ample opportunity to ask questions regarding this consent and the Services and have had my questions answered to my satisfaction. I give my informed consent for the services to be provided through the use of telemedicine in my medical care

## 2021-03-29 ENCOUNTER — Other Ambulatory Visit: Payer: Self-pay | Admitting: Internal Medicine

## 2021-03-29 DIAGNOSIS — J432 Centrilobular emphysema: Secondary | ICD-10-CM

## 2021-05-11 ENCOUNTER — Other Ambulatory Visit: Payer: Self-pay | Admitting: Internal Medicine

## 2021-05-11 DIAGNOSIS — J432 Centrilobular emphysema: Secondary | ICD-10-CM

## 2021-05-26 ENCOUNTER — Other Ambulatory Visit: Payer: Self-pay | Admitting: Internal Medicine

## 2021-05-26 DIAGNOSIS — J432 Centrilobular emphysema: Secondary | ICD-10-CM

## 2021-05-27 ENCOUNTER — Other Ambulatory Visit: Payer: Self-pay | Admitting: Internal Medicine

## 2021-05-27 DIAGNOSIS — J432 Centrilobular emphysema: Secondary | ICD-10-CM

## 2021-06-11 ENCOUNTER — Other Ambulatory Visit: Payer: Self-pay | Admitting: Thoracic Surgery (Cardiothoracic Vascular Surgery)

## 2021-06-11 ENCOUNTER — Other Ambulatory Visit: Payer: Self-pay | Admitting: Internal Medicine

## 2021-06-11 DIAGNOSIS — I712 Thoracic aortic aneurysm, without rupture, unspecified: Secondary | ICD-10-CM

## 2021-06-11 DIAGNOSIS — J432 Centrilobular emphysema: Secondary | ICD-10-CM

## 2021-06-15 ENCOUNTER — Telehealth: Payer: Self-pay | Admitting: Internal Medicine

## 2021-06-15 DIAGNOSIS — J432 Centrilobular emphysema: Secondary | ICD-10-CM

## 2021-06-15 MED ORDER — ALBUTEROL SULFATE HFA 108 (90 BASE) MCG/ACT IN AERS: 2.0000 | INHALATION_SPRAY | RESPIRATORY_TRACT | 0 refills | Status: DC | PRN

## 2021-06-15 NOTE — Telephone Encounter (Signed)
Rx for pt's albuterol inhaler has been sent to preferred pharmacy for pt as pt does have an upcoming appt scheduled with Dr. Shearon Stalls. Attempted to call pt to let him know this had been done but unable to reach. Left detailed message on pt's machine letting him know this had been done. Nothing further needed.

## 2021-06-17 ENCOUNTER — Other Ambulatory Visit: Payer: Self-pay | Admitting: Internal Medicine

## 2021-07-01 ENCOUNTER — Other Ambulatory Visit: Payer: Self-pay | Admitting: Internal Medicine

## 2021-07-01 ENCOUNTER — Telehealth: Payer: Self-pay | Admitting: Internal Medicine

## 2021-07-01 DIAGNOSIS — J432 Centrilobular emphysema: Secondary | ICD-10-CM

## 2021-07-01 MED ORDER — ALBUTEROL SULFATE HFA 108 (90 BASE) MCG/ACT IN AERS: 2.0000 | INHALATION_SPRAY | RESPIRATORY_TRACT | 1 refills | Status: DC | PRN

## 2021-07-01 NOTE — Telephone Encounter (Signed)
Called and spoke with Patient.  Patient requested albuterol refill to [facility].  Patient has OV scheduled with Dr. Shearon Stalls for 07/13/21.  Requested prescription sent to requested pharmacy.  Nothing further at this time.

## 2021-07-08 ENCOUNTER — Telehealth: Payer: Self-pay | Admitting: Internal Medicine

## 2021-07-08 NOTE — Telephone Encounter (Signed)
ND pt stated that he cannot tolerate wearing a mask during his appt and is wanting to know how we can accommodate this?  Do you want to do a telephone visit?  Pts visit on 10/11 and this is when ND will be back in the office.   Please advise. Thanks

## 2021-07-13 ENCOUNTER — Other Ambulatory Visit: Payer: Self-pay

## 2021-07-13 ENCOUNTER — Encounter: Payer: Self-pay | Admitting: Internal Medicine

## 2021-07-13 ENCOUNTER — Telehealth (INDEPENDENT_AMBULATORY_CARE_PROVIDER_SITE_OTHER): Payer: Medicare Other | Admitting: Internal Medicine

## 2021-07-13 DIAGNOSIS — J432 Centrilobular emphysema: Secondary | ICD-10-CM

## 2021-07-13 MED ORDER — ALBUTEROL SULFATE HFA 108 (90 BASE) MCG/ACT IN AERS: 2.0000 | INHALATION_SPRAY | RESPIRATORY_TRACT | 5 refills | Status: DC | PRN

## 2021-07-13 MED ORDER — IPRATROPIUM-ALBUTEROL 0.5-2.5 (3) MG/3ML IN SOLN: 3.0000 mL | RESPIRATORY_TRACT | 5 refills | Status: DC | PRN

## 2021-07-13 MED ORDER — SYMBICORT 160-4.5 MCG/ACT IN AERO: 2.0000 | INHALATION_SPRAY | Freq: Two times a day (BID) | RESPIRATORY_TRACT | 5 refills | Status: DC

## 2021-07-13 NOTE — Progress Notes (Signed)
[name] [name]    [id number]    [birth year]  Primary Care Physician:Avbuere, Christean Grief, MD Date of Appointment: 07/13/2021 Established Patient Visit  I connected with [name] [name]  on 07/13/2021 by telephone and verified that I am speaking with the correct person using two identifiers. Patient is at home, Physician is in office.    I discussed the limitations of evaluation and management by telemedicine. The patient expressed understanding and agreed to proceed.   Chief complaint:   Chief Complaint  Patient presents with   Follow-up    Still not breathing well    HPI: History of COPD.  He had pulmonary function testing when he saw Dr. Melvyn Novas in 2013.    History includes: AV dz (s/p AVR in 2007 ), CAD (s/p SVG to RCA)  dilated ascending aorta, COPD, hypertension, atherosclerosis, moderate LV dysfunction in the past   Interval Updates: Patient had telephone visit today because he does not want to wear a mask.  Still having trouble breathing.  Using albuterol more than 7-8 times/day. Taking duoneb 4 times/day. He is taking duonebs with combivent.  Symbicort BID Has not tolerated Spiriva in the past.  No hospitalizations or ED visits, no exacerbations of breathing.   I have reviewed the patient's family social and past medical history and updated as appropriate.   Past Medical History:  Diagnosis Date   Aortic stenosis    Aortic stenosis    s/p bioprosthetic valve replacement   Arthritis    Ascending aortic aneurysm (HCC)    CAD (coronary artery disease) of bypass graft    s/p SVG to dRCA   Cardiomyopathy 2006   EF 30%; has history of CHF   CHF (congestive heart failure) (HCC)    COPD (chronic obstructive pulmonary disease) (HCC)    COPD (chronic obstructive pulmonary disease) (HCC)    FEV1/FVC -> 0.52 (69% of predicted)   Dyspnea    HX: COPD   GERD (gastroesophageal reflux disease)    Heart murmur    Hypertension    Lesion of buccal mucosa    right,  with non-restoable abscessed teeth   Osteoarthritis    Tobacco abuse    Wears glasses     Past Surgical History:  Procedure Laterality Date   AORTIC VALVE REPLACEMENT     Bovine valve   AORTIC VALVE REPLACEMENT     bioprosthetic, 01/2006 - cannot find record of surgery (has scar)   COLONOSCOPY     COLONOSCOPY N/A 01/01/2017   Procedure: COLONOSCOPY;  Surgeon: Laurence Spates, MD;  Location: [place];  Service: Endoscopy;  Laterality: N/A;   ESOPHAGOGASTRODUODENOSCOPY N/A 01/01/2017   Procedure: ESOPHAGOGASTRODUODENOSCOPY (EGD);  Surgeon: Laurence Spates, MD;  Location: [facility] ENDOSCOPY;  Service: Endoscopy;  Laterality: N/A;   MULTIPLE TOOTH EXTRACTIONS     TONSILLECTOMY     TOOTH EXTRACTION Bilateral 07/31/2020   Procedure: DENTAL RESTORATION/EXTRACTIONS;  Surgeon: Diona Browner, DDS;  Location: [place];  Service: Oral Surgery;  Laterality: Bilateral;    Family History  Problem Relation Age of Onset   Cancer Mother        uterine cancer   Emphysema Mother    Heart failure Father    Coronary artery disease Father    Emphysema Father    Emphysema Sister    Cancer Brother        neck/throat cancer   Emphysema Brother     Social History   Occupational History   Occupation: Retired  Comment: Mechanic  Tobacco Use   Smoking status: Former    Packs/day: 3.00    Years: 59.00    Pack years: 177.00    Types: Cigarettes    Start date: 35    Quit date: 2017    Years since quitting: 5.7   Smokeless tobacco: Never   Tobacco comments:    quit smoking cigarettes in 2015  Vaping Use   Vaping Use: Never used  Substance and Sexual Activity   Alcohol use: No    Comment: states he quit many years ago   Drug use: No    Comment: previous h/o cocaine use, 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  No audible wheezing.   Data Reviewed: Imaging: I have personally reviewed the CT angio from August 2020.   Notably there is severe centrilobular emphysema throughout both lung fields upper and lower distribution has homogenous.   PFTs:  Pulmonary function testing sept 2021 demonstrates very severe airflow limitation with an FEV1 of 34% and positive BD response. severely reduced DLCO and lung volumes show hyperinflation consistent with emphysema.  Immunization status: Immunization History  Administered Date(s) Administered   Fluad Quad(high Dose 65+) 05/29/2019, 06/18/2020   Moderna Sars-Covid-2 Vaccination 11/28/2019, 12/26/2019   Pneumococcal Conjugate-13 12/05/2018   Zoster Recombinat (Shingrix) 02/09/2018, 12/05/2018    Assessment:  Severe centrilobular emphysema FEV1 less 35% 155-pack-year smoking history, quit 2013 Status post aortic valve replacement Coronary artery disease status post CABG  Plan/Recommendations: Severe copd, not iproving.  Continue albuterol and symbicort. Instructed him to only take duoneb, stop combivent. Will have him back in the office in three months.   I spent 11 minutes in the care of this patient today including pre-charting, chart review, review of results, telephone conversation, coordination of care and communication with consultants etc.).    Return to Care: Return in about 3 months (around 10/13/2021).  Lenice Llamas, MD Pulmonary and [place]

## 2021-07-13 NOTE — Telephone Encounter (Signed)
Called and spoke with patient. I offered to send a link to his phone for a video visit but he stated that he is not tech savvy and would prefer a phone call. Verified the best number to reach him is [phone]. He verbalized understanding of the process of a phone visit.   Appt has been changed.   Nothing further needed at time of call.

## 2021-07-14 ENCOUNTER — Other Ambulatory Visit: Payer: Self-pay | Admitting: Internal Medicine

## 2021-07-15 ENCOUNTER — Ambulatory Visit
Admission: RE | Admit: 2021-07-15 | Discharge: 2021-07-15 | Disposition: A | Discharge status: Home / Self Care | Payer: Medicare Other | Source: Ambulatory Visit | Attending: Thoracic Surgery (Cardiothoracic Vascular Surgery) | Admitting: Thoracic Surgery (Cardiothoracic Vascular Surgery)

## 2021-07-15 DIAGNOSIS — I712 Thoracic aortic aneurysm, without rupture, unspecified: Secondary | ICD-10-CM

## 2021-07-15 MED ORDER — IOPAMIDOL (ISOVUE-370) INJECTION 76%
75.0000 mL | Freq: Once | INTRAVENOUS | Status: AC | PRN
  Administered 2021-07-15: 75 mL via INTRAVENOUS

## 2021-07-19 ENCOUNTER — Ambulatory Visit (INDEPENDENT_AMBULATORY_CARE_PROVIDER_SITE_OTHER): Payer: Medicare Other | Admitting: Physician Assistant

## 2021-07-19 ENCOUNTER — Other Ambulatory Visit: Payer: Self-pay

## 2021-07-19 ENCOUNTER — Encounter: Payer: Self-pay | Admitting: Physician Assistant

## 2021-07-19 VITALS — BP 163/78 | HR 73 | Resp 20 | Ht 66.0 in | Wt 163.0 lb

## 2021-07-19 DIAGNOSIS — I712 Thoracic aortic aneurysm, without rupture, unspecified: Secondary | ICD-10-CM
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
## 2021-07-20 ENCOUNTER — Ambulatory Visit: Payer: Medicare Other

## 2021-10-08 ENCOUNTER — Other Ambulatory Visit: Payer: Self-pay | Admitting: Internal Medicine

## 2021-10-08 DIAGNOSIS — J432 Centrilobular emphysema: Secondary | ICD-10-CM

## 2021-12-01 ENCOUNTER — Telehealth: Payer: Self-pay | Admitting: Internal Medicine

## 2021-12-01 DIAGNOSIS — J432 Centrilobular emphysema: Secondary | ICD-10-CM

## 2021-12-01 MED ORDER — ALBUTEROL SULFATE HFA 108 (90 BASE) MCG/ACT IN AERS: 2.0000 | INHALATION_SPRAY | RESPIRATORY_TRACT | 2 refills | Status: DC | PRN

## 2021-12-01 NOTE — Telephone Encounter (Signed)
Called patient and informed him that I sent in his refill to his preferred pharmacy. Nothing further  ?

## 2021-12-06 ENCOUNTER — Other Ambulatory Visit: Payer: Self-pay | Admitting: Thoracic Surgery (Cardiothoracic Vascular Surgery)

## 2021-12-06 DIAGNOSIS — I7121 Aneurysm of the ascending aorta, without rupture: Secondary | ICD-10-CM

## 2021-12-29 ENCOUNTER — Telehealth: Payer: Self-pay | Admitting: Internal Medicine

## 2021-12-29 MED ORDER — BUDESONIDE-FORMOTEROL FUMARATE 160-4.5 MCG/ACT IN AERO: 2.0000 | INHALATION_SPRAY | Freq: Two times a day (BID) | RESPIRATORY_TRACT | 5 refills | Status: DC

## 2021-12-29 NOTE — Telephone Encounter (Signed)
Symbicort sent to preferred pharmacy. ?Patient is aware and voiced his understanding.  ?Nothing further needed.  ?

## 2022-01-12 ENCOUNTER — Ambulatory Visit
Admission: RE | Admit: 2022-01-12 | Discharge: 2022-01-12 | Disposition: A | Discharge status: Home / Self Care | Payer: Medicare Other | Source: Ambulatory Visit | Attending: Thoracic Surgery (Cardiothoracic Vascular Surgery) | Admitting: Thoracic Surgery (Cardiothoracic Vascular Surgery)

## 2022-01-12 ENCOUNTER — Telehealth: Payer: Self-pay | Admitting: Internal Medicine

## 2022-01-12 DIAGNOSIS — I7121 Aneurysm of the ascending aorta, without rupture: Secondary | ICD-10-CM

## 2022-01-12 DIAGNOSIS — J432 Centrilobular emphysema: Secondary | ICD-10-CM

## 2022-01-12 MED ORDER — IOPAMIDOL (ISOVUE-370) INJECTION 76%
75.0000 mL | Freq: Once | INTRAVENOUS | Status: AC | PRN
  Administered 2022-01-12: 75 mL via INTRAVENOUS

## 2022-01-12 MED ORDER — ALBUTEROL SULFATE HFA 108 (90 BASE) MCG/ACT IN AERS: 2.0000 | INHALATION_SPRAY | RESPIRATORY_TRACT | 5 refills | Status: DC | PRN

## 2022-01-12 NOTE — Telephone Encounter (Signed)
Rx for pt's rescue inhaler has been sent to preferred pharmacy for pt. Called and spoke with pt letting him know this was done and he verbalized understanding. Nothing further needed. ?

## 2022-01-18 ENCOUNTER — Ambulatory Visit (INDEPENDENT_AMBULATORY_CARE_PROVIDER_SITE_OTHER): Payer: Medicare Other | Admitting: Thoracic Surgery (Cardiothoracic Vascular Surgery)

## 2022-01-18 ENCOUNTER — Encounter: Payer: Self-pay | Admitting: Thoracic Surgery (Cardiothoracic Vascular Surgery)

## 2022-01-18 ENCOUNTER — Other Ambulatory Visit: Payer: Medicaid Other

## 2022-01-18 VITALS — BP 145/89 | HR 91 | Resp 20 | Ht 66.0 in | Wt 145.7 lb

## 2022-01-18 DIAGNOSIS — I712 Thoracic aortic aneurysm, without rupture, unspecified: Secondary | ICD-10-CM | POA: Diagnosis not present

## 2022-01-18 NOTE — Progress Notes (Signed)
? ?   ?[address] ?      [place] [address] ?            [phone]   ? ? ?HPI: [name] [name] returns for a scheduled follow-up visit regarding his ascending aneurysm ? ?[name] [name] is a 76 year old man with a past history of heavy tobacco abuse, COPD, bicuspid aortic valve, aortic stenosis, aortic valve replacement in 2007, valvular cardiomyopathy, congestive heart failure, hypertension, and aortic root aneurysm. ? ?I did an aortic valve replacement with a bioprosthetic valve in 2007.  An echo in 2016 showed an aortic root aneurysm.  He has been followed since that time.  The aneurysm has remained relatively stable over time.  In June 2022 he had an echocardiogram which showed some progression of prosthetic aortic valve stenosis. ? ?He continues to have shortness of breath with exertion.  He is lost some weight and feels like that is helped to some degree.  He says his breathing is better than it was a year ago.  It does vary from day-to-day.  He quit smoking in 2017. ? ?Past Medical History:  ?Diagnosis Date  ? Aortic stenosis   ? Aortic stenosis   ? s/p bioprosthetic valve replacement  ? Arthritis   ? Ascending aortic aneurysm ([place])   ? CAD (coronary artery disease) of bypass graft   ? s/p SVG to dRCA  ? Cardiomyopathy 2006  ? EF 30%; has history of CHF  ? CHF (congestive heart failure) ([place])   ? COPD (chronic obstructive pulmonary disease) ([place])   ? COPD (chronic obstructive pulmonary disease) ([place])   ? FEV1/FVC -> 0.52 (69% of predicted)  ? Dyspnea   ? HX: COPD  ? GERD (gastroesophageal reflux disease)   ? Heart murmur   ? Hypertension   ? Lesion of buccal mucosa   ? right, with non-restoable abscessed teeth  ? Osteoarthritis   ? Tobacco abuse   ? Wears glasses   ? ? ?Current Outpatient Medications  ?Medication Sig Dispense Refill  ? acetaminophen (TYLENOL) 500 MG tablet Take 1,000 mg by mouth every 6 (six) hours as needed for moderate pain or headache.    ? albuterol (VENTOLIN HFA) 108 (90  Base) MCG/ACT inhaler Inhale 2 puffs into the lungs every 4 (four) hours as needed for wheezing or shortness of breath. 8.5 g 5  ? atorvastatin (LIPITOR) 40 MG tablet Take 1 tablet (40 mg total) by mouth daily. 90 tablet 0  ? BETA CAROTENE PO Take 1 tablet by mouth once a week.     ? budesonide-formoterol (SYMBICORT) 160-4.5 MCG/ACT inhaler Inhale 2 puffs into the lungs 2 (two) times daily. 1 each 5  ? Carboxymethylcellul-Glycerin (LUBRICATING EYE DROPS OP) Place 1 drop into both eyes daily as needed (dry eyes).    ? celecoxib (CELEBREX) 100 MG capsule Take 100 mg by mouth daily.    ? dextromethorphan-guaiFENesin (MUCINEX DM) 30-600 MG per 12 hr tablet Take 1 tablet by mouth every 12 (twelve) hours as needed for cough (mucus build up).     ? furosemide (LASIX) 40 MG tablet Take 40 mg by mouth every evening.     ? Ipratropium-Albuterol (COMBIVENT) 20-100 MCG/ACT AERS respimat Inhale 1 puff into the lungs every 6 (six) hours as needed for wheezing or shortness of breath.     ? ipratropium-albuterol (DUONEB) 0.5-2.5 (3) MG/3ML SOLN USE 1 VIAL VIA NEBULIZER EVERY 6 HOURS AS NEEDED FOR WHEEZING OR SHORTNESS OF BREATH 360 mL 3  ?  ipratropium-albuterol (DUONEB) 0.5-2.5 (3) MG/3ML SOLN Take 3 mLs by nebulization every 4 (four) hours as needed. 360 mL 5  ? lisinopril (PRINIVIL,ZESTRIL) 5 MG tablet Take 5 mg by mouth daily.    ? lisinopril (ZESTRIL) 5 MG tablet Take 5 mg by mouth daily.    ? Multiple Vitamin (MULTIVITAMIN WITH MINERALS) TABS tablet Take 1 tablet by mouth every other day. Alive    ? potassium chloride (K-DUR,KLOR-CON) 10 MEQ tablet Take 10 mEq by mouth every evening.    ? Simethicone 125 MG TABS Take 125 mg by mouth daily as needed (gas).    ? SYMBICORT 160-4.5 MCG/ACT inhaler Inhale 2 puffs into the lungs 2 (two) times daily. 10.2 g 5  ? trolamine salicylate (ASPERCREME) 10 % cream Apply 1 application topically as needed for muscle pain.    ? Vitamin D, Ergocalciferol, (DRISDOL) 1.25 MG (50000 UT) CAPS  capsule Take 5,000 Units by mouth every Monday.    ? Vitamin D, Ergocalciferol, (DRISDOL) 1.25 MG (50000 UT) CAPS capsule Take 50,000 Units by mouth every Monday.    ? ?No current facility-administered medications for this visit.  ? ? ?Physical Exam ?BP (!) 145/89 (BP Location: Left Arm, Patient Position: Sitting, Cuff Size: Normal)   Pulse 91   Resp 20   Ht '5\' 6"'$  (1.676 m)   Wt 145 lb 11.2 oz (66.1 kg)   SpO2 92% Comment: RA  BMI 23.86 kg/m?  ?76 year old man in no acute distress ?Alert and oriented x3 with no focal deficits ?Faint carotid bruits versus transmitted murmur ?Cardiac regular rate and rhythm with 2/6 to 3/6 systolic murmur ?Lungs diminished breath sounds bilaterally, no rales or wheezing ?No peripheral edema ? ?Diagnostic Tests: ?CT ANGIOGRAPHY CHEST WITH CONTRAST ?  ?TECHNIQUE: ?Multidetector CT imaging of the chest was performed using the ?standard protocol during bolus administration of intravenous ?contrast. Multiplanar CT image reconstructions and MIPs were ?obtained to evaluate the vascular anatomy. ?  ?RADIATION DOSE REDUCTION: This exam was performed according to the ?departmental dose-optimization program which includes automated ?exposure control, adjustment of the mA and/or kV according to ?patient size and/or use of iterative reconstruction technique. ?  ?CONTRAST:  70m ISOVUE-370 IOPAMIDOL (ISOVUE-370) INJECTION 76% ?  ?COMPARISON:  October 2022 ?  ?FINDINGS: ?Cardiovascular: Preferential opacification of the thoracic aorta. ?Stable appearance of ascending thoracic aortic aneurysm measuring ?4.5 cm. The pulmonary arteries are normal in caliber without central ?filling defect. Stable postsurgical changes status post aortic valve ?replacement and coronary artery bypass grafting. No pericardial ?effusion. ?  ?Mediastinum/Nodes: No enlarged mediastinal, hilar, or axillary lymph ?nodes. The thyroid gland appears normal. ?  ?Lungs/Pleura: No pleural effusion. No pneumothorax. No  focal ?consolidation. Centrilobular emphysema more pronounced in the upper ?lungs. Bronchial wall thickening more pronounced in the lower lungs. ?There is a new right middle lobe solid pulmonary nodule measuring 6 ?mm. ?  ?Musculoskeletal: No aggressive osseous lesions. Status post median ?sternotomy. ?  ?Upper abdomen: The visualized upper abdomen is unremarkable. ?  ?Review of the MIP images confirms the above findings. ?  ?IMPRESSION: ?1. Stable appearance of ascending thoracic aortic aneurysm measuring ?4.5 cm. Ascending thoracic aortic aneurysm. Recommend semi-annual ?imaging followup by CTA or MRA and referral to cardiothoracic ?surgery if not already obtained. This recommendation follows 2010 ?ACCF/AHA/AATS/ACR/ASA/SCA/SCAI/SIR/STS/SVM Guidelines for the ?Diagnosis and Management of Patients With Thoracic Aortic Disease. ?Circulation. 2010; 121:: P619-J093 Aortic aneurysm NOS (ICD10-I71.9) ?2. Stable postsurgical changes of CABG and aortic valve replacement. ?3. There is a new 6 mm solid pulmonary  nodule in the right middle ?lobe. Recommend a non-contrast Chest CT at 6-12 months, then another ?non-contrast Chest CT at 18-24 months. ?4. Emphysematous and bronchitic changes of the lungs compatible with ?underlying COPD. ?  ?  ?Electronically Signed ?  By: Albin Felling M.D. ?  On: 01/12/2022 12:17 ?I personally reviewed the CT images.  There is a 4.6 cm ascending aneurysm.  Measures larger at the sinuses of Valsalva as would be expected.  Also a new 6 mm nodule in the right middle lobe. ? ?Impression: ?[name] [name] is a 76 year old man with a past history of heavy tobacco abuse, COPD, bicuspid aortic valve, aortic stenosis, aortic valve replacement in 2007, valvular cardiomyopathy, congestive heart failure, hypertension, and aortic root aneurysm. ? ?He does have some chronic dyspnea mostly due to COPD which is severe.  Overall symptoms are stable and he is doing reasonably well. ? ?Ascending aneurysm-stable.   Needs continued semiannual follow-up. ? ?Prosthetic aortic valve stenosis-needs follow-up with Dr. Harrington Challenger.  He is not aware of any schedule appointment. ? ?Right middle lobe lung nodule-6 mm.  Will follow that up when we d

## 2022-01-24 ENCOUNTER — Telehealth: Payer: Self-pay

## 2022-01-24 DIAGNOSIS — Z952 Presence of prosthetic heart valve: Secondary | ICD-10-CM

## 2022-01-24 NOTE — Telephone Encounter (Signed)
-----   Message from Fay Records, MD sent at 01/20/2022  7:30 AM EDT ----- ?PLease see about getting a repeat echo and setting patient up to see me ?He has AV prosthesis and severe COPD ?----- Message ----- ?From: Melrose Nakayama, MD ?Sent: 01/18/2022  11:54 AM EDT ?To: Fay Records, MD ? ?Nevin Bloodgood, ? ?I saw [name] [name] back in the office today.  He is a 76 year old man who had an AVR in 2007.  He has had some prosthetic aortic valve stenosis.  His ascending aneurysm is stable.  He is coming up on a year from his last echo and is not sure if he has an appointment with you in the near future. ? ?Thanks ? ?Richardson Landry ? ?

## 2022-01-24 NOTE — Telephone Encounter (Addendum)
Pt advised and I made him an appt for 02/08/22... I place an order for the Echo. He will have prior to his appt the same day.  ?

## 2022-02-08 ENCOUNTER — Ambulatory Visit (HOSPITAL_COMMUNITY): Payer: Medicare Other | Attending: Cardiology

## 2022-02-08 ENCOUNTER — Ambulatory Visit (INDEPENDENT_AMBULATORY_CARE_PROVIDER_SITE_OTHER): Payer: Medicare Other | Admitting: Internal Medicine

## 2022-02-08 ENCOUNTER — Encounter: Payer: Self-pay | Admitting: Internal Medicine

## 2022-02-08 VITALS — BP 144/78 | HR 76 | Ht 66.0 in

## 2022-02-08 DIAGNOSIS — Z79899 Other long term (current) drug therapy: Secondary | ICD-10-CM

## 2022-02-08 DIAGNOSIS — Z952 Presence of prosthetic heart valve: Secondary | ICD-10-CM | POA: Diagnosis present

## 2022-02-08 LAB — ECHOCARDIOGRAM COMPLETE
AR max vel: 1.03 cm2
AV Area VTI: 1.04 cm2
AV Area mean vel: 1.01 cm2
AV Mean grad: 19.3 mmHg
AV Peak grad: 33 mmHg
Ao pk vel: 2.87 m/s
Area-P 1/2: 3.11 cm2
S' Lateral: 5.3 cm

## 2022-02-08 NOTE — Patient Instructions (Signed)
Medication Instructions:  ? ?*If you need a refill on your cardiac medications before your next appointment, please call your pharmacy* ? ? ?Lab Work: ?Cmet, cbc, pro bnp ? ?If you have labs (blood work) drawn today and your tests are completely normal, you will receive your results only by: ?MyChart Message (if you have MyChart) OR ?A paper copy in the mail ?If you have any lab test that is abnormal or we need to change your treatment, we will call you to review the results. ? ? ?Testing/Procedures: ? ? ?Follow-Up: ?At [facility], you and your health needs are our priority.  As part of our continuing mission to provide you with exceptional heart care, we have created designated Provider Care Teams.  These Care Teams include your primary Cardiologist (physician) and Advanced Practice Providers (APPs -  Physician Assistants and Nurse Practitioners) who all work together to provide you with the care you need, when you need it. ? ?We recommend signing up for the patient portal called "MyChart".  Sign up information is provided on this After Visit Summary.  MyChart is used to connect with patients for Virtual Visits (Telemedicine).  Patients are able to view lab/test results, encounter notes, upcoming appointments, etc.  Non-urgent messages can be sent to your provider as well.   ?To learn more about what you can do with MyChart, go to [email or web address].   ? ? ?Important Information About Sugar ? ? ? ? ?  ?

## 2022-02-08 NOTE — Progress Notes (Deleted)
Patient ID: [name] [name] MRN: [id number] DOB/AGE: October 30, 1945 76 y.o.  Primary Care Physician:Avbuere, Edwin, MD Primary Cardiologist: ***   FOCUSED CARDIOVASCULAR PROBLEM LIST:  ***    HISTORY OF PRESENT ILLNESS: The patient is a 76 y.o. male with the indicated medical history here for  ***  Past Medical History:  Diagnosis Date   Aortic stenosis    Aortic stenosis    s/p bioprosthetic valve replacement   Arthritis    Ascending aortic aneurysm ([facility])    CAD (coronary artery disease) of bypass graft    s/p SVG to dRCA   Cardiomyopathy 2006   EF 30%; has history of CHF   CHF (congestive heart failure) ([facility])    COPD (chronic obstructive pulmonary disease) ([facility])    COPD (chronic obstructive pulmonary disease) ([facility])    FEV1/FVC -> 0.52 (69% of predicted)   Dyspnea    HX: COPD   GERD (gastroesophageal reflux disease)    Heart murmur    Hypertension    Lesion of buccal mucosa    right, with non-restoable abscessed teeth   Osteoarthritis    Tobacco abuse    Wears glasses     Past Surgical History:  Procedure Laterality Date   AORTIC VALVE REPLACEMENT     Bovine valve   AORTIC VALVE REPLACEMENT     bioprosthetic, 01/2006 - cannot find record of surgery (has scar)   COLONOSCOPY     COLONOSCOPY N/A 01/01/2017   Procedure: COLONOSCOPY;  Surgeon: Laurence Spates, MD;  Location: [place];  Service: Endoscopy;  Laterality: N/A;   ESOPHAGOGASTRODUODENOSCOPY N/A 01/01/2017   Procedure: ESOPHAGOGASTRODUODENOSCOPY (EGD);  Surgeon: Laurence Spates, MD;  [facility] END[facility]COPY;  Service: Endoscopy;  Laterality: N/A;   MULTIPLE TOOTH EXTRACTIONS     TONSILLECTOMY     TOOTH EXTRACTION Bilateral 07/31/2020   Procedure: DENTAL RESTORATION/EXTRACTIONS;  Surgeon: Diona Browner, DDS;  Location: [place];  Service: Oral Surgery;  Laterality: Bilateral;    Family History  Problem Relation Age of Onset   Cancer Mother        uterine cancer   Emphysema Mother    Heart failure Father     Coronary artery disease Father    Emphysema Father    Emphysema Sister    Cancer Brother        neck/throat cancer   Emphysema Brother     Social History   Socioeconomic History   Marital status: Single    Spouse name: Not on file   Number of children: 0   Years of education: Not on file   Highest education level: Not on file  Occupational History   Occupation: Retired    Comment: Dealer  Tobacco Use   Smoking status: Former    Packs/day: 3.00    Years: 59.00    Pack years: 177.00    Types: Cigarettes    Start date: 1958    Quit date: 2017    Years since quitting: 6.3   Smokeless tobacco: Never   Tobacco comments:    quit smoking cigarettes in 2015  Vaping Use   Vaping Use: Never used  Substance and Sexual Activity   Alcohol use: No    Comment: states he quit many years ago   Drug use: No    Comment: previous h/o cocaine use, denies since 2006   Sexual activity: Not Currently  Other Topics Concern   Not on file  Social History Narrative   ** Merged History Encounter **  Social Determinants of Health   Financial Resource Strain: Not on file  Food Insecurity: Not on file  Transportation Needs: Not on file  Physical Activity: Not on file  Stress: Not on file  Social Connections: Not on file  Intimate Partner Violence: Not on file     Prior to Admission medications   Medication Sig Start Date End Date Taking? Authorizing Provider  acetaminophen (TYLENOL) 500 MG tablet Take 1,000 mg by mouth every 6 (six) hours as needed for moderate pain or headache.    [provider]  albuterol (VENTOLIN HFA) 108 (90 Base) MCG/ACT inhaler Inhale 2 puffs into the lungs every 4 (four) hours as needed for wheezing or shortness of breath. 01/12/22   Spero Geralds, MD  atorvastatin (LIPITOR) 40 MG tablet Take 1 tablet (40 mg total) by mouth daily. 10/02/19   Fay Records, MD  BETA CAROTENE PO Take 1 tablet by mouth once a week.     [provider]   budesonide-formoterol (SYMBICORT) 160-4.5 MCG/ACT inhaler Inhale 2 puffs into the lungs 2 (two) times daily. 12/29/21   Spero Geralds, MD  Carboxymethylcellul-Glycerin (LUBRICATING EYE DROPS OP) Place 1 drop into both eyes daily as needed (dry eyes).    [provider]  celecoxib (CELEBREX) 100 MG capsule Take 100 mg by mouth daily. 03/03/21   [provider]  dextromethorphan-guaiFENesin (MUCINEX DM) 30-600 MG per 12 hr tablet Take 1 tablet by mouth every 12 (twelve) hours as needed for cough (mucus build up).     [provider]  furosemide (LASIX) 40 MG tablet Take 40 mg by mouth every evening.  11/04/16   [provider]  Ipratropium-Albuterol (COMBIVENT) 20-100 MCG/ACT AERS respimat Inhale 1 puff into the lungs every 6 (six) hours as needed for wheezing or shortness of breath.     [provider]  ipratropium-albuterol (DUONEB) 0.5-2.5 (3) MG/3ML SOLN USE 1 VIAL VIA NEBULIZER EVERY 6 HOURS AS NEEDED FOR WHEEZING OR SHORTNESS OF BREATH 06/22/21   Spero Geralds, MD  ipratropium-albuterol (DUONEB) 0.5-2.5 (3) MG/3ML SOLN Take 3 mLs by nebulization every 4 (four) hours as needed. 07/13/21   Spero Geralds, MD  lisinopril (PRINIVIL,ZESTRIL) 5 MG tablet Take 5 mg by mouth daily.    [provider]  lisinopril (ZESTRIL) 5 MG tablet Take 5 mg by mouth daily.    [provider]  Multiple Vitamin (MULTIVITAMIN WITH MINERALS) TABS tablet Take 1 tablet by mouth every other day. Alive    [provider]  potassium chloride (K-DUR,KLOR-CON) 10 MEQ tablet Take 10 mEq by mouth every evening.    [provider]  Simethicone 125 MG TABS Take 125 mg by mouth daily as needed (gas).    [provider]  SYMBICORT 160-4.5 MCG/ACT inhaler Inhale 2 puffs into the lungs 2 (two) times daily. 07/13/21   Spero Geralds, MD  trolamine salicylate (ASPERCREME) 10 % cream Apply 1 application topically as needed for muscle pain.     [provider]  Vitamin D, Ergocalciferol, (DRISDOL) 1.25 MG (50000 UT) CAPS capsule Take 5,000 Units by mouth every Monday. 02/02/19   [provider]  Vitamin D, Ergocalciferol, (DRISDOL) 1.25 MG (50000 UT) CAPS capsule Take 50,000 Units by mouth every Monday.    [provider]    Allergies  Allergen Reactions   Atenolol Shortness Of Breath    REVIEW OF SYSTEMS:  General: no fevers/chills/night sweats Eyes: no blurry vision, diplopia, or amaurosis ENT: no  sore throat or hearing loss Resp: no cough, wheezing, or hemoptysis CV: no edema or palpitations GI: no abdominal pain, nausea, vomiting, diarrhea, or constipation GU: no dysuria, frequency, or hematuria Skin: no rash Neuro: no headache, numbness, tingling, or weakness of extremities Musculoskeletal: no joint pain or swelling Heme: no bleeding, DVT, or easy bruising Endo: no polydipsia or polyuria  There were no vitals taken for this visit.  PHYSICAL EXAM: GEN:  AO x 3 in no acute distress HEENT: normal Dentition: Normal*** Neck: JVP normal. +2***carotid upstrokes without bruits. No thyromegaly. Lungs: equal expansion, clear bilaterally CV: Apex is discrete and nondisplaced, RRR without murmur or gallop*** Abd: soft, non-tender, non-distended; no bruit; positive bowel sounds Ext: no edema, ecchymoses, or cyanosis Vascular: 2+ femoral pulses, 2+ radial pulses       Skin: warm and dry without rash Neuro: CN II-XII grossly intact; motor and sensory grossly intact    DATA AND STUDIES:  EKG:  ***  2D ECHO: ***  CARDIAC CATH: ***  STS RISK CALCULATOR: ***  NHYA CLASS: ***    ASSESSMENT AND PLAN:   No diagnosis found.   I have personally reviewed the patients imaging data as summarized above.  I have reviewed the natural history of aortic stenosis with the patient and family members who are present today. We have discussed the limitations of medical therapy and the poor prognosis  associated with symptomatic aortic stenosis. We have also reviewed potential treatment options, including palliative medical therapy, conventional surgical aortic valve replacement, and transcatheter aortic valve replacement. We discussed treatment options in the context of this patient's specific comorbid medical conditions.   All of the patient's questions were answered today. Will make further recommendations based on the results of studies outlined above.   Total time spent with patient today *** minutes. This includes reviewing records, evaluating the patient and coordinating care.   Early Osmond, MD  02/08/2022 12:07 PM    [facility] [address], [place], [place]  [address] Phone: [phone]; Fax: [phone]

## 2022-02-08 NOTE — Progress Notes (Signed)
? ?Cardiology Office Note ? ? ?Date:  02/08/2022  ? ?ID:  [name] [name], DOB 13-Jan-1946, MRN [id number] ? ?PCP:  Nolene Ebbs, MD  ?Cardiologist:   Dorris Carnes, MD  ? ?F/u of AV dz    ? ?  ?History of Present Illness: ?[name] [name] is a 76 y.o. male with a history of bicuspid AV and AS (s/p AVR in 2007  25 mm bovine prosthesis, 2007 ), CAD (s/p SVG to RCA 2007)  dilated ascending aorta, COPD, hypertension, atherosclerosis   The patient also has a history of moderate LV dysfunction in the past with normalization o LVEF  ? I last saw the pt as a televisit in June 2022   ? ?Since then he has been seen by [name] for follow up of aneurysm  Aorta remains stable   ? ?The pt says he has developed worsening SOB     Takes activities as tolerated    Denies CP   No PND   Notes occasional dizziness but no syncope    ? ? ?Current Meds  ?Medication Sig  ? acetaminophen (TYLENOL) 500 MG tablet Take 1,000 mg by mouth every 6 (six) hours as needed for moderate pain or headache.  ? albuterol (VENTOLIN HFA) 108 (90 Base) MCG/ACT inhaler Inhale 2 puffs into the lungs every 4 (four) hours as needed for wheezing or shortness of breath.  ? atorvastatin (LIPITOR) 40 MG tablet Take 1 tablet (40 mg total) by mouth daily.  ? BETA CAROTENE PO Take 1 tablet by mouth once a week.   ? budesonide-formoterol (SYMBICORT) 160-4.5 MCG/ACT inhaler Inhale 2 puffs into the lungs 2 (two) times daily.  ? Carboxymethylcellul-Glycerin (LUBRICATING EYE DROPS OP) Place 1 drop into both eyes daily as needed (dry eyes).  ? celecoxib (CELEBREX) 100 MG capsule Take 100 mg by mouth daily.  ? dextromethorphan-guaiFENesin (MUCINEX DM) 30-600 MG per 12 hr tablet Take 1 tablet by mouth every 12 (twelve) hours as needed for cough (mucus build up).   ? furosemide (LASIX) 40 MG tablet Take 40 mg by mouth every evening.   ? Ipratropium-Albuterol (COMBIVENT) 20-100 MCG/ACT AERS respimat Inhale 1 puff into the lungs every 6 (six) hours as needed for wheezing or  shortness of breath.   ? ipratropium-albuterol (DUONEB) 0.5-2.5 (3) MG/3ML SOLN USE 1 VIAL VIA NEBULIZER EVERY 6 HOURS AS NEEDED FOR WHEEZING OR SHORTNESS OF BREATH  ? ipratropium-albuterol (DUONEB) 0.5-2.5 (3) MG/3ML SOLN Take 3 mLs by nebulization every 4 (four) hours as needed.  ? lisinopril (PRINIVIL,ZESTRIL) 5 MG tablet Take 5 mg by mouth daily.  ? lisinopril (ZESTRIL) 5 MG tablet Take 5 mg by mouth daily.  ? Multiple Vitamin (MULTIVITAMIN WITH MINERALS) TABS tablet Take 1 tablet by mouth every other day. Alive  ? potassium chloride (K-DUR,KLOR-CON) 10 MEQ tablet Take 10 mEq by mouth every evening.  ? Simethicone 125 MG TABS Take 125 mg by mouth daily as needed (gas).  ? SYMBICORT 160-4.5 MCG/ACT inhaler Inhale 2 puffs into the lungs 2 (two) times daily.  ? trolamine salicylate (ASPERCREME) 10 % cream Apply 1 application topically as needed for muscle pain.  ? Vitamin D, Ergocalciferol, (DRISDOL) 1.25 MG (50000 UT) CAPS capsule Take 5,000 Units by mouth every Monday.  ? Vitamin D, Ergocalciferol, (DRISDOL) 1.25 MG (50000 UT) CAPS capsule Take 50,000 Units by mouth every Monday.  ? ? ? ?Allergies:   Atenolol  ? ?Past Medical History:  ?Diagnosis Date  ? Aortic stenosis   ? Aortic  stenosis   ? s/p bioprosthetic valve replacement  ? Arthritis   ? Ascending aortic aneurysm ([place])   ? CAD (coronary artery disease) of bypass graft   ? s/p SVG to dRCA  ? Cardiomyopathy 2006  ? EF 30%; has history of CHF  ? CHF (congestive heart failure) ([place])   ? COPD (chronic obstructive pulmonary disease) ([place])   ? COPD (chronic obstructive pulmonary disease) ([place])   ? FEV1/FVC -> 0.52 (69% of predicted)  ? Dyspnea   ? HX: COPD  ? GERD (gastroesophageal reflux disease)   ? Heart murmur   ? Hypertension   ? Lesion of buccal mucosa   ? right, with non-restoable abscessed teeth  ? Osteoarthritis   ? Tobacco abuse   ? Wears glasses   ? ? ?Past Surgical History:  ?Procedure Laterality Date  ? AORTIC VALVE REPLACEMENT    ? Bovine valve  ?  AORTIC VALVE REPLACEMENT    ? bioprosthetic, 01/2006 - cannot find record of surgery (has scar)  ? COLONOSCOPY    ? COLONOSCOPY N/A 01/01/2017  ? Procedure: COLONOSCOPY;  Surgeon: Laurence Spates, MD;  [facility] ENDOSCOPY;  Service: Endoscopy;  Laterality: N/A;  ? ESOPHAGOGASTRODUODENOSCOPY N/A 01/01/2017  ? Procedure: ESOPHAGOGASTRODUODENOSCOPY (EGD);  Surgeon: Laurence Spates, MD;  Location: [facility] ENDOSCOPY;  Service: Endoscopy;  Laterality: N/A;  ? MULTIPLE TOOTH EXTRACTIONS    ? TONSILLECTOMY    ? TOOTH EXTRACTION Bilateral 07/31/2020  ? Procedure: DENTAL RESTORATION/EXTRACTIONS;  Surgeon: Diona Browner, DDS;  Location: [facility];  Service: Oral Surgery;  Laterality: Bilateral;  ? ? ? ?Social History:  The patient  reports that he quit smoking about 6 years ago. His smoking use included cigarettes. He started smoking about 65 years ago. He has a 177.00 pack-year smoking history. He has never used smokeless tobacco. He reports that he does not drink alcohol and does not use drugs.  ? ?Family History:  The patient's family history includes Cancer in his brother and mother; Coronary artery disease in his father; Emphysema in his brother, father, mother, and sister; Heart failure in his father.  ? ? ?ROS:  Please see the history of present illness. All other systems are reviewed and  Negative to the above problem except as noted.  ? ? ?PHYSICAL EXAM: ?VS:  BP (!) 144/78   Pulse 76   Ht '5\' 6"'$  (1.676 m)   SpO2 95%   BMI 23.52 kg/m?   ?GEN: Elderly 76 yo male in no acute distress  ?HEENT: normal  ?Neck: JVP appears normal     ?Cardiac: Distant Heart sounds  RRR; Gr II/VI systolic murmur , rubs, or gallops  Tr LE edema  ?Respiratory: Decreased airflwo bilaterally   No rales   No wheezes ?GI: soft, nontender, nondistended, + BS  No hepatomegaly  ?MS: no deformity Moving all extremities   ?Skin: warm and dry, no rash ?Neuro:  Strength and sensation are intact ?Psych: euthymic mood, full affect ? ? ?EKG:  EKG is ordered   SR 76 bpm    Frequent PVCs    ST T wave changes, consider ischemia   Lateral MI ? ?Echo   02/08/22 ? ? 1. Left ventricular ejection fraction, by estimation, is 20 to 25%. The  ?left ventricle has severely decreased function. The left ventricle  ?demonstrates global hypokinesis. The left ventricular internal cavity size  ?was moderately dilated. There is mild  ?left ventricular hypertrophy. Left ventricular diastolic parameters are  ?consistent with Grade I diastolic dysfunction (impaired relaxation).  ?  Elevated left atrial pressure.  ? 2. Right ventricular systolic function is moderately reduced. The right  ?ventricular size is mildly enlarged.  ? 3. Left atrial size was moderately dilated.  ? 4. Right atrial size was mildly dilated.  ? 5. The mitral valve is normal in structure. Mild mitral valve  ?regurgitation. No evidence of mitral stenosis.  ? 6. The aortic valve has been repaired/replaced. Aortic valve  ?regurgitation is trivial. Moderate aortic valve stenosis. There is a valve  ?present in the aortic position. Procedure Date: 01/18/06.  ? 7. Aortic dilatation noted. There is moderate dilatation of the aortic  ?root, measuring 50 mm. There is moderate dilatation of the ascending  ?aorta, measuring 46 mm.  ? 8. The inferior vena cava is normal in size with greater than 50%  ?respiratory variability, suggesting right atrial pressure of 3 mmHg.  ? ?Echo 03/11/22 ? ?1. The aortic valve has been repaired/replaced. Aortic valve regurgitation is not visualized. ?EOA 1.23 cm2, iEOA 0.69 cm2/m2, AT 105 msec, DVI 0.25. Findings consistent with prosthetic ?valve obstruction. There is a 25 mm bovine pericardial valve present in the aortic position. ?Procedure Date: 01/18/2006 (Dr. Roxan Hockey). Aortic valve mean gradient measures 23.0 ?mmHg. Aortic valve Vmax measures 3.14 m/s. ?2. Aortic dilatation noted. There is severe aneurysmal dilatation of the aortic root, measuring 50 ?mm. There is moderate aneurysmal dilatation of the ascending  aorta, measuring 48 mm. ?These measurements are approximate given suboptimal image quality. Recommend gated ?CTA aorta for best assessment of aortic root, given relative change since last echo and CTA. ?3. L

## 2022-02-09 ENCOUNTER — Telehealth: Payer: Self-pay

## 2022-02-09 DIAGNOSIS — R7989 Other specified abnormal findings of blood chemistry: Secondary | ICD-10-CM

## 2022-02-09 DIAGNOSIS — Z79899 Other long term (current) drug therapy: Secondary | ICD-10-CM

## 2022-02-09 LAB — CBC
Hematocrit: 37.2 % — ABNORMAL LOW (ref 37.5–51.0)
Hemoglobin: 11.8 g/dL — ABNORMAL LOW (ref 13.0–17.7)
MCH: 26.2 pg — ABNORMAL LOW (ref 26.6–33.0)
MCHC: 31.7 g/dL (ref 31.5–35.7)
MCV: 83 fL (ref 79–97)
Platelets: 193 10*3/uL (ref 150–450)
RBC: 4.5 x10E6/uL (ref 4.14–5.80)
RDW: 16 % — ABNORMAL HIGH (ref 11.6–15.4)
WBC: 6.4 10*3/uL (ref 3.4–10.8)

## 2022-02-09 LAB — COMPREHENSIVE METABOLIC PANEL
ALT: 13 IU/L (ref 0–44)
AST: 18 IU/L (ref 0–40)
Albumin/Globulin Ratio: 2 (ref 1.2–2.2)
Albumin: 4.3 g/dL (ref 3.7–4.7)
Alkaline Phosphatase: 38 IU/L — ABNORMAL LOW (ref 44–121)
BUN/Creatinine Ratio: 12 (ref 10–24)
BUN: 12 mg/dL (ref 8–27)
Bilirubin Total: 0.3 mg/dL (ref 0.0–1.2)
CO2: 23 mmol/L (ref 20–29)
Calcium: 9.1 mg/dL (ref 8.6–10.2)
Chloride: 101 mmol/L (ref 96–106)
Creatinine, Ser: 0.98 mg/dL (ref 0.76–1.27)
Globulin, Total: 2.2 g/dL (ref 1.5–4.5)
Glucose: 92 mg/dL (ref 70–99)
Potassium: 4.3 mmol/L (ref 3.5–5.2)
Sodium: 138 mmol/L (ref 134–144)
Total Protein: 6.5 g/dL (ref 6.0–8.5)
eGFR: 81 mL/min/{1.73_m2} (ref >59)

## 2022-02-09 LAB — PRO B NATRIURETIC PEPTIDE: NT-Pro BNP: 1353 pg/mL — ABNORMAL HIGH (ref 0–376)

## 2022-02-09 MED ORDER — POTASSIUM CHLORIDE CRYS ER 10 MEQ PO TBCR: EXTENDED_RELEASE_TABLET | ORAL | 1 refills | Status: DC

## 2022-02-09 MED ORDER — FUROSEMIDE 40 MG PO TABS: ORAL_TABLET | ORAL | 1 refills | Status: AC

## 2022-02-09 NOTE — Telephone Encounter (Signed)
Spoke with pt and advised CBC and BMET ok per Dr Harrington Challenger but fluid level is up.  Dr Harrington Challenger recommends pt take 2 lasix (80 mg ) with 20 KCL (2 KCl) alternating days with 1 lasix 1 KCL. Get BMET andBNP when seen next week by Dr Ali Lowe.  Lab orders placed.  Pt verbalizes understanding and agrees with current plan. ?

## 2022-02-09 NOTE — Telephone Encounter (Signed)
-----   Message from Fay Records, MD sent at 02/09/2022  3:38 PM EDT ----- ?CBC is OK ?Kidney and liver function areOK ?Fluid is up   ? ?REcomm:   I would recomm that he take 2 lasix (80 mg ) with 20 KCL (2 KCl) alternating with 1 lasix 1 kcl     ?Get BMET andBNP when seen next week    ?

## 2022-02-10 ENCOUNTER — Institutional Professional Consult (permissible substitution): Payer: Medicaid Other | Admitting: Internal Medicine

## 2022-02-14 ENCOUNTER — Institutional Professional Consult (permissible substitution): Payer: Medicaid Other | Admitting: Internal Medicine

## 2022-02-14 ENCOUNTER — Other Ambulatory Visit: Payer: Medicaid Other

## 2022-02-15 ENCOUNTER — Other Ambulatory Visit: Payer: Medicare Other | Admitting: *Deleted

## 2022-02-15 ENCOUNTER — Encounter: Payer: Self-pay | Admitting: Cardiovascular Disease

## 2022-02-15 ENCOUNTER — Ambulatory Visit (INDEPENDENT_AMBULATORY_CARE_PROVIDER_SITE_OTHER): Payer: Medicare Other | Admitting: Cardiovascular Disease

## 2022-02-15 VITALS — BP 138/70 | HR 71 | Ht 66.0 in | Wt 152.6 lb

## 2022-02-15 DIAGNOSIS — Z79899 Other long term (current) drug therapy: Secondary | ICD-10-CM

## 2022-02-15 DIAGNOSIS — I350 Nonrheumatic aortic (valve) stenosis: Secondary | ICD-10-CM | POA: Diagnosis not present

## 2022-02-15 DIAGNOSIS — R7989 Other specified abnormal findings of blood chemistry: Secondary | ICD-10-CM

## 2022-02-15 NOTE — Patient Instructions (Signed)
Medication Instructions:  ?No changes ?*If you need a refill on your cardiac medications before your next appointment, please call your pharmacy* ? ? ?Lab Work: ?Today as scheduled. ? ?If you have labs (blood work) drawn today and your tests are completely normal, you will receive your results only by: ?MyChart Message (if you have MyChart) OR ?A paper copy in the mail ?If you have any lab test that is abnormal or we need to change your treatment, we will call you to review the results. ? ? ?Testing/Procedures: ?Your physician has requested that you have a cardiac catheterization. Cardiac catheterization is used to diagnose and/or treat various heart conditions. Doctors may recommend this procedure for a number of different reasons. The most common reason is to evaluate chest pain. Chest pain can be a symptom of coronary artery disease (CAD), and cardiac catheterization can show whether plaque is narrowing or blocking your heart?s arteries. This procedure is also used to evaluate the valves, as well as measure the blood flow and oxygen levels in different parts of your heart. For further information please visit [email or web address]. Please follow instruction sheet, as given. ? ? ?Follow-Up: ?Per Structural Heart Team ? ?Other Instructions ? ?[facility] ?[address], [address] ?[place] [address] ?Dept: [phone] ?Loc: [phone] ? ?MAKYLE ESLICK  02/15/2022 ? ?You are scheduled for a Cardiac Catheterization on Friday, May 26 with Dr. Lauree Chandler. ? ?1. Please arrive at the Main Entrance A at [facility]: [place], [place] [address] at 8:30 AM (This time is two hours before your procedure to ensure your preparation). Free valet parking service is available.  ? ?Special note: Every effort is made to have your procedure done on time. Please understand that emergencies sometimes delay  scheduled procedures. ? ?2. Diet: Do not eat solid foods after midnight.  You may have clear liquids until 5 AM upon the day of the procedure. ? ?3. Labs: Completed. ? ?4. Medication instructions in preparation for your procedure: ? ?Contrast Allergy: No ? ? ?On the morning of your procedure, take Aspirin and any morning medicines NOT listed above.  You may use sips of water. ? ?5. Plan to go home the same day, you will only stay overnight if medically necessary. ?6. You MUST have a responsible adult to drive you home. ?7. An adult MUST be with you the first 24 hours after you arrive home. ?8. Bring a current list of your medications, and the last time and date medication taken. ?9. Bring ID and current insurance cards. ?10.Please wear clothes that are easy to get on and off and wear slip-on shoes. ? ?Thank you for allowing [place] to care for you! ?  -- [facility] ? ?

## 2022-02-15 NOTE — Progress Notes (Signed)
? ? ?tructural Heart Clinic Consult Note ? ?Chief Complaint  ?Patient presents with  ? New Patient (Initial Visit)  ?  Severe aortic stenosis  ? ?History of Present Illness: 76 yo male with history of non-ischemic cardiomyopathy, GERD, former tobacco abuse, bicuspid aortic valve/aortic stenosis s/p AVR in 2007 with 25 mm bioprosthetic AVR, CAD s/p CABG, severe COPD, thoracic aortic aneurysm and HTN here today as a new consult in the structural heart clinic to discuss his bioprosthetic AVR failure. He is followed in our office by Dr. Harrington Challenger. He underwent AVR in 2007 with a 25 mm bioprosthetic AVR with single vessel bypass to the RCA at that time. Recent worsened dyspnea with exertion. Echo in June 2022 with LVEF=50% and moderate aortic stenosis. Echo May 2022 with LVEF=20-25%. Mild mitral regurgitation. There is likely low flow/low gradient severe aortic stenosis. Mean gradient 19 mmHg, peak gradient 33 mmHg, AVA1.0 cm2, DI 0.20.  ? ?He tells me today that he has progressive dyspnea over the past few months. He has progressive fatigue. No chest pain or LE edema. He uses Lasix every day. He has had full dental extraction. He does not wear his dentures. He lives in [place] alone. He is divorced and has no children. His sister lives here in [place] and helps him. 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  ? ?Primary Care Physician: Nolene Ebbs, MD ?Primary Cardiologist: Harrington Challenger ?Referring Cardiologist: Harrington Challenger ? ?Past Medical History:  ?Diagnosis Date  ? Aortic stenosis   ? Aortic stenosis   ? s/p bioprosthetic valve replacement  ? Arthritis   ? Ascending aortic aneurysm ([place])   ? CAD (coronary artery disease) of bypass graft   ? s/p SVG to dRCA  ? Cardiomyopathy 2006  ? EF 30%; has history of CHF  ? CHF (congestive heart failure) ([place])   ? COPD (chronic obstructive pulmonary disease) ([place])   ? COPD (chronic obstructive pulmonary disease) ([place])   ? FEV1/FVC -> 0.52 (69% of predicted)  ? Dyspnea   ? HX: COPD  ? GERD  (gastroesophageal reflux disease)   ? Heart murmur   ? Hypertension   ? Lesion of buccal mucosa   ? right, with non-restoable abscessed teeth  ? Osteoarthritis   ? Tobacco abuse   ? Wears glasses   ? ? ?Past Surgical History:  ?Procedure Laterality Date  ? AORTIC VALVE REPLACEMENT    ? Bovine valve  ? AORTIC VALVE REPLACEMENT    ? bioprosthetic, 01/2006 - cannot find record of surgery (has scar)  ? COLONOSCOPY    ? COLONOSCOPY N/A 01/01/2017  ? Procedure: COLONOSCOPY;  Surgeon: Laurence Spates, MD;  Location: [facility] ENDOSCOPY;  Service: Endoscopy;  Laterality: N/A;  ? ESOPHAGOGASTRODUODENOSCOPY N/A 01/01/2017  ? Procedure: ESOPHAGOGASTRODUODENOSCOPY (EGD);  Surgeon: Laurence Spates, MD;  Location: [facility] ENDOSCOPY;  Service: Endoscopy;  Laterality: N/A;  ? MULTIPLE TOOTH EXTRACTIONS    ? TONSILLECTOMY    ? TOOTH EXTRACTION Bilateral 07/31/2020  ? Procedure: DENTAL RESTORATION/EXTRACTIONS;  Surgeon: Diona Browner, DDS;  Location: [facility];  Service: Oral Surgery;  Laterality: Bilateral;  ? ? ?Current Outpatient Medications  ?Medication Sig Dispense Refill  ? acetaminophen (TYLENOL) 500 MG tablet Take 1,000 mg by mouth every 6 (six) hours as needed for moderate pain or headache.    ? albuterol (VENTOLIN HFA) 108 (90 Base) MCG/ACT inhaler Inhale 2 puffs into the lungs every 4 (four) hours as needed for wheezing or shortness of breath. 8.5 g 5  ? atorvastatin (LIPITOR) 40 MG tablet Take 1  tablet (40 mg total) by mouth daily. 90 tablet 0  ? BETA CAROTENE PO Take 1 tablet by mouth once a week.     ? Carboxymethylcellul-Glycerin (LUBRICATING EYE DROPS OP) Place 1 drop into both eyes daily as needed (dry eyes).    ? celecoxib (CELEBREX) 100 MG capsule Take 100 mg by mouth daily.    ? dextromethorphan-guaiFENesin (MUCINEX DM) 30-600 MG per 12 hr tablet Take 1 tablet by mouth every 12 (twelve) hours as needed for cough (mucus build up).     ? furosemide (LASIX) 40 MG tablet Take '80mg'$  - 2 tablets by mouth every other day alternating with '40mg'$  - 1  tablet by mouth 135 tablet 1  ? ipratropium-albuterol (DUONEB) 0.5-2.5 (3) MG/3ML SOLN Take 3 mLs by nebulization every 4 (four) hours as needed. 360 mL 5  ? lisinopril (ZESTRIL) 5 MG tablet Take 5 mg by mouth daily.    ? Multiple Vitamin (MULTIVITAMIN WITH MINERALS) TABS tablet Take 1 tablet by mouth every other day. Alive    ? potassium chloride (KLOR-CON M) 10 MEQ tablet Take 2 tablets by mouth on the days you take 2 Lasix tablets and 1 tablet on the days you take 1 Lasix tablet 135 tablet 1  ? Simethicone 125 MG TABS Take 125 mg by mouth daily as needed (gas).    ? SYMBICORT 160-4.5 MCG/ACT inhaler Inhale 2 puffs into the lungs 2 (two) times daily. 10.2 g 5  ? trolamine salicylate (ASPERCREME) 10 % cream Apply 1 application topically as needed for muscle pain.    ? budesonide-formoterol (SYMBICORT) 160-4.5 MCG/ACT inhaler Inhale 2 puffs into the lungs 2 (two) times daily. (Patient not taking: Reported on 02/15/2022) 1 each 5  ? Ipratropium-Albuterol (COMBIVENT) 20-100 MCG/ACT AERS respimat Inhale 1 puff into the lungs every 6 (six) hours as needed for wheezing or shortness of breath.  (Patient not taking: Reported on 02/15/2022)    ? ipratropium-albuterol (DUONEB) 0.5-2.5 (3) MG/3ML SOLN USE 1 VIAL VIA NEBULIZER EVERY 6 HOURS AS NEEDED FOR WHEEZING OR SHORTNESS OF BREATH (Patient not taking: Reported on 02/15/2022) 360 mL 3  ? lisinopril (PRINIVIL,ZESTRIL) 5 MG tablet Take 5 mg by mouth daily. (Patient not taking: Reported on 02/15/2022)    ? Vitamin D, Ergocalciferol, (DRISDOL) 1.25 MG (50000 UT) CAPS capsule Take 5,000 Units by mouth every Monday. (Patient not taking: Reported on 02/15/2022)    ? Vitamin D, Ergocalciferol, (DRISDOL) 1.25 MG (50000 UT) CAPS capsule Take 50,000 Units by mouth every Monday. (Patient not taking: Reported on 02/15/2022)    ? ?No current facility-administered medications for this visit.  ? ? ?Allergies  ?Allergen Reactions  ? Atenolol Shortness Of Breath  ? ? ?Social History   ? ?Socioeconomic History  ? Marital status: Divorced  ?  Spouse name: Not on file  ? Number of children: 0  ? Years of education: Not on file  ? Highest education level: Not on file  ?Occupational History  ? 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 Occupation: Retired Pension scheme manager  ?Tobacco Use  ? Smoking status: Former  ?  Packs/day: 3.00  ?  Years: 59.00  ?  Pack years: 177.00  ?  Types: Cigarettes  ?  Start date: 19  ?  Quit date: 2017  ?  Years since quitting: 6.3  ? Smokeless tobacco: Never  ? Tobacco comments:  ?  quit smoking cigarettes in 2015  ?Vaping Use  ? Vaping Use: Never used  ?Substance and Sexual  Activity  ? Alcohol use: No  ?  Comment: states he quit many years ago  ? Drug use: No  ?  Comment: previous h/o cocaine use, denies since 2006  ? Sexual activity: Not Currently  ?Other Topics Concern  ? Not on file  ?Social History Narrative  ? ** Merged History Encounter **  ?    ? ?Social Determinants of Health  ? ?Financial Resource Strain: Not on file  ?Food Insecurity: Not on file  ?Transportation Needs: Not on file  ?Physical Activity: Not on file  ?Stress: Not on file  ?Social Connections: Not on file  ?Intimate Partner Violence: Not on file  ? ? ?Family History  ?Problem Relation Age of Onset  ? Cancer Mother   ?     uterine cancer  ? Emphysema Mother   ? Heart failure Father   ? Coronary artery disease Father   ? Emphysema Father   ? Emphysema Sister   ? Cancer Brother   ?     neck/throat cancer  ? Emphysema Brother   ? ? ?Review of Systems:  As stated in the HPI and otherwise negative.  ? ?BP 138/70   Pulse 71   Ht '5\' 6"'$  (1.676 m)   Wt 152 lb 9.6 oz (69.2 kg)   SpO2 95%   BMI 24.63 kg/m?  ? ?Physical Examination: ?General: Well developed, well nourished, NAD  ?HEENT: OP clear, mucus membranes moist  ?SKIN: warm, dry. No rashes. ?Neuro: No focal deficits  ?Musculoskeletal: Muscle strength 5/5 all ext  ?Psychiatric: Mood and affect normal  ?Neck: No JVD ?Lungs:Clear bilaterally with poor  air movement.  ?Cardiovascular: Regular rate and rhythm. Systolic murmur.  ?Abdomen:Soft. Bowel sounds present. Non-tender.  ?Extremities: No lower extremity edema.  ? ?EKG:  EKG is not ordered today. ?The ekg from 02/09/22 demonst

## 2022-02-15 NOTE — H&P (View-Only) (Signed)
tructural Heart Clinic Consult Note  Chief Complaint  Patient presents with   New Patient (Initial Visit)    Severe aortic stenosis   History of Present Illness: 76 yo male with history of non-ischemic cardiomyopathy, GERD, former tobacco abuse, bicuspid aortic valve/aortic stenosis s/p AVR in 2007 with 25 mm bioprosthetic AVR, CAD s/p CABG, severe COPD, thoracic aortic aneurysm and HTN here today as a new consult in the structural heart clinic to discuss his bioprosthetic AVR failure. He is followed in our office by Dr. Harrington Challenger. He underwent AVR in 2007 with a 25 mm bioprosthetic AVR with single vessel bypass to the RCA at that time. Recent worsened dyspnea with exertion. Echo in June 2022 with LVEF=50% and moderate aortic stenosis. Echo May 2022 with LVEF=20-25%. Mild mitral regurgitation. There is likely low flow/low gradient severe aortic stenosis. Mean gradient 19 mmHg, peak gradient 33 mmHg, AVA1.0 cm2, DI 0.20.   He tells me today that he has progressive dyspnea over the past few months. He has progressive fatigue. No chest pain or LE edema. He uses Lasix every day. He has had full dental extraction. He does not wear his dentures. He lives in [place] alone. He is divorced and has no children. His sister lives here in [place] and helps him. 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   Primary Care Physician: Nolene Ebbs, MD Primary Cardiologist: Harrington Challenger Referring Cardiologist: Harrington Challenger  Past Medical History:  Diagnosis Date   Aortic stenosis    Aortic stenosis    s/p bioprosthetic valve replacement   Arthritis    Ascending aortic aneurysm ([place])    CAD (coronary artery disease) of bypass graft    s/p SVG to dRCA   Cardiomyopathy 2006   EF 30%; has history of CHF   CHF (congestive heart failure) (HCC)    COPD (chronic obstructive pulmonary disease) (HCC)    COPD (chronic obstructive pulmonary disease) (HCC)    FEV1/FVC -> 0.52 (69% of predicted)   Dyspnea    HX: COPD   GERD  (gastroesophageal reflux disease)    Heart murmur    Hypertension    Lesion of buccal mucosa    right, with non-restoable abscessed teeth   Osteoarthritis    Tobacco abuse    Wears glasses     Past Surgical History:  Procedure Laterality Date   AORTIC VALVE REPLACEMENT     Bovine valve   AORTIC VALVE REPLACEMENT     bioprosthetic, 01/2006 - cannot find record of surgery (has scar)   COLONOSCOPY     COLONOSCOPY N/A 01/01/2017   Procedure: COLONOSCOPY;  Surgeon: Laurence Spates, MD;  Location: [place];  Service: Endoscopy;  Laterality: N/A;   ESOPHAGOGASTRODUODENOSCOPY N/A 01/01/2017   Procedure: ESOPHAGOGASTRODUODENOSCOPY (EGD);  Surgeon: Laurence Spates, MD;  Location: [facility] ENDOSCOPY;  Service: Endoscopy;  Laterality: N/A;   MULTIPLE TOOTH EXTRACTIONS     TONSILLECTOMY     TOOTH EXTRACTION Bilateral 07/31/2020   Procedure: DENTAL RESTORATION/EXTRACTIONS;  Surgeon: Diona Browner, DDS;  Location: [place];  Service: Oral Surgery;  Laterality: Bilateral;    Current Outpatient Medications  Medication Sig Dispense Refill   acetaminophen (TYLENOL) 500 MG tablet Take 1,000 mg by mouth every 6 (six) hours as needed for moderate pain or headache.     albuterol (VENTOLIN HFA) 108 (90 Base) MCG/ACT inhaler Inhale 2 puffs into the lungs every 4 (four) hours as needed for wheezing or shortness of breath. 8.5 g 5   atorvastatin (LIPITOR) 40 MG tablet Take 1  tablet (40 mg total) by mouth daily. 90 tablet 0   BETA CAROTENE PO Take 1 tablet by mouth once a week.      Carboxymethylcellul-Glycerin (LUBRICATING EYE DROPS OP) Place 1 drop into both eyes daily as needed (dry eyes).     celecoxib (CELEBREX) 100 MG capsule Take 100 mg by mouth daily.     dextromethorphan-guaiFENesin (MUCINEX DM) 30-600 MG per 12 hr tablet Take 1 tablet by mouth every 12 (twelve) hours as needed for cough (mucus build up).      furosemide (LASIX) 40 MG tablet Take '80mg'$  - 2 tablets by mouth every other day alternating with '40mg'$  - 1  tablet by mouth 135 tablet 1   ipratropium-albuterol (DUONEB) 0.5-2.5 (3) MG/3ML SOLN Take 3 mLs by nebulization every 4 (four) hours as needed. 360 mL 5   lisinopril (ZESTRIL) 5 MG tablet Take 5 mg by mouth daily.     Multiple Vitamin (MULTIVITAMIN WITH MINERALS) TABS tablet Take 1 tablet by mouth every other day. Alive     potassium chloride (KLOR-CON M) 10 MEQ tablet Take 2 tablets by mouth on the days you take 2 Lasix tablets and 1 tablet on the days you take 1 Lasix tablet 135 tablet 1   Simethicone 125 MG TABS Take 125 mg by mouth daily as needed (gas).     SYMBICORT 160-4.5 MCG/ACT inhaler Inhale 2 puffs into the lungs 2 (two) times daily. 10.2 g 5   trolamine salicylate (ASPERCREME) 10 % cream Apply 1 application topically as needed for muscle pain.     budesonide-formoterol (SYMBICORT) 160-4.5 MCG/ACT inhaler Inhale 2 puffs into the lungs 2 (two) times daily. (Patient not taking: Reported on 02/15/2022) 1 each 5   Ipratropium-Albuterol (COMBIVENT) 20-100 MCG/ACT AERS respimat Inhale 1 puff into the lungs every 6 (six) hours as needed for wheezing or shortness of breath.  (Patient not taking: Reported on 02/15/2022)     ipratropium-albuterol (DUONEB) 0.5-2.5 (3) MG/3ML SOLN USE 1 VIAL VIA NEBULIZER EVERY 6 HOURS AS NEEDED FOR WHEEZING OR SHORTNESS OF BREATH (Patient not taking: Reported on 02/15/2022) 360 mL 3   lisinopril (PRINIVIL,ZESTRIL) 5 MG tablet Take 5 mg by mouth daily. (Patient not taking: Reported on 02/15/2022)     Vitamin D, Ergocalciferol, (DRISDOL) 1.25 MG (50000 UT) CAPS capsule Take 5,000 Units by mouth every Monday. (Patient not taking: Reported on 02/15/2022)     Vitamin D, Ergocalciferol, (DRISDOL) 1.25 MG (50000 UT) CAPS capsule Take 50,000 Units by mouth every Monday. (Patient not taking: Reported on 02/15/2022)     No current facility-administered medications for this visit.    Allergies  Allergen Reactions   Atenolol Shortness Of Breath    Social History    Socioeconomic History   Marital status: Divorced    Spouse name: Not on file   Number of children: 0   Years of education: Not on file   Highest education level: Not on file  Occupational History   Occupation: Retired    Comment: Dealer   Occupation: Retired Pension scheme manager  Tobacco Use   Smoking status: Former    Packs/day: 3.00    Years: 59.00    Pack years: 177.00    Types: Cigarettes    Start date: 1958    Quit date: 2017    Years since quitting: 6.3   Smokeless tobacco: Never   Tobacco comments:    quit smoking cigarettes in 2015  Vaping Use   Vaping Use: Never used  Substance and Sexual  Activity   Alcohol use: No    Comment: states he quit many years ago   Drug use: No    Comment: previous h/o cocaine use, denies since 2006   Sexual activity: Not Currently  Other Topics Concern   Not on file  Social History Narrative   ** Merged History Encounter **       Social Determinants of Health   Financial Resource Strain: Not on file  Food Insecurity: Not on file  Transportation Needs: Not on file  Physical Activity: Not on file  Stress: Not on file  Social Connections: Not on file  Intimate Partner Violence: Not on file    Family History  Problem Relation Age of Onset   Cancer Mother        uterine cancer   Emphysema Mother    Heart failure Father    Coronary artery disease Father    Emphysema Father    Emphysema Sister    Cancer Brother        neck/throat cancer   Emphysema Brother     Review of Systems:  As stated in the HPI and otherwise negative.   BP 138/70   Pulse 71   Ht '5\' 6"'$  (1.676 m)   Wt 152 lb 9.6 oz (69.2 kg)   SpO2 95%   BMI 24.63 kg/m   Physical Examination: General: Well developed, well nourished, NAD  HEENT: OP clear, mucus membranes moist  SKIN: warm, dry. No rashes. Neuro: No focal deficits  Musculoskeletal: Muscle strength 5/5 all ext  Psychiatric: Mood and affect normal  Neck: No JVD Lungs:Clear bilaterally with poor  air movement.  Cardiovascular: Regular rate and rhythm. Systolic murmur.  Abdomen:Soft. Bowel sounds present. Non-tender.  Extremities: No lower extremity edema.   EKG:  EKG is not ordered today. The ekg from 02/09/22 demonstrates Sinus, PVCs  Echo 02/08/22:  1. Left ventricular ejection fraction, by estimation, is 20 to 25%. The  left ventricle has severely decreased function. The left ventricle  demonstrates global hypokinesis. The left ventricular internal cavity size  was moderately dilated. There is mild  left ventricular hypertrophy. Left ventricular diastolic parameters are  consistent with Grade I diastolic dysfunction (impaired relaxation).  Elevated left atrial pressure.   2. Right ventricular systolic function is moderately reduced. The right  ventricular size is mildly enlarged.   3. Left atrial size was moderately dilated.   4. Right atrial size was mildly dilated.   5. The mitral valve is normal in structure. Mild mitral valve  regurgitation. No evidence of mitral stenosis.   6. The aortic valve has been repaired/replaced. Aortic valve  regurgitation is trivial. Moderate aortic valve stenosis. There is a valve  present in the aortic position. Procedure Date: 01/18/06.   7. Aortic dilatation noted. There is moderate dilatation of the aortic  root, measuring 50 mm. There is moderate dilatation of the ascending  aorta, measuring 46 mm.   8. The inferior vena cava is normal in size with greater than 50%  respiratory variability, suggesting right atrial pressure of 3 mmHg.   FINDINGS   Left Ventricle: Left ventricular ejection fraction, by estimation, is 20  to 25%. The left ventricle has severely decreased function. The left  ventricle demonstrates global hypokinesis. The left ventricular internal  cavity size was moderately dilated.  There is mild left ventricular hypertrophy. Left ventricular diastolic  parameters are consistent with Grade I diastolic dysfunction  (impaired  relaxation). Elevated left atrial pressure.   Right Ventricle: The  right ventricular size is mildly enlarged. Right  ventricular systolic function is moderately reduced.   Left Atrium: Left atrial size was moderately dilated.   Right Atrium: Right atrial size was mildly dilated.   Pericardium: There is no evidence of pericardial effusion.   Mitral Valve: The mitral valve is normal in structure. Mild mitral valve  regurgitation. No evidence of mitral valve stenosis.   Tricuspid Valve: The tricuspid valve is normal in structure. Tricuspid  valve regurgitation is not demonstrated. No evidence of tricuspid  stenosis.   Aortic Valve: The aortic valve has been repaired/replaced. Aortic valve  regurgitation is trivial. Moderate aortic stenosis is present. Aortic  valve mean gradient measures 19.3 mmHg. Aortic valve peak gradient  measures 33.0 mmHg. Aortic valve area, by VTI   measures 1.04 cm. There is a valve present in the aortic position.  Procedure Date: 01/18/06.   Pulmonic Valve: The pulmonic valve was normal in structure. Pulmonic valve  regurgitation is mild. No evidence of pulmonic stenosis.   Aorta: Aortic dilatation noted. There is moderate dilatation of the aortic  root, measuring 50 mm. There is moderate dilatation of the ascending  aorta, measuring 46 mm.   Venous: The inferior vena cava is normal in size with greater than 50%  respiratory variability, suggesting right atrial pressure of 3 mmHg.   IAS/Shunts: No atrial level shunt detected by color flow Doppler.   Additional Comments: Severe global reduction in LV systolic function; s/p  AVR with elevated mean gradient (19 mmHg); AVA 1 cm2; DI 0.20 suggesting  prosthetic aortic valve dysfunction. LV function worse compared to  previous.      LEFT VENTRICLE  PLAX 2D  LVIDd:         6.20 cm   Diastology  LVIDs:         5.30 cm   LV e' medial:    4.46 cm/s  LV PW:         1.20 cm   LV E/e' medial:  15.6   LV IVS:        1.20 cm   LV e' lateral:   6.31 cm/s  LVOT diam:     2.60 cm   LV E/e' lateral: 11.0  LV SV:         68  LV SV Index:   39  LVOT Area:     5.31 cm      RIGHT VENTRICLE             IVC  RV S prime:     10.40 cm/s  IVC diam: 1.50 cm  TAPSE (M-mode): 1.4 cm   LEFT ATRIUM             Index        RIGHT ATRIUM           Index  LA diam:        4.20 cm 2.40 cm/m   RA Pressure: 3.00 mmHg  LA Vol (A2C):   74.2 ml 42.45 ml/m  RA Area:     19.20 cm  LA Vol (A4C):   67.8 ml 38.79 ml/m  RA Volume:   61.20 ml  35.01 ml/m  LA Biplane Vol: 74.7 ml 42.74 ml/m   AORTIC VALVE  AV Area (Vmax):    1.03 cm  AV Area (Vmean):   1.01 cm  AV Area (VTI):     1.04 cm  AV Vmax:           287.33 cm/s  AV Vmean:          204.667 cm/s  AV VTI:            0.653 m  AV Peak Grad:      33.0 mmHg  AV Mean Grad:      19.3 mmHg  LVOT Vmax:         55.90 cm/s  LVOT Vmean:        39.100 cm/s  LVOT VTI:          0.128 m  LVOT/AV VTI ratio: 0.20     AORTA  Ao Root diam: 5.00 cm  Ao Asc diam:  4.60 cm   MITRAL VALVE                TRICUSPID VALVE  MV Area (PHT): 3.11 cm     Estimated RAP:  3.00 mmHg  MV Decel Time: 244 msec  MV E velocity: 69.70 cm/s   SHUNTS  MV A velocity: 128.00 cm/s  Systemic VTI:  0.13 m  MV E/A ratio:  0.54         Systemic Diam: 2.60 cm   Recent Labs: 02/08/2022: ALT 13; BUN 12; Creatinine, Ser 0.98; Hemoglobin 11.8; NT-Pro BNP 1,353; Platelets 193; Potassium 4.3; Sodium 138   Lipid Panel    Component Value Date/Time   CHOL 161 11/30/2020 0920   CHOL 143 08/18/2017 1057   TRIG 69 11/30/2020 0920   HDL 73 11/30/2020 0920   HDL 69 08/18/2017 1057   CHOLHDL 2.2 11/30/2020 0920   VLDL 10 03/18/2016 1016   LDLCALC 73 11/30/2020 0920     Wt Readings from Last 3 Encounters:  02/15/22 152 lb 9.6 oz (69.2 kg)  01/18/22 145 lb 11.2 oz (66.1 kg)  07/19/21 163 lb (73.9 kg)      Assessment and Plan:   1. Severe Aortic Valve Stenosis: He has severe, stage D2  aortic valve stenosis secondary to failure of his bioprosthetic AVR. NYHA class III. I have personally reviewed the echo images. The aortic valve leaflets do not open well. He has severe COPD but there has been a clear change in his dyspnea over the past few months. I think he would benefit from AVR. Given advanced age, prior open heart procedure and severe COPD, he is not a candidate for conventional AVR by surgical approach. I think he may be a good candidate for valve in valve TAVR. He is followed by Dr. Roxan Hockey for dilated ascending aorta which should not change our plan for TAVR.   I have reviewed the natural history of aortic stenosis with the patient and their family members  who are present today. We have discussed the limitations of medical therapy and the poor prognosis associated with symptomatic aortic stenosis. We have reviewed potential treatment options, including palliative medical therapy, conventional surgical aortic valve replacement, and transcatheter aortic valve replacement. We discussed treatment options in the context of the patient's specific comorbid medical conditions.   He would like to proceed with planning for TAVR. I will arrange a right and left heart catheterization at [facility] 02/25/22 at 10;30 am. Risks and benefits of the cath procedure and the valve procedure are reviewed with the patient. After the cath, he will have a cardiac CT, CTA of the chest/abdomen and pelvis and will be referred to see Dr. Cyndia Bent.      Labs/ tests ordered today include:  No orders of the defined types were placed in this encounter.   Disposition:  F/U with the valve team.    Signed, Lauree Chandler, MD 02/15/2022 3:48 PM    [address], [place], [place]  [address] Phone: [phone]; Fax: [phone]

## 2022-02-15 NOTE — Progress Notes (Addendum)
Pre Surgical Assessment: 5 M Walk Test  61M=16.87f  5 Meter Walk Test- trial 1: 7.12 seconds 5 Meter Walk Test- trial 2: 6.25 seconds 5 Meter Walk Test- trial 3: 7.14 seconds 5 Meter Walk Test Average: 6.83 seconds  _______________________   Procedure: Isolated AVR Risk of Mortality: 5.314% Renal Failure: 2.247% Permanent Stroke: 1.356% Prolonged Ventilation: 24.216% DSW Infection: 0.121% Reoperation: 5.182% Morbidity or Mortality: 28.433% Short Length of Stay: 18.707% Long Length of Stay: 13.011%

## 2022-02-16 LAB — BASIC METABOLIC PANEL
BUN/Creatinine Ratio: 18 (ref 10–24)
BUN: 16 mg/dL (ref 8–27)
CO2: 23 mmol/L (ref 20–29)
Calcium: 9.2 mg/dL (ref 8.6–10.2)
Chloride: 103 mmol/L (ref 96–106)
Creatinine, Ser: 0.9 mg/dL (ref 0.76–1.27)
Glucose: 86 mg/dL (ref 70–99)
Potassium: 4.8 mmol/L (ref 3.5–5.2)
Sodium: 141 mmol/L (ref 134–144)
eGFR: 90 mL/min/{1.73_m2} (ref >59)

## 2022-02-16 LAB — PRO B NATRIURETIC PEPTIDE: NT-Pro BNP: 574 pg/mL — ABNORMAL HIGH (ref 0–376)

## 2022-02-18 ENCOUNTER — Institutional Professional Consult (permissible substitution): Payer: Medicaid Other | Admitting: Internal Medicine

## 2022-02-24 ENCOUNTER — Telehealth: Payer: Self-pay | Admitting: *Deleted

## 2022-02-24 NOTE — Telephone Encounter (Signed)
Cardiac Catheterization scheduled at [facility] for: Friday Feb 25, 2022 10:30 AM Arrival time and place: [place] Entrance A at: 8:30 AM   Nothing to eat after midnight prior to procedure, clear liquids until 5 AM day of procedure.  Medication instructions: -Hold:  Lasix/KCl-AM of procedure -Except hold medications usual morning medications can be taken with sips of water including aspirin 81 mg.  Confirmed patient has responsible adult to drive home post procedure and be with patient first 24 hours after arriving home.  Patient reports no new symptoms concerning for COVID-19/no exposure to COVID-19 in the past 10 days.  Reviewed procedure instructions with patient.

## 2022-02-25 ENCOUNTER — Other Ambulatory Visit: Payer: Self-pay

## 2022-02-25 ENCOUNTER — Ambulatory Visit (HOSPITAL_COMMUNITY)
Admission: RE | Disposition: A | Discharge status: Home / Self Care | Payer: Self-pay | Source: Home / Self Care | Attending: Cardiovascular Disease

## 2022-02-25 ENCOUNTER — Encounter (HOSPITAL_COMMUNITY): Payer: Self-pay | Admitting: Cardiovascular Disease

## 2022-02-25 ENCOUNTER — Ambulatory Visit (HOSPITAL_COMMUNITY)
Admission: RE | Admit: 2022-02-25 | Discharge: 2022-02-25 | Disposition: A | Discharge status: Home / Self Care | Payer: Medicare Other | Attending: Cardiovascular Disease | Admitting: Cardiovascular Disease

## 2022-02-25 DIAGNOSIS — I712 Thoracic aortic aneurysm, without rupture, unspecified: Secondary | ICD-10-CM | POA: Insufficient documentation

## 2022-02-25 DIAGNOSIS — Z953 Presence of xenogenic heart valve: Secondary | ICD-10-CM | POA: Diagnosis not present

## 2022-02-25 DIAGNOSIS — T829XXA Unspecified complication of cardiac and vascular prosthetic device, implant and graft, initial encounter: Secondary | ICD-10-CM | POA: Insufficient documentation

## 2022-02-25 DIAGNOSIS — Z951 Presence of aortocoronary bypass graft: Secondary | ICD-10-CM | POA: Insufficient documentation

## 2022-02-25 DIAGNOSIS — I352 Nonrheumatic aortic (valve) stenosis with insufficiency: Secondary | ICD-10-CM | POA: Diagnosis present

## 2022-02-25 DIAGNOSIS — I2510 Atherosclerotic heart disease of native coronary artery without angina pectoris: Secondary | ICD-10-CM | POA: Diagnosis not present

## 2022-02-25 DIAGNOSIS — Z79899 Other long term (current) drug therapy: Secondary | ICD-10-CM | POA: Diagnosis not present

## 2022-02-25 DIAGNOSIS — I2584 Coronary atherosclerosis due to calcified coronary lesion: Secondary | ICD-10-CM | POA: Diagnosis not present

## 2022-02-25 DIAGNOSIS — J449 Chronic obstructive pulmonary disease, unspecified: Secondary | ICD-10-CM | POA: Insufficient documentation

## 2022-02-25 DIAGNOSIS — I350 Nonrheumatic aortic (valve) stenosis: Secondary | ICD-10-CM | POA: Diagnosis not present

## 2022-02-25 DIAGNOSIS — K219 Gastro-esophageal reflux disease without esophagitis: Secondary | ICD-10-CM | POA: Insufficient documentation

## 2022-02-25 DIAGNOSIS — I110 Hypertensive heart disease with heart failure: Secondary | ICD-10-CM | POA: Insufficient documentation

## 2022-02-25 DIAGNOSIS — I428 Other cardiomyopathies: Secondary | ICD-10-CM | POA: Diagnosis not present

## 2022-02-25 DIAGNOSIS — Y831 Surgical operation with implant of artificial internal device as the cause of abnormal reaction of the patient, or of later complication, without mention of misadventure at the time of the procedure: Secondary | ICD-10-CM | POA: Insufficient documentation

## 2022-02-25 DIAGNOSIS — Z952 Presence of prosthetic heart valve: Secondary | ICD-10-CM

## 2022-02-25 DIAGNOSIS — I2582 Chronic total occlusion of coronary artery: Secondary | ICD-10-CM | POA: Diagnosis not present

## 2022-02-25 DIAGNOSIS — I509 Heart failure, unspecified: Secondary | ICD-10-CM | POA: Diagnosis not present

## 2022-02-25 HISTORY — PX: RIGHT HEART CATH AND CORONARY ANGIOGRAPHY: CATH118264

## 2022-02-25 LAB — POCT I-STAT EG7
Acid-base deficit: 1 mmol/L (ref 0.0–2.0)
Bicarbonate: 26.4 mmol/L (ref 20.0–28.0)
Calcium, Ion: 1.27 mmol/L (ref 1.15–1.40)
HCT: 37 % — ABNORMAL LOW (ref 39.0–52.0)
Hemoglobin: 12.6 g/dL — ABNORMAL LOW (ref 13.0–17.0)
O2 Saturation: 72 %
Potassium: 4.2 mmol/L (ref 3.5–5.1)
Sodium: 137 mmol/L (ref 135–145)
TCO2: 28 mmol/L (ref 22–32)
pCO2, Ven: 51.9 mmHg (ref 44–60)
pH, Ven: 7.314 (ref 7.25–7.43)
pO2, Ven: 42 mmHg (ref 32–45)

## 2022-02-25 LAB — POCT I-STAT 7, (LYTES, BLD GAS, ICA,H+H)
Acid-Base Excess: 0 mmol/L (ref 0.0–2.0)
Bicarbonate: 26.8 mmol/L (ref 20.0–28.0)
Calcium, Ion: 1.22 mmol/L (ref 1.15–1.40)
HCT: 37 % — ABNORMAL LOW (ref 39.0–52.0)
Hemoglobin: 12.6 g/dL — ABNORMAL LOW (ref 13.0–17.0)
O2 Saturation: 100 %
Potassium: 4.1 mmol/L (ref 3.5–5.1)
Sodium: 136 mmol/L (ref 135–145)
TCO2: 28 mmol/L (ref 22–32)
pCO2 arterial: 49.9 mmHg — ABNORMAL HIGH (ref 32–48)
pH, Arterial: 7.338 — ABNORMAL LOW (ref 7.35–7.45)
pO2, Arterial: 360 mmHg — ABNORMAL HIGH (ref 83–108)

## 2022-02-25 SURGERY — RIGHT HEART CATH AND CORONARY ANGIOGRAPHY
Anesthesia: LOCAL

## 2022-02-25 MED ORDER — ACETAMINOPHEN 325 MG PO TABS
ORAL_TABLET | ORAL | Status: AC
  Administered 2022-02-25: 650 mg via ORAL
  Filled 2022-02-25: qty 2

## 2022-02-25 MED ORDER — ASPIRIN 81 MG PO CHEW: 81.0000 mg | CHEWABLE_TABLET | ORAL | Status: DC

## 2022-02-25 MED ORDER — FENTANYL CITRATE (PF) 100 MCG/2ML IJ SOLN
INTRAMUSCULAR | Status: AC
Start: 2022-02-25 — End: ?
  Filled 2022-02-25: qty 2

## 2022-02-25 MED ORDER — LIDOCAINE HCL (PF) 1 % IJ SOLN
INTRAMUSCULAR | Status: DC | PRN
  Administered 2022-02-25: 2 mL via INTRADERMAL
  Administered 2022-02-25: 10 mL via INTRADERMAL
  Administered 2022-02-25: 2 mL via INTRADERMAL

## 2022-02-25 MED ORDER — SODIUM CHLORIDE 0.9% FLUSH: 3.0000 mL | INTRAVENOUS | Status: DC | PRN

## 2022-02-25 MED ORDER — SODIUM CHLORIDE 0.9 % IV SOLN: INTRAVENOUS | Status: DC

## 2022-02-25 MED ORDER — VERAPAMIL HCL 2.5 MG/ML IV SOLN
INTRAVENOUS | Status: AC
  Filled 2022-02-25: qty 2

## 2022-02-25 MED ORDER — METOPROLOL TARTRATE 50 MG PO TABS: ORAL_TABLET | ORAL | 0 refills | Status: DC

## 2022-02-25 MED ORDER — MIDAZOLAM HCL 2 MG/2ML IJ SOLN
INTRAMUSCULAR | Status: AC
  Filled 2022-02-25: qty 2

## 2022-02-25 MED ORDER — HYDRALAZINE HCL 20 MG/ML IJ SOLN: 10.0000 mg | INTRAMUSCULAR | Status: DC | PRN

## 2022-02-25 MED ORDER — LIDOCAINE HCL (PF) 1 % IJ SOLN
INTRAMUSCULAR | Status: AC
  Filled 2022-02-25: qty 30

## 2022-02-25 MED ORDER — ONDANSETRON HCL 4 MG/2ML IJ SOLN: 4.0000 mg | Freq: Four times a day (QID) | INTRAMUSCULAR | Status: DC | PRN

## 2022-02-25 MED ORDER — HEPARIN (PORCINE) IN NACL 1000-0.9 UT/500ML-% IV SOLN
INTRAVENOUS | Status: AC
  Filled 2022-02-25: qty 1000

## 2022-02-25 MED ORDER — SODIUM CHLORIDE 0.9% FLUSH: 3.0000 mL | Freq: Two times a day (BID) | INTRAVENOUS | Status: DC

## 2022-02-25 MED ORDER — SODIUM CHLORIDE 0.9 % IV SOLN: 250.0000 mL | INTRAVENOUS | Status: DC | PRN

## 2022-02-25 MED ORDER — SODIUM CHLORIDE 0.9 % WEIGHT BASED INFUSION
3.0000 mL/kg/h | INTRAVENOUS | Status: DC
  Administered 2022-02-25: 3 mL/kg/h via INTRAVENOUS

## 2022-02-25 MED ORDER — IOHEXOL 350 MG/ML SOLN
INTRAVENOUS | Status: DC | PRN
Start: 2022-02-25 — End: 2022-02-25
  Administered 2022-02-25: 70 mL

## 2022-02-25 MED ORDER — HEPARIN (PORCINE) IN NACL 1000-0.9 UT/500ML-% IV SOLN
INTRAVENOUS | Status: DC | PRN
  Administered 2022-02-25 (×2): 500 mL

## 2022-02-25 MED ORDER — HEPARIN SODIUM (PORCINE) 1000 UNIT/ML IJ SOLN
INTRAMUSCULAR | Status: AC
  Filled 2022-02-25: qty 10

## 2022-02-25 MED ORDER — ACETAMINOPHEN 325 MG PO TABS: 650.0000 mg | ORAL_TABLET | ORAL | Status: DC | PRN

## 2022-02-25 MED ORDER — SODIUM CHLORIDE 0.9 % WEIGHT BASED INFUSION: 1.0000 mL/kg/h | INTRAVENOUS | Status: DC

## 2022-02-25 MED ORDER — FENTANYL CITRATE (PF) 100 MCG/2ML IJ SOLN
INTRAMUSCULAR | Status: DC | PRN
  Administered 2022-02-25 (×2): 25 ug via INTRAVENOUS

## 2022-02-25 MED ORDER — ASPIRIN 81 MG PO CHEW
81.0000 mg | CHEWABLE_TABLET | ORAL | Status: AC
  Administered 2022-02-25: 81 mg via ORAL
  Filled 2022-02-25: qty 1

## 2022-02-25 MED ORDER — MIDAZOLAM HCL 2 MG/2ML IJ SOLN
INTRAMUSCULAR | Status: DC | PRN
  Administered 2022-02-25 (×2): 1 mg via INTRAVENOUS

## 2022-02-25 SURGICAL SUPPLY — 20 items
CATH BALLN WEDGE 5F 110CM (CATHETERS) ×1 IMPLANT
CATH INFINITI 5FR AL1 (CATHETERS) ×1 IMPLANT
CATH INFINITI 5FR MULTPACK ANG (CATHETERS) ×1 IMPLANT
CATH LAUNCHER 5F EBU4.0 (CATHETERS) ×1 IMPLANT
CLOSURE MYNX CONTROL 5F (Vascular Products) ×1 IMPLANT
GLIDESHEATH SLEND SS 6F .021 (SHEATH) ×1 IMPLANT
GUIDEWIRE .025 260CM (WIRE) ×1 IMPLANT
GUIDEWIRE INQWIRE 1.5J.035X260 (WIRE) IMPLANT
INQWIRE 1.5J .035X260CM (WIRE) ×2
KIT HEART LEFT (KITS) ×3 IMPLANT
KIT MICROPUNCTURE NIT STIFF (SHEATH) ×1 IMPLANT
PACK CARDIAC CATHETERIZATION (CUSTOM PROCEDURE TRAY) ×3 IMPLANT
SHEATH GLIDE SLENDER 4/5FR (SHEATH) ×1 IMPLANT
SHEATH PINNACLE 5F 10CM (SHEATH) ×1 IMPLANT
SHEATH PINNACLE 6F 10CM (SHEATH) ×1 IMPLANT
SHEATH PROBE COVER 6X72 (BAG) ×1 IMPLANT
TRANSDUCER W/STOPCOCK (MISCELLANEOUS) ×3 IMPLANT
TUBING CIL FLEX 10 FLL-RA (TUBING) ×3 IMPLANT
WIRE EMERALD 3MM-J .035X150CM (WIRE) ×1 IMPLANT
WIRE MICROINTRODUCER 60CM (WIRE) ×2 IMPLANT

## 2022-02-25 NOTE — Discharge Instructions (Signed)
Femoral Site Care This sheet gives you information about how to care for yourself after your procedure. Your health care provider may also give you more specific instructions. If you have problems or questions, contact your health care provider. What can I expect after the procedure?  After the procedure, it is common to have: Bruising that usually fades within 1-2 weeks. Tenderness at the site. Follow these instructions at home: Wound care Follow instructions from your health care provider about how to take care of your insertion site. Make sure you: Wash your hands with soap and water before you change your bandage (dressing). If soap and water are not available, use hand sanitizer. Remove your dressing as told by your health care provider. 24 hours Do not take baths, swim, or use a hot tub until your health care provider approves. You may shower 24-48 hours after the procedure or as told by your health care provider. Gently wash the site with plain soap and water. Pat the area dry with a clean towel. Do not rub the site. This may cause bleeding. Do not apply powder or lotion to the site. Keep the site clean and dry. Check your femoral site every day for signs of infection. Check for: Redness, swelling, or pain. Fluid or blood. Warmth. Pus or a bad smell. Activity For the first 2-3 days after your procedure, or as long as directed: Avoid climbing stairs as much as possible. Do not squat. Do not lift anything that is heavier than 10 lb (4.5 kg), or the limit that you are told, until your health care provider says that it is safe. For 5 days Rest as directed. Avoid sitting for a long time without moving. Get up to take short walks every 1-2 hours. Do not drive for 24 hours if you were given a medicine to help you relax (sedative). General instructions Take over-the-counter and prescription medicines only as told by your health care provider. Keep all follow-up visits as told by your  health care provider. This is important. Contact a health care provider if you have: A fever or chills. You have redness, swelling, or pain around your insertion site. Get help right away if: The catheter insertion area swells very fast. You pass out. You suddenly start to sweat or your skin gets clammy. The catheter insertion area is bleeding, and the bleeding does not stop when you hold steady pressure on the area. The area near or just beyond the catheter insertion site becomes pale, cool, tingly, or numb. These symptoms may represent a serious problem that is an emergency. Do not wait to see if the symptoms will go away. Get medical help right away. Call your local emergency services (911 in the [place] Do not drive yourself to the hospital. Summary After the procedure, it is common to have bruising that usually fades within 1-2 weeks. Check your femoral site every day for signs of infection. Do not lift anything that is heavier than 10 lb (4.5 kg), or the limit that you are told, until your health care provider says that it is safe. This information is not intended to replace advice given to you by your health care provider. Make sure you discuss any questions you have with your health care provider. Document Revised: 10/02/2017 Document Reviewed: 10/02/2017 Elsevier Patient Education  2020 [employer] Inc[employer]  

## 2022-02-25 NOTE — Progress Notes (Signed)
Skin tear/Razor burn Right groin

## 2022-02-25 NOTE — Interval H&P Note (Signed)
History and Physical Interval Note:  02/25/2022 10:09 AM  [name] [name]  has presented today for surgery, with the diagnosis of aortic stenosis.  The various methods of treatment have been discussed with the patient and family. After consideration of risks, benefits and other options for treatment, the patient has consented to  Procedure(s): RIGHT/LEFT HEART CATH AND CORONARY/GRAFT ANGIOGRAPHY (N/A) as a surgical intervention.  The patient's history has been reviewed, patient examined, no change in status, stable for surgery.  I have reviewed the patient's chart and labs.  Questions were answered to the patient's satisfaction.    Cath Lab Visit (complete for each Cath Lab visit)  Clinical Evaluation Leading to the Procedure:   ACS: No.  Non-ACS:    Anginal Classification: CCS II  Anti-ischemic medical therapy: No Therapy  Non-Invasive Test Results: No non-invasive testing performed  Prior CABG: Previous CABG        Lauree Chandler

## 2022-02-25 NOTE — Progress Notes (Signed)
Multiple attempts to access radial artery, unsuccessful. Going in right groin.

## 2022-03-01 ENCOUNTER — Telehealth: Payer: Self-pay | Admitting: Cardiovascular Disease

## 2022-03-01 MED ORDER — IVABRADINE HCL 5 MG PO TABS: ORAL_TABLET | ORAL | 0 refills | Status: DC

## 2022-03-01 MED FILL — Verapamil HCl IV Soln 2.5 MG/ML: INTRAVENOUS | Qty: 2 | Status: AC

## 2022-03-01 NOTE — Telephone Encounter (Signed)
Discussed with [facility] and order for Metoprolol Tartrate was discontinued. Order given for Ivabradine '5mg'$  take 2 tablets by mouth around 8:30 AM the morning of CT. Rx sent to pharmacy. Left message for pt and sister to contact the office.

## 2022-03-01 NOTE — Telephone Encounter (Signed)
Ew Message:     Patient was prescribed Metoprolol. Patient says he can not take Beta Blockers, he is allergic to them. What do you want the patient to take please.

## 2022-03-02 NOTE — Telephone Encounter (Signed)
I attempted to reach the pt and his sister in regards to Rx in preparation for CT scan tomorrow.  I left a message for both of them in regards to a new medication being called into the pt's pharmacy.

## 2022-03-02 NOTE — Telephone Encounter (Signed)
Attempted to reach the pt again in regards to medication prior to CT scan. No answer, but I did leave a voicemail in regards to Ivabradine and instructions.

## 2022-03-03 ENCOUNTER — Encounter (HOSPITAL_COMMUNITY): Payer: Self-pay

## 2022-03-03 ENCOUNTER — Ambulatory Visit (HOSPITAL_COMMUNITY)
Admission: RE | Admit: 2022-03-03 | Discharge: 2022-03-03 | Disposition: A | Discharge status: Home / Self Care | Payer: Medicare Other | Source: Ambulatory Visit | Attending: Cardiovascular Disease | Admitting: Cardiovascular Disease

## 2022-03-03 DIAGNOSIS — Z952 Presence of prosthetic heart valve: Secondary | ICD-10-CM | POA: Diagnosis present

## 2022-03-03 DIAGNOSIS — I712 Thoracic aortic aneurysm, without rupture, unspecified: Secondary | ICD-10-CM | POA: Diagnosis present

## 2022-03-03 DIAGNOSIS — I350 Nonrheumatic aortic (valve) stenosis: Secondary | ICD-10-CM | POA: Insufficient documentation

## 2022-03-03 MED ORDER — IOHEXOL 350 MG/ML SOLN
100.0000 mL | Freq: Once | INTRAVENOUS | Status: AC | PRN
  Administered 2022-03-03: 100 mL via INTRAVENOUS

## 2022-03-07 ENCOUNTER — Encounter: Payer: Self-pay | Admitting: Physician Assistant

## 2022-03-15 ENCOUNTER — Telehealth: Payer: Self-pay

## 2022-03-15 NOTE — Telephone Encounter (Signed)
  [facility]   MULTIDISCIPLINARY HEART VALVE TEAM  I contacted the pt to make him aware of upcoming cardiac surgery appointment with Dr Cyndia Bent on 04/20/2022. The pt is currently with his sister, Arbie Cookey, who helps with his medical appointments.  The pt states that he has decided not to proceed with TAVR.  He feels that the risk of stroke or needing a pacemaker with TAVR would further lessen his quality of life and he is currently satisfied with living out the remainder of his time as he is right now.  The pt's sister also confirms that they have discussed his decision and she is in support of not having TAVR.  I will cancel Dr Cyndia Bent appointment.  I advised the pt that he does need to continue with routine cardiology follow-up and that he should arrange follow-up with Dr Harrington Challenger. The pt has my contact information and if he changes his mind about proceeding with TAVR then he will contact me.   The pt did have an incidental finding on TAVR CT that will require follow-up.  4 mm right middle lobe pulmonary nodule, similar to recent prior examination, but new compared to more remote prior study. This is favored to be benign, but repeat noncontrast chest CT is recommended in 12 months to ensure stability or regression. Will defer to Dr Harrington Challenger for additional follow-up.   Mild aneurysmal dilatation of the ascending thoracic aorta (4.5 cm in diameter). 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 Recommend semi-annual imaging followup by CTA or MRA and referral to cardiothoracic surgery if not already obtained. (This has been followed by Dr Roxan Hockey)

## 2022-03-17 ENCOUNTER — Telehealth: Payer: Self-pay | Admitting: Internal Medicine

## 2022-03-17 NOTE — Telephone Encounter (Signed)
Put him in as add on at end of July or in September in [place]

## 2022-03-17 NOTE — Telephone Encounter (Signed)
Earliest availability with Dr. Harrington Challenger is 11/17 in [place].

## 2022-03-18 NOTE — Telephone Encounter (Signed)
I called and left a message for the patient to make a follow up to get further refills.

## 2022-03-18 NOTE — Telephone Encounter (Signed)
Pt is scheduled on 09/22 with Dr. Harrington Challenger

## 2022-03-21 NOTE — Telephone Encounter (Signed)
Attempted to call pt but unable to reach. Left message for him to return call. °

## 2022-03-22 MED ORDER — IPRATROPIUM-ALBUTEROL 0.5-2.5 (3) MG/3ML IN SOLN: 3.0000 mL | RESPIRATORY_TRACT | 3 refills | Status: DC | PRN

## 2022-03-22 NOTE — Telephone Encounter (Signed)
Refill on Duonebs placed. Pt left detailed message nothing further needed at this time.

## 2022-03-30 ENCOUNTER — Encounter: Payer: Medicare Other | Admitting: Surgery

## 2022-04-04 ENCOUNTER — Telehealth: Payer: Self-pay | Admitting: Internal Medicine

## 2022-04-04 DIAGNOSIS — J432 Centrilobular emphysema: Secondary | ICD-10-CM

## 2022-04-04 MED ORDER — ALBUTEROL SULFATE HFA 108 (90 BASE) MCG/ACT IN AERS: 2.0000 | INHALATION_SPRAY | RESPIRATORY_TRACT | 0 refills | Status: DC | PRN

## 2022-04-04 NOTE — Telephone Encounter (Signed)
Called and spoke with patient. Patient verified pharmacy. Advised patient I would send in a refill for him. Patient has an upcoming visit with Dr. Shearon Stalls.   Nothing further needed.

## 2022-04-12 ENCOUNTER — Ambulatory Visit (INDEPENDENT_AMBULATORY_CARE_PROVIDER_SITE_OTHER): Payer: Medicare Other | Admitting: Internal Medicine

## 2022-04-12 ENCOUNTER — Encounter: Payer: Self-pay | Admitting: Internal Medicine

## 2022-04-12 VITALS — BP 124/80 | HR 71 | Ht 66.0 in | Wt 154.2 lb

## 2022-04-12 DIAGNOSIS — J449 Chronic obstructive pulmonary disease, unspecified: Secondary | ICD-10-CM

## 2022-04-12 DIAGNOSIS — J432 Centrilobular emphysema: Secondary | ICD-10-CM | POA: Diagnosis not present

## 2022-04-12 MED ORDER — ALBUTEROL SULFATE HFA 108 (90 BASE) MCG/ACT IN AERS: 2.0000 | INHALATION_SPRAY | RESPIRATORY_TRACT | 5 refills | Status: DC | PRN

## 2022-04-12 MED ORDER — BREZTRI AEROSPHERE 160-9-4.8 MCG/ACT IN AERO: 2.0000 | INHALATION_SPRAY | Freq: Two times a day (BID) | RESPIRATORY_TRACT | 11 refills | Status: DC

## 2022-04-12 NOTE — Progress Notes (Signed)
[name] [name]    [id number]    1946/01/10  Primary Care Physician:Avbuere, Christean Grief, MD Date of Appointment: 04/12/2022 Established Patient Visit    Chief complaint:   Chief Complaint  Patient presents with   Follow-up    Pt does have some SOB on exertion, but feels well otherwise    HPI: History of COPD.   History includes: AV dz (s/p AVR in 2007 ), CAD (s/p SVG to RCA)  dilated ascending aorta, COPD, hypertension, atherosclerosis, moderate LV dysfunction in the past  Interval Updates: First time seeing him in person since 2021. He did a video visit with me last year.  He says he is on home oxygen 3LNC with [facility].  He takes symbicort BID.  No hospitalizations or ED visits.  Has nebulizer machine and solutions at home.  Using albuterol more than 7-8 times/day. Taking duoneb 4 times/day. He is taking duonebs with combivent.     I have reviewed the patient's family social and past medical history and updated as appropriate.   Past Medical History:  Diagnosis Date   Aortic stenosis    s/p bioprosthetic valve replacement   Arthritis    Ascending aortic aneurysm (HCC)    CAD (coronary artery disease) of bypass graft    s/p SVG to dRCA   Cardiomyopathy 2006   EF 30%; has history of CHF   CHF (congestive heart failure) (HCC)    COPD (chronic obstructive pulmonary disease) (HCC)    FEV1/FVC -> 0.52 (69% of predicted)   Dyspnea    HX: COPD   GERD (gastroesophageal reflux disease)    Heart murmur    Hypertension    Lesion of buccal mucosa    right, with non-restoable abscessed teeth   Osteoarthritis    Tobacco abuse    Wears glasses     Past Surgical History:  Procedure Laterality Date   AORTIC VALVE REPLACEMENT     Bovine valve   AORTIC VALVE REPLACEMENT     bioprosthetic, 01/2006 - cannot find record of surgery (has scar)   COLONOSCOPY     COLONOSCOPY N/A 01/01/2017   Procedure: COLONOSCOPY;  Surgeon: Laurence Spates, MD;  Location: [facility];  Service: Endoscopy;  Laterality: N/A;   ESOPHAGOGASTRODUODENOSCOPY N/A 01/01/2017   Procedure: ESOPHAGOGASTRODUODENOSCOPY (EGD);  Surgeon: Laurence Spates, MD;  Location: [facility] ENDOSCOPY;  Service: Endoscopy;  Laterality: N/A;   MULTIPLE TOOTH EXTRACTIONS     RIGHT HEART CATH AND CORONARY ANGIOGRAPHY N/A 02/25/2022   Procedure: RIGHT HEART CATH AND CORONARY ANGIOGRAPHY;  Surgeon: Burnell Blanks, MD;  Location: [place] [facility];  Service: Cardiovascular;  Laterality: N/A;   TONSILLECTOMY     TOOTH EXTRACTION Bilateral 07/31/2020   Procedure: DENTAL RESTORATION/EXTRACTIONS;  Surgeon: Diona Browner, DDS;  Location: [facility];  Service: Oral Surgery;  Laterality: Bilateral;    Family History  Problem Relation Age of Onset   Cancer Mother        uterine cancer   Emphysema Mother    Heart failure Father    Coronary artery disease Father    Emphysema Father    Emphysema Sister    Cancer Brother        neck/throat cancer   Emphysema Brother     Social History   Occupational History   Occupation: Retired    Comment: Dealer   Occupation: Retired Pension scheme manager  Tobacco Use   Smoking status: Former    Packs/day: 3.00  Years: 59.00    Total pack years: 177.00    Types: Cigarettes    Start date: 66    Quit date: 2017    Years since quitting: 6.5   Smokeless tobacco: Never   Tobacco comments:    quit smoking cigarettes in 2015  Vaping Use   Vaping Use: Never used  Substance and Sexual Activity   Alcohol use: No    Comment: states he quit many years ago   Drug use: No    Comment: previous h/o cocaine use, denies since 2006   Sexual activity: Not Currently    Physical Exam: Blood pressure 124/80, pulse 71, height '5\' 6"'$  (1.676 m), weight 154 lb 3.2 oz (69.9 kg), SpO2 92 %.  Diminished, no wheezes or crackles RRR systolic murmur No edema  Data Reviewed: Imaging: CT Angio from June 2023 - severe emphysema, 25m pulmonary nodule in RML   PFTs:  Pulmonary  function testing sept 2021 demonstrates very severe airflow limitation with an FEV1 of 34% and positive BD response. severely reduced DLCO and lung volumes show hyperinflation consistent with emphysema.  Immunization status: Immunization History  Administered Date(s) Administered   Fluad Quad(high Dose 65+) 05/29/2019, 06/18/2020   Moderna Sars-Covid-2 Vaccination 11/28/2019, 12/26/2019   Pneumococcal Conjugate-13 12/05/2018   Zoster Recombinat (Shingrix) 02/09/2018, 12/05/2018    Assessment:  Severe centrilobular emphysema FEV1 less 35% with worsening symptoms 155-pack-year smoking history, quit 2013 Status post aortic valve replacement, now with recurrent aortic stenosis. Coronary artery disease status post CABG Solitary pulmonary nodule, need for lung cancer screening  Plan/Recommendations: Severe copd, not improving. Stop symbicort. Switch to breztri. Gargle after use.  Continue albuterol prn with nebs  Ambulatory desat study today for inogen.  Regarding Lung cancer screening and SPN, His next scan would be due in June 2024.   Return to Care: Return in about 3 months (around 07/13/2022).  NLenice Llamas MD Pulmonary and [facility]

## 2022-04-12 NOTE — Addendum Note (Signed)
Addended by: Priscille Kluver on: 04/12/2022 03:35 PM   Modules accepted: Orders

## 2022-04-12 NOTE — Patient Instructions (Addendum)
Please schedule follow up scheduled with myself in 3 months.  If my schedule is not open yet, we will contact you with a reminder closer to that time. Please call [phone] if you haven't heard from [place] a month before.   We will prescribe an inogen machine for you.   Stop symbicort and combivent.   Start taking Breztri inhaler 2 puffs twice a day. Gargle after use.  Continue albuterol inhaler with nebs.

## 2022-04-20 ENCOUNTER — Encounter: Payer: Medicare Other | Admitting: Surgery

## 2022-06-06 IMAGING — CT CT ANGIO CHEST
2 of 6 series · 13 of 36 positions shown · IV contrast (iopamidol)
Comparison: 11/29/2019

CLINICAL DATA: Follow-up PADMA

EXAM:
CT ANGIOGRAPHY CHEST WITH CONTRAST
TECHNIQUE: Multidetector CT imaging of the chest was performed using the
standard protocol during bolus administration of intravenous
contrast. Multiplanar CT image reconstructions and MIPs were
obtained to evaluate the vascular anatomy.
CONTRAST:  75mL KLHCHK-88P IOPAMIDOL (KLHCHK-88P) INJECTION 76%

[Series 4: cta thorax 2.00 bv36 s3 axial arterial · axial · arterial · 0.65mm/px · z∈[+1643,+1915]mm · 12 of 162 slices shown]
[im 13/162  lung]
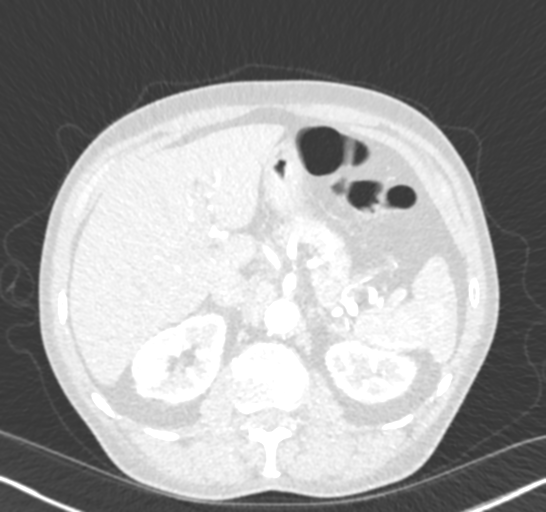
[im 25/162  mediastinal]
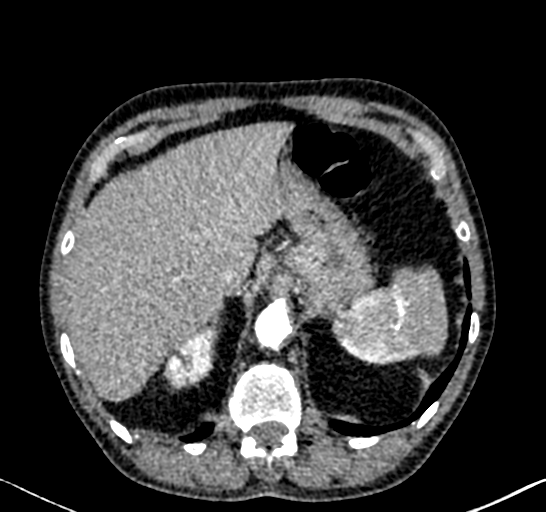
[im 38/162  lung]
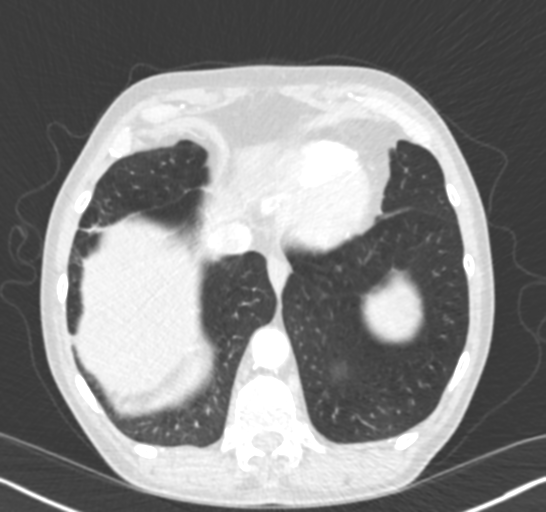
[im 50/162  mediastinal]
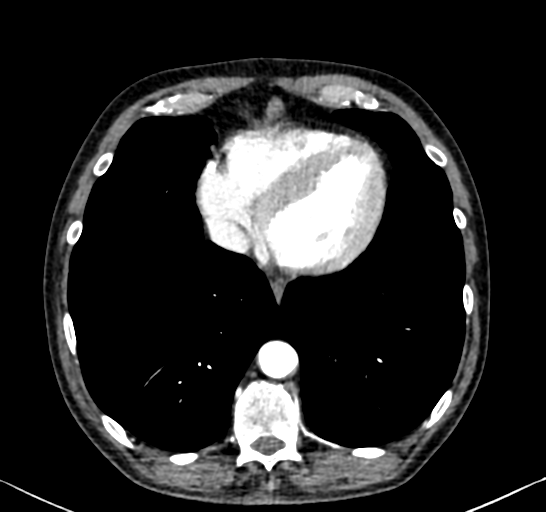
[im 62/162  lung]
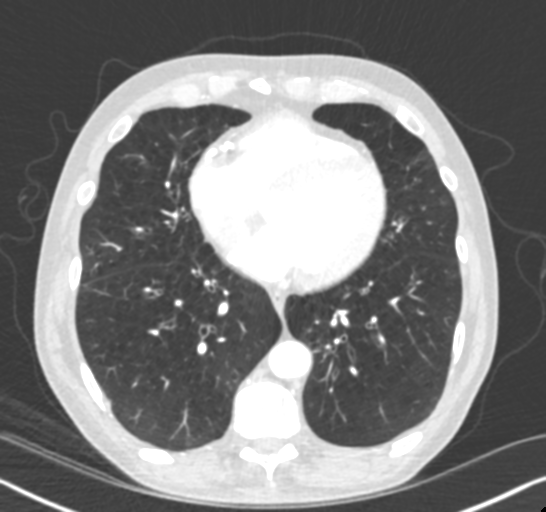
[im 75/162  mediastinal]
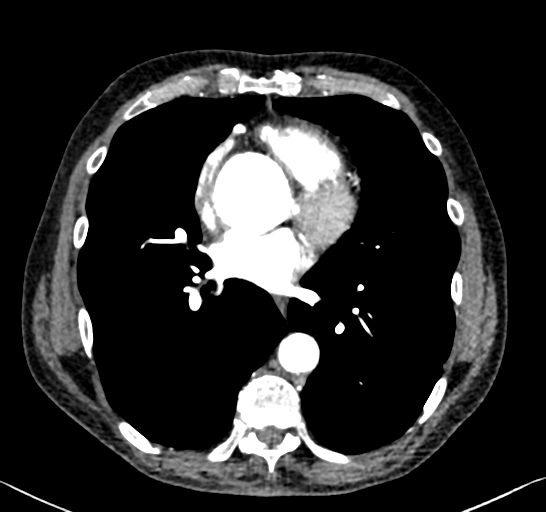
[im 87/162  lung]
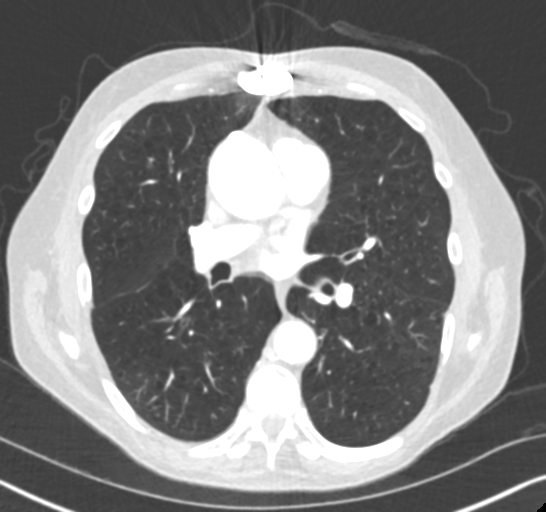
[im 100/162  mediastinal]
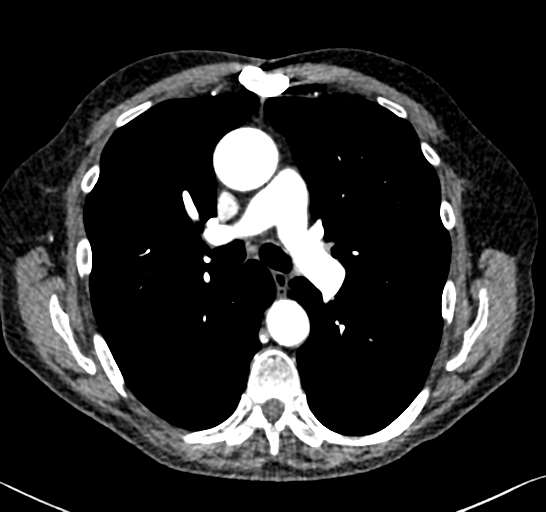
[im 112/162  lung]
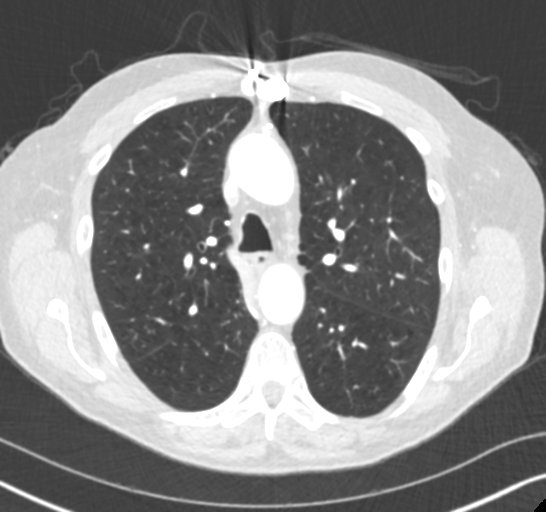
[im 124/162  mediastinal]
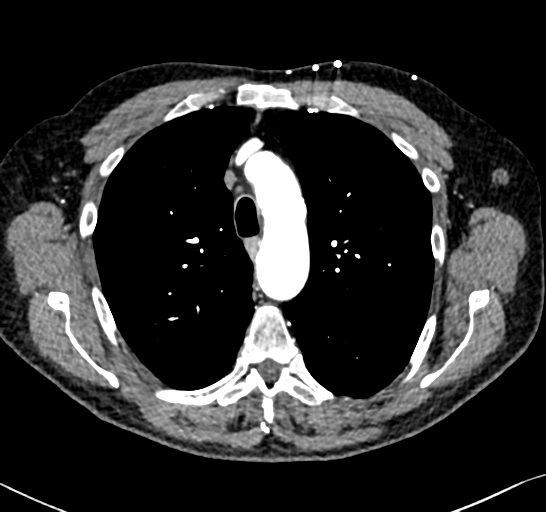
[im 137/162  lung]
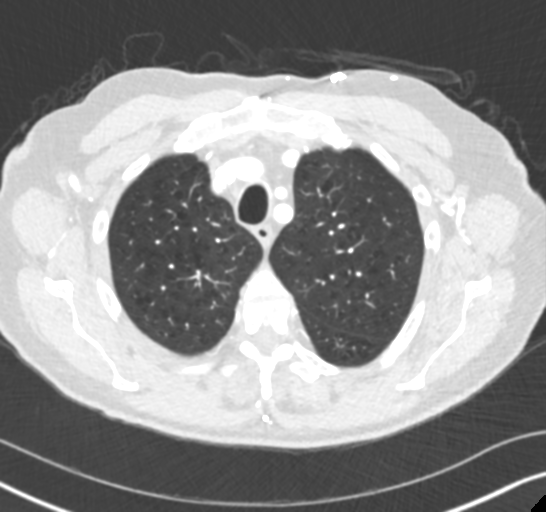
[im 149/162  mediastinal]
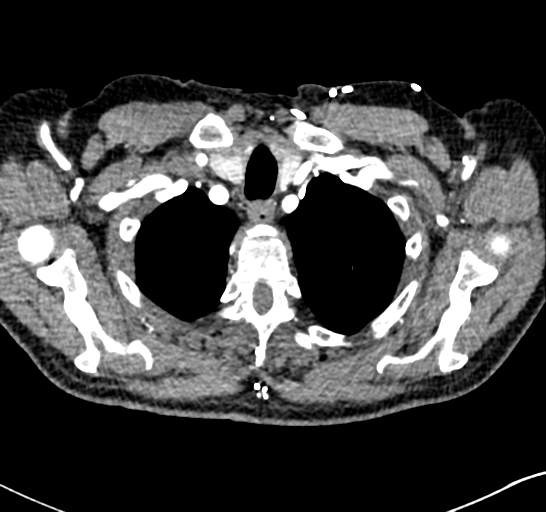

[Series 9: cta thorax 2.00 bv36 s3 cor st · coronal · 0.64mm/px · 1 of 155 slices shown]
[im 78/155  mediastinal]
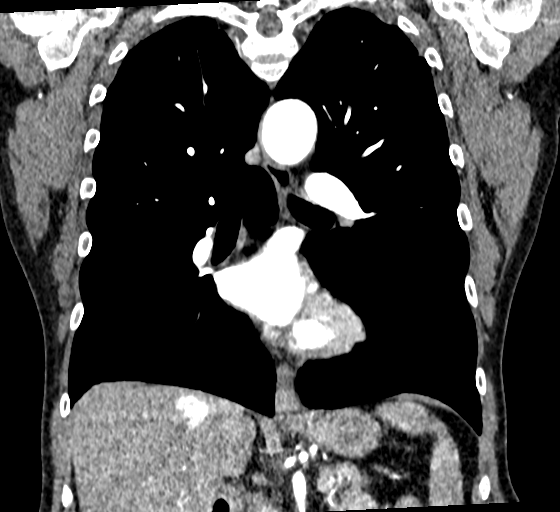

[13 of 36 positions shown; findings below may reference images not displayed]

FINDINGS: Cardiovascular: Preferential opacification of the thoracic aorta.
Unchanged enlargement of the tubular ascending thoracic aorta,
measuring approximately 5.1 x 4.4 cm. Unchanged enlargement of the
sinuses of Valsalva, measuring up to 5.1 cm. Aortic valve
prosthesis. The descending thoracic aorta is normal in caliber,
measuring up to 2.7 x 2.6 cm. Scattered atherosclerosis. Status post
CABG. Normal heart size. Left coronary artery calcifications. No
pericardial effusion.

Mediastinum/Nodes: No enlarged mediastinal, hilar, or axillary lymph
nodes. Thyroid gland, trachea, and esophagus demonstrate no
significant findings.

Lungs/Pleura: Moderate centrilobular emphysema. Diffuse bilateral
bronchial wall thickening. No pleural effusion or pneumothorax.

Upper Abdomen: No acute abnormality.

Musculoskeletal: No chest wall abnormality. No acute or significant
osseous findings.

Review of the MIP images confirms the above findings.
IMPRESSION: 1. Unchanged enlargement of the tubular ascending thoracic aorta,
measuring approximately 5.1 x 4.4 cm. Unchanged enlargement of the
sinuses of Valsalva, measuring up to 5.1 cm. Ascending thoracic
aortic aneurysm. Recommend semi-annual imaging followup by CTA or
MRA and referral to cardiothoracic surgery if not already obtained.
This recommendation follows 6010
ACCF/AHA/AATS/ACR/ASA/SCA/JUMPER/MUZO/ES/ZAW Guidelines for the
Diagnosis and Management of Patients With Thoracic Aortic Disease.
Circulation. 6010; 121: E266-e369. Aortic aneurysm NOS (ZKVQ2-2E0.0)
Aortic Atherosclerosis (ZKVQ2-UMF.F).
2. Aortic valve prosthesis.
3. Emphysema and diffuse bilateral bronchial wall thickening.
Emphysema (ZKVQ2-156.B).
4. Coronary artery disease.

## 2022-06-14 ENCOUNTER — Other Ambulatory Visit: Payer: Self-pay | Admitting: Thoracic Surgery (Cardiothoracic Vascular Surgery)

## 2022-06-14 DIAGNOSIS — I7121 Aneurysm of the ascending aorta, without rupture: Secondary | ICD-10-CM

## 2022-06-17 ENCOUNTER — Other Ambulatory Visit: Payer: Self-pay | Admitting: Internal Medicine

## 2022-06-18 LAB — COMPLETE METABOLIC PANEL WITH GFR
AG Ratio: 1.8 (calc) (ref 1.0–2.5)
ALT: 13 U/L (ref 9–46)
AST: 19 U/L (ref 10–35)
Albumin: 4.4 g/dL (ref 3.6–5.1)
Alkaline phosphatase (APISO): 31 U/L — ABNORMAL LOW (ref 35–144)
BUN: 14 mg/dL (ref 7–25)
CO2: 24 mmol/L (ref 20–32)
Calcium: 9 mg/dL (ref 8.6–10.3)
Chloride: 102 mmol/L (ref 98–110)
Creat: 0.98 mg/dL (ref 0.70–1.28)
Globulin: 2.5 g/dL (calc) (ref 1.9–3.7)
Glucose, Bld: 100 mg/dL — ABNORMAL HIGH (ref 65–99)
Potassium: 4.4 mmol/L (ref 3.5–5.3)
Sodium: 138 mmol/L (ref 135–146)
Total Bilirubin: 0.6 mg/dL (ref 0.2–1.2)
Total Protein: 6.9 g/dL (ref 6.1–8.1)
eGFR: 81 mL/min/{1.73_m2} (ref >=60)

## 2022-06-18 LAB — LIPID PANEL
Cholesterol: 271 mg/dL — ABNORMAL HIGH (ref <200)
HDL: 81 mg/dL (ref >=40)
LDL Cholesterol (Calc): 168 mg/dL (calc) — ABNORMAL HIGH
Non-HDL Cholesterol (Calc): 190 mg/dL (calc) — ABNORMAL HIGH (ref <130)
Total CHOL/HDL Ratio: 3.3 (calc) (ref <5.0)
Triglycerides: 100 mg/dL (ref <150)

## 2022-06-18 LAB — CBC
HCT: 43.1 % (ref 38.5–50.0)
Hemoglobin: 14.2 g/dL (ref 13.2–17.1)
MCH: 29 pg (ref 27.0–33.0)
MCHC: 32.9 g/dL (ref 32.0–36.0)
MCV: 88.1 fL (ref 80.0–100.0)
MPV: 12.8 fL — ABNORMAL HIGH (ref 7.5–12.5)
Platelets: 164 10*3/uL (ref 140–400)
RBC: 4.89 10*6/uL (ref 4.20–5.80)
RDW: 14.8 % (ref 11.0–15.0)
WBC: 5.3 10*3/uL (ref 3.8–10.8)

## 2022-06-18 LAB — TSH: TSH: 0.67 mIU/L (ref 0.40–4.50)

## 2022-06-21 NOTE — Progress Notes (Deleted)
Cardiology Office Note   Date:  06/21/2022   ID:  [name] [name], DOB May 26, 1946, MRN [id number]  PCP:  [name] Ebbs, MD  Cardiologist:   [name] Carnes, MD   F/u of AV dz       History of Present Illness: [name] [name] is a 76 y.o. male with a history of bicuspid AV and AS (s/p AVR in 2007  25 mm bovine prosthesis, 2007 ), CAD (s/p SVG to RCA 2007)  dilated ascending aorta, COPD, hypertension, atherosclerosis   The patient also has a history of moderate LV dysfunction in the past with normalization o LVEF   I last saw the pt as a televisit in June 2022    Since then he has been seen by [name] [name] for follow up of aneurysm  Aorta remains stable    The pt says he has developed worsening SOB     Takes activities as tolerated    Denies CP   No PND   Notes occasional dizziness but no syncope     I saw the pt in May 2023   He wsa seen by [name] [name]    No outpatient medications have been marked as taking for the 06/24/22 encounter (Appointment) with [name] Records, MD.     Allergies:   Atenolol   Past Medical History:  Diagnosis Date   Aortic stenosis    s/p bioprosthetic valve replacement   Arthritis    Ascending aortic aneurysm ([place])    CAD (coronary artery disease) of bypass graft    s/p SVG to dRCA   Cardiomyopathy 2006   EF 30%; has history of CHF   CHF (congestive heart failure) (HCC)    COPD (chronic obstructive pulmonary disease) (HCC)    FEV1/FVC -> 0.52 (69% of predicted)   Dyspnea    HX: COPD   GERD (gastroesophageal reflux disease)    Heart murmur    Hypertension    Lesion of buccal mucosa    right, with non-restoable abscessed teeth   Osteoarthritis    Tobacco abuse    Wears glasses     Past Surgical History:  Procedure Laterality Date   AORTIC VALVE REPLACEMENT     Bovine valve   AORTIC VALVE REPLACEMENT     bioprosthetic, 01/2006 - cannot find record of surgery (has scar)   COLONOSCOPY     COLONOSCOPY N/A 01/01/2017   Procedure: COLONOSCOPY;   Surgeon: [name] Spates, MD;  Location: [facility];  Service: Endoscopy;  Laterality: N/A;   ESOPHAGOGASTRODUODENOSCOPY N/A 01/01/2017   Procedure: ESOPHAGOGASTRODUODENOSCOPY (EGD);  Surgeon: [name] Spates, MD;  Location[facility] ENDOSCOPY;  Service: Endoscopy;  Laterality: N/A;   MULTIPLE TOOTH EXTRACTIONS     RIGHT HEART CATH AND CORONARY ANGIOGRAPHY N/A 02/25/2022   Procedure: RIGHT HEART CATH AND CORONARY ANGIOGRAPHY;  Surgeon: [name] Blanks, MD;  Location: [facility];  Service: Cardiovascular;  Laterality: N/A;   TONSILLECTOMY     TOOTH EXTRACTION Bilateral 07/31/2020   Procedure: DENTAL RESTORATION/EXTRACTIONS;  Surgeon: [name] [name], DDS;  Location: [place];  Service: Oral Surgery;  Laterality: Bilateral;     Social History:  The patient  reports that he quit smoking about 6 years ago. His smoking use included cigarettes. He started smoking about 65 years ago. He has a 177.00 pack-year smoking history. He has never used smokeless tobacco. He reports that he does not drink alcohol and does not use drugs.   Family History:  The patient's family history includes Cancer  in his brother and mother; Coronary artery disease in his father; Emphysema in his brother, father, mother, and sister; Heart failure in his father.    ROS:  Please see the history of present illness. All other systems are reviewed and  Negative to the above problem except as noted.    PHYSICAL EXAM: VS:  There were no vitals taken for this visit.  GEN: Elderly 76 yo male in no acute distress  HEENT: normal  Neck: JVP appears normal     Cardiac: Distant Heart sounds  RRR; Gr II/VI systolic murmur , rubs, or gallops  Tr LE edema  Respiratory: Decreased airflwo bilaterally   No rales   No wheezes GI: soft, nontender, nondistended, + BS  No hepatomegaly  MS: no deformity Moving all extremities   Skin: warm and dry, no rash Neuro:  Strength and sensation are intact Psych: euthymic mood, full affect   EKG:   EKG is ordered   SR 76 bpm   Frequent PVCs    ST T wave changes, consider ischemia   Lateral MI  Echo   02/08/22   1. Left ventricular ejection fraction, by estimation, is 20 to 25%. The  left ventricle has severely decreased function. The left ventricle  demonstrates global hypokinesis. The left ventricular internal cavity size  was moderately dilated. There is mild  left ventricular hypertrophy. Left ventricular diastolic parameters are  consistent with Grade I diastolic dysfunction (impaired relaxation).  Elevated left atrial pressure.   2. Right ventricular systolic function is moderately reduced. The right  ventricular size is mildly enlarged.   3. Left atrial size was moderately dilated.   4. Right atrial size was mildly dilated.   5. The mitral valve is normal in structure. Mild mitral valve  regurgitation. No evidence of mitral stenosis.   6. The aortic valve has been repaired/replaced. Aortic valve  regurgitation is trivial. Moderate aortic valve stenosis. There is a valve  present in the aortic position. Procedure Date: 01/18/06.   7. Aortic dilatation noted. There is moderate dilatation of the aortic  root, measuring 50 mm. There is moderate dilatation of the ascending  aorta, measuring 46 mm.   8. The inferior vena cava is normal in size with greater than 50%  respiratory variability, suggesting right atrial pressure of 3 mmHg.   Echo 03/11/22  1. The aortic valve has been repaired/replaced. Aortic valve regurgitation is not visualized. EOA 1.23 cm2, iEOA 0.69 cm2/m2, AT 105 msec, DVI 0.25. Findings consistent with prosthetic valve obstruction. There is a 25 mm bovine pericardial valve present in the aortic position. Procedure Date: 01/18/2006 (Dr. Roxan Hockey). Aortic valve mean gradient measures 23.0 mmHg. Aortic valve Vmax measures 3.14 m/s. 2. Aortic dilatation noted. There is severe aneurysmal dilatation of the aortic root, measuring 50 mm. There is moderate aneurysmal  dilatation of the ascending aorta, measuring 48 mm. These measurements are approximate given suboptimal image quality. Recommend gated CTA aorta for best assessment of aortic root, given relative change since last echo and CTA. 3. Left ventricular ejection fraction, by estimation, is 50%. The left ventricle has low normal function. The left ventricle has no regional wall motion abnormalities. There is mild left ventricular hypertrophy. Left ventricular diastolic parameters are consistent with Grade I diastolic dysfunction (impaired relaxation). 4. Right ventricular systolic function is mildly reduced. The right ventricular size is mildly enlarged. Tricuspid regurgitation signal is inadequate for assessing PA pressure. 5. Right atrial size was mildly dilated. 6. The mitral valve is normal in  structure. Trivial mitral valve regurgitation. No evidence of mitral stenosis. 7. The inferior vena cava is dilated in size with >50% respiratory variability, suggesting right atrial pressure of 8 mmHg.   CT    01/12/22 IMPRESSION: 1. Stable appearance of ascending thoracic aortic aneurysm measuring 4.5 cm. Ascending thoracic aortic aneurysm. Recommend semi-annual imaging followup by CTA or MRA and referral to cardiothoracic surgery if not already obtained. This recommendation follows 2010 ACCF/AHA/AATS/ACR/ASA/SCA/SCAI/SIR/STS/SVM Guidelines for the Diagnosis and Management of Patients With Thoracic Aortic Disease. Circulation. 2010; 121: S970-Y637. Aortic aneurysm NOS (ICD10-I71.9) 2. Stable postsurgical changes of CABG and aortic valve replacement. 3. There is a new 6 mm solid pulmonary nodule in the right middle lobe. Recommend a non-contrast Chest CT at 6-12 months, then another non-contrast Chest CT at 18-24 months. 4. 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   Lipid Panel    Component Value Date/Time   CHOL 271 (H) 06/17/2022 0000   CHOL 143  08/18/2017 1057   TRIG 100 06/17/2022 0000   HDL 81 06/17/2022 0000   HDL 69 08/18/2017 1057   CHOLHDL 3.3 06/17/2022 0000   VLDL 10 03/18/2016 1016   LDLCALC 168 (H) 06/17/2022 0000      Wt Readings from Last 3 Encounters:  04/12/22 154 lb 3.2 oz (69.9 kg)  02/25/22 147 lb 11.2 oz (67 kg)  02/15/22 152 lb 9.6 oz (69.2 kg)      ASSESSMENT AND PLAN:  1  AV   Pt with hx of bicuspid valve   He is s/p AVR (25 mm bovine pericardial valve in April 2007)    In June Pt had evid of severe narrowing of this prosthesis   With severe COPD continued to follow   Today, echo agaion  shows severe stenosis   Now, however, LVEF is down  Pt is more symptomatic     REveiwed with pt    Would recomm that he be evaluated by structural team for possible TAVR        Echo in Feb 2020 prosthesis is OK   Cntinue to follow periodically  2 aortic aneurysm.  Follows with [employer]  Last CT in APril 2023 Aorta stable at 45 mm    3 hypertension  SBP is fair   140s   Keep on same meds   4 CAD  Denies CP   Is SOB   Now LVEF is down   CHanges may be due to AV but pt will need evaluation to r/o contributing CAD     5 hyperlipidemia.  Last LDL 73  HDL 73    Keep on same meds     6  COPD   Severe   PFTs in 2021 FEV1 ws less than .9 L   Appt with structural team  Signed, [name] Carnes, MD  06/21/2022 7:02 PM    [facility] [place], [place], [place]  [address] Phone: [phone]; Fax: [phone]

## 2022-06-24 ENCOUNTER — Ambulatory Visit: Payer: Medicare Other | Admitting: Internal Medicine

## 2022-07-12 ENCOUNTER — Encounter: Payer: Self-pay | Admitting: Internal Medicine

## 2022-07-12 ENCOUNTER — Ambulatory Visit (INDEPENDENT_AMBULATORY_CARE_PROVIDER_SITE_OTHER): Payer: Medicare Other | Admitting: Internal Medicine

## 2022-07-12 VITALS — BP 130/80 | HR 60 | Ht 66.0 in | Wt 161.8 lb

## 2022-07-12 DIAGNOSIS — J4489 Other specified chronic obstructive pulmonary disease: Secondary | ICD-10-CM | POA: Diagnosis not present

## 2022-07-12 DIAGNOSIS — J432 Centrilobular emphysema: Secondary | ICD-10-CM

## 2022-07-12 DIAGNOSIS — J9611 Chronic respiratory failure with hypoxia: Secondary | ICD-10-CM

## 2022-07-12 DIAGNOSIS — J439 Emphysema, unspecified: Secondary | ICD-10-CM | POA: Diagnosis not present

## 2022-07-12 MED ORDER — ALBUTEROL SULFATE HFA 108 (90 BASE) MCG/ACT IN AERS: 2.0000 | INHALATION_SPRAY | RESPIRATORY_TRACT | 11 refills | Status: DC | PRN

## 2022-07-12 MED ORDER — ALBUTEROL SULFATE (2.5 MG/3ML) 0.083% IN NEBU: 2.5000 mg | INHALATION_SOLUTION | Freq: Four times a day (QID) | RESPIRATORY_TRACT | 12 refills | Status: DC | PRN

## 2022-07-12 MED ORDER — BREZTRI AEROSPHERE 160-9-4.8 MCG/ACT IN AERO: 2.0000 | INHALATION_SPRAY | Freq: Two times a day (BID) | RESPIRATORY_TRACT | 11 refills | Status: DC

## 2022-07-12 NOTE — Patient Instructions (Addendum)
Please schedule follow up scheduled with myself in 6 months.  If my schedule is not open yet, we will contact you with a reminder closer to that time. Please call [phone] if you haven't heard from [place] a month before.   I am referring you to pulmonary rehab. They will call you to discuss the schedule. Hopefully you can work out transportation.  Continue Breztri 2 puffs twice a day.  Continue albuterol prn with nebs

## 2022-07-12 NOTE — Progress Notes (Signed)
[name] [name]    [id number]    Jul 30, 1946  Primary Care Physician:Avbuere, Christean Grief, MD Date of Appointment: 07/12/2022 Established Patient Visit    Chief complaint:   Chief Complaint  Patient presents with   Follow-up    HPI: History of COPD.   History includes: AV dz (s/p AVR in 2007 ), CAD (s/p SVG to RCA)  dilated ascending aorta, COPD, hypertension, atherosclerosis, moderate LV dysfunction in the past. Over 150 pack years, quit 2015.  Interval Updates: Here for follow up after changing to Breztri 2 puffs twice a day. Feeling better on this over symbicort.   On home oxygen 3LNC with adapt.  No interval hospitalizations or ED visits.   Has nebulizer machine and solutions at home.  Still taking albuterol rescue inhaler 7-8 times/day.  Taking combivent 4-5 times/day In neb form.   Lives at home independently. There is a gym in his apartment with a treadmill and bicycle.   Biggest issue is breathlessness with prn saba/sama use.  I have reviewed the patient's family social and past medical history and updated as appropriate.   Past Medical History:  Diagnosis Date   Aortic stenosis    s/p bioprosthetic valve replacement   Arthritis    Ascending aortic aneurysm (HCC)    CAD (coronary artery disease) of bypass graft    s/p SVG to dRCA   Cardiomyopathy 2006   EF 30%; has history of CHF   CHF (congestive heart failure) (HCC)    COPD (chronic obstructive pulmonary disease) (HCC)    FEV1/FVC -> 0.52 (69% of predicted)   Dyspnea    HX: COPD   GERD (gastroesophageal reflux disease)    Heart murmur    Hypertension    Lesion of buccal mucosa    right, with non-restoable abscessed teeth   Osteoarthritis    Tobacco abuse    Wears glasses     Past Surgical History:  Procedure Laterality Date   AORTIC VALVE REPLACEMENT     Bovine valve   AORTIC VALVE REPLACEMENT     bioprosthetic, 01/2006 - cannot find record of surgery (has scar)   COLONOSCOPY      COLONOSCOPY N/A 01/01/2017   Procedure: COLONOSCOPY;  Surgeon: Laurence Spates, MD;  Location: [facility];  Service: Endoscopy;  Laterality: N/A;   ESOPHAGOGASTRODUODENOSCOPY N/A 01/01/2017   Procedure: ESOPHAGOGASTRODUODENOSCOPY (EGD);  Surgeon: Laurence Spates, MD;  Location: [facility] END[facility]COPY;  Service: Endoscopy;  Laterality: N/A;   MULTIPLE TOOTH EXTRACTIONS     RIGHT HEART CATH AND CORONARY ANGIOGRAPHY N/A 02/25/2022   Procedure: RIGHT HEART CATH AND CORONARY ANGIOGRAPHY;  Surgeon: Burnell Blanks, MD;  Location: [facility];  Service: Cardiovascular;  Laterality: N/A;   TONSILLECTOMY     TOOTH EXTRACTION Bilateral 07/31/2020   Procedure: DENTAL RESTORATION/EXTRACTIONS;  Surgeon: Diona Browner, DDS;  Location: [facility];  Service: Oral Surgery;  Laterality: Bilateral;    Family History  Problem Relation Age of Onset   Cancer Mother        uterine cancer   Emphysema Mother    Heart failure Father    Coronary artery disease Father    Emphysema Father    Emphysema Sister    Cancer Brother        neck/throat cancer   Emphysema Brother     Social History   Occupational History   Occupation: Retired    Comment: Dealer   Occupation: Retired Pension scheme manager  Tobacco Use  Smoking status: Former    Packs/day: 3.00    Years: 59.00    Total pack years: 177.00    Types: Cigarettes    Start date: 19    Quit date: 2017    Years since quitting: 6.7   Smokeless tobacco: Never   Tobacco comments:    quit smoking cigarettes in 2015  Vaping Use   Vaping Use: Never used  Substance and Sexual Activity   Alcohol use: No    Comment: states he quit many years ago   Drug use: No    Comment: previous h/o cocaine use, denies since 2006   Sexual activity: Not Currently    Physical Exam: Blood pressure 130/80, pulse 60, height '5\' 6"'$  (1.676 m), weight 161 lb 12.8 oz (73.4 kg), SpO2 93 %.  Gen: elderly, no distress Lungs:diminished, clear CV: RRR, systolic murmur  Data  Reviewed: Imaging: CT Angio from June 2023 - severe emphysema, 2m pulmonary nodule in RML   PFTs:  Pulmonary function testing sept 2021 demonstrates very severe airflow limitation with an FEV1 of 34% and positive BD response. severely reduced DLCO and lung volumes show hyperinflation consistent with emphysema.  Immunization status: Immunization History  Administered Date(s) Administered   Fluad Quad(high Dose 65+) 05/29/2019, 06/18/2020   Influenza, High Dose Seasonal PF 07/27/2018   Moderna Sars-Covid-2 Vaccination 11/28/2019, 12/26/2019   Pneumococcal Conjugate-13 12/05/2018   Zoster Recombinat (Shingrix) 02/09/2018, 12/05/2018    Assessment:  Severe centrilobular emphysema FEV1 less 35%, stable 155-pack-year smoking history, quit 2015, stable Status post aortic valve replacement, now with recurrent aortic stenosis. Coronary artery disease status post CABG Solitary pulmonary nodule, need for lung cancer screening  Plan/Recommendations: Continue Breztri 2 puffs twice a day.  Continue albuterol prn with nebs. Stop duoneb.  We discussed pulmonary rehab but transportation is an issue for him. Will place referral and see he can work something out with his sister for transportation. He might be able to based on days and times.   Regarding Lung cancer screening and SPN, His next scan would be due in June 2024.   Return to Care: Return in about 6 months (around 01/11/2023).  NLenice Llamas MD [facility]

## 2022-07-20 ENCOUNTER — Inpatient Hospital Stay: Admission: RE | Admit: 2022-07-20 | Payer: Medicare Other | Source: Ambulatory Visit

## 2022-07-20 ENCOUNTER — Ambulatory Visit
Admission: RE | Admit: 2022-07-20 | Discharge: 2022-07-20 | Disposition: A | Discharge status: Home / Self Care | Payer: Medicare Other | Source: Ambulatory Visit | Attending: Thoracic Surgery (Cardiothoracic Vascular Surgery) | Admitting: Thoracic Surgery (Cardiothoracic Vascular Surgery)

## 2022-07-20 DIAGNOSIS — I7121 Aneurysm of the ascending aorta, without rupture: Secondary | ICD-10-CM

## 2022-07-20 MED ORDER — IOPAMIDOL (ISOVUE-370) INJECTION 76%
75.0000 mL | Freq: Once | INTRAVENOUS | Status: AC | PRN
  Administered 2022-07-20: 75 mL via INTRAVENOUS

## 2022-07-21 ENCOUNTER — Encounter (HOSPITAL_COMMUNITY): Payer: Self-pay

## 2022-07-26 ENCOUNTER — Ambulatory Visit: Payer: Medicare Other | Admitting: Thoracic Surgery (Cardiothoracic Vascular Surgery)

## 2022-08-02 ENCOUNTER — Ambulatory Visit (INDEPENDENT_AMBULATORY_CARE_PROVIDER_SITE_OTHER): Payer: Medicare Other | Admitting: Thoracic Surgery (Cardiothoracic Vascular Surgery)

## 2022-08-02 VITALS — BP 162/91 | HR 67 | Resp 20 | Ht 66.0 in | Wt 164.0 lb

## 2022-08-02 DIAGNOSIS — I7121 Aneurysm of the ascending aorta, without rupture: Secondary | ICD-10-CM | POA: Diagnosis not present

## 2022-08-02 DIAGNOSIS — R911 Solitary pulmonary nodule: Secondary | ICD-10-CM

## 2022-08-02 NOTE — Progress Notes (Signed)
[address]       [place] [address]             [phone]      HPI: [name] returns for follow-up of his ascending aneurysm and middle lobe lung nodule.  [name] [name] is a 76 year old man with a history of heavy tobacco abuse (quit 2017), COPD, bicuspid aortic valve, aortic stenosis status post bioprosthetic AVR in 2007, valvular cardiomyopathy, congestive heart failure, hypertension, and ascending aortic aneurysm.  Bioprosthetic AVR in 2007.  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  In June 2022 echocardiogram showed prosthetic aortic valve stenosis.  Was evaluated and recommended to have TAVR but refused due to risk of complications.  He has had worsening of his exertional dyspnea.  Also has some dyspnea at rest.  Followed by Dr. Shearon Stalls.  He is on home oxygen 3 L nasal cannula.  Uses his albuterol 7-8 times per day.  Past Medical History:  Diagnosis Date   Aortic stenosis    s/p bioprosthetic valve replacement   Arthritis    Ascending aortic aneurysm (HCC)    CAD (coronary artery disease) of bypass graft    s/p SVG to dRCA   Cardiomyopathy 2006   EF 30%; has history of CHF   CHF (congestive heart failure) (HCC)    COPD (chronic obstructive pulmonary disease) (HCC)    FEV1/FVC -> 0.52 (69% of predicted)   Dyspnea    HX: COPD   GERD (gastroesophageal reflux disease)    Heart murmur    Hypertension    Lesion of buccal mucosa    right, with non-restoable abscessed teeth   Osteoarthritis    Tobacco abuse    Wears glasses     Current Outpatient Medications  Medication Sig Dispense Refill   acetaminophen (TYLENOL) 500 MG tablet Take 1,000 mg by mouth in the morning, at noon, in the evening, and at bedtime.     albuterol (PROVENTIL) (2.5 MG/3ML) 0.083% nebulizer solution Take 3 mLs (2.5 mg total) by nebulization every 6 (six) hours as needed for wheezing or shortness of breath. 75 mL 12   albuterol (VENTOLIN HFA) 108 (90 Base) MCG/ACT inhaler  Inhale 2 puffs into the lungs every 4 (four) hours as needed for wheezing or shortness of breath. 8.5 g 11   atorvastatin (LIPITOR) 40 MG tablet TAKE 1 TABLET(40 MG) BY MOUTH DAILY 90 tablet 3   BETA CAROTENE PO Take 1 tablet by mouth once a week.      Budeson-Glycopyrrol-Formoterol (BREZTRI AEROSPHERE) 160-9-4.8 MCG/ACT AERO Inhale 2 puffs into the lungs in the morning and at bedtime. 1 each 11   celecoxib (CELEBREX) 100 MG capsule Take 100 mg by mouth daily.     dextromethorphan-guaiFENesin (MUCINEX DM) 30-600 MG per 12 hr tablet Take 1 tablet by mouth 4 (four) times daily.     furosemide (LASIX) 40 MG tablet Take '80mg'$  - 2 tablets by mouth every other day alternating with '40mg'$  - 1 tablet by mouth 135 tablet 1   ivabradine (CORLANOR) 5 MG TABS tablet Take 2 tablets by mouth around 8:30 AM the morning of CT scan 2 tablet 0   lisinopril (PRINIVIL,ZESTRIL) 5 MG tablet Take 5 mg by mouth daily.     Multiple Vitamin (MULTIVITAMIN WITH MINERALS) TABS tablet Take 1 tablet by mouth every other day. Alive     potassium chloride (KLOR-CON M) 10 MEQ tablet Take 2 tablets by mouth on the days you take 2 Lasix  tablets and 1 tablet on the days you take 1 Lasix tablet 135 tablet 1   Simethicone 125 MG TABS Take 125 mg by mouth daily as needed (gas).     trolamine salicylate (ASPERCREME) 10 % cream Apply 1 application. topically as needed (Joint pain).     Vitamin D, Ergocalciferol, (DRISDOL) 1.25 MG (50000 UT) CAPS capsule Take 5,000 Units by mouth every Wednesday.     No current facility-administered medications for this visit.    Physical Exam BP (!) 162/91 (BP Location: Right Arm, Patient Position: Sitting)   Pulse 67   Resp 20   Ht '5\' 6"'$  (1.676 m)   Wt 164 lb (74.4 kg)   SpO2 92% Comment: RA  BMI 26.102 kg/m  76 year old man, appears older than stated age [place] Alert and oriented x3 with no focal neurologic deficits Lungs with diminished breath sounds bilaterally, no wheezing Cardiac  regular rate and rhythm with faint systolic murmur  Diagnostic Tests: CT ANGIOGRAPHY CHEST WITH CONTRAST   TECHNIQUE: Multidetector CT imaging of the chest was performed using the standard protocol during bolus administration of intravenous contrast. Multiplanar CT image reconstructions and MIPs were obtained to evaluate the vascular anatomy.   RADIATION DOSE REDUCTION: This exam was performed according to the departmental dose-optimization program which includes automated exposure control, adjustment of the mA and/or kV according to patient size and/or use of iterative reconstruction technique.   CONTRAST:  58m ISOVUE-370 IOPAMIDOL (ISOVUE-370) INJECTION 76%   COMPARISON:  March 03, 2022.  June 17, 2020.   FINDINGS: Cardiovascular: Grossly stable 4.5 cm ascending thoracic aortic aneurysm is noted. Status post aortic valve repair. Atherosclerosis of thoracic aorta is noted without dissection. Great vessels are widely patent without significant stenosis. Mild cardiomegaly is noted. No pericardial effusion is noted. Status post coronary bypass graft.   Mediastinum/Nodes: No enlarged mediastinal, hilar, or axillary lymph nodes. Thyroid gland, trachea, and esophagus demonstrate no significant findings.   Lungs/Pleura: No pneumothorax or pleural effusion is noted. Mild emphysematous disease is noted bilaterally. Grossly stable 5 mm nodule is noted in right middle lobe best seen on image number 97 of series 6 compared to prior exam of 2023, but not visualized on 2021 exam.   Upper Abdomen: No acute abnormality.   Musculoskeletal: No chest wall abnormality. No acute or significant osseous findings.   Review of the MIP images confirms the above findings.   IMPRESSION: Grossly stable 4.5 cm ascending thoracic aortic aneurysm. Recommend semi-annual imaging followup by CTA or MRA and referral to cardiothoracic surgery if not already obtained. This recommendation follows 2010  ACCF/AHA/AATS/ACR/ASA/SCA/SCAI/SIR/STS/SVM Guidelines for the Diagnosis and Management of Patients With Thoracic Aortic Disease. Circulation. 2010; 121:: J884-Z660 Aortic aneurysm NOS (ICD10-I71.9).   Status post coronary bypass graft and aortic valve repair.   Stable 5 mm nodule is noted in right middle lobe compared to prior exam of same year. Follow-up unenhanced chest CT in 12 months is recommended to ensure stability.   Aortic Atherosclerosis (ICD10-I70.0) and Emphysema (ICD10-J43.9).     Electronically Signed   By: JMarijo ConceptionM.D.   On: 07/20/2022 11:18 I personally reviewed the CT images.  No change in the 4.5 cm ascending aneurysm.  Right middle lobe nodule appears stable to slightly enlarged.  Impression: CLamoyne Hesselis a 76year old man with a history of heavy tobacco abuse (quit 2017), COPD, bicuspid aortic valve, aortic stenosis status post bioprosthetic AVR in 2007, valvular cardiomyopathy, congestive heart failure, hypertension, and ascending aortic aneurysm.  Ascending aortic  aneurysm--stable at 4.5 cm.  Recommendation of continued semiannual follow-up.  Would not be a candidate for surgical repair given present status.  Prosthetic valve aortic stenosis-hard to know to what degree this might be contributing to his dyspnea.  Refused TAVR due to risk of stroke.  Says "either my heart or my lungs are going to take me out, the heart will do it quicker."  Right middle lobe lung nodule-still relatively small.  Definitely larger than it was a year ago but at 5 mm would not recommend any intervention at this time.  Not a candidate for surgery.  We will plan to review that when we repeat a scan in 6 months.  COPD-significant dyspnea even at rest.  Minimal activities.  Followed by Dr. Shearon Stalls.  Hypertension-blood pressure elevated.  Says it is typically in the 120s at home.  Compliant with medications.  Plan: Return in 6 months with CT of chest  Melrose Nakayama,  MD [facility] [phone]

## 2022-08-28 ENCOUNTER — Other Ambulatory Visit: Payer: Self-pay | Admitting: Internal Medicine

## 2022-09-11 NOTE — Progress Notes (Deleted)
Cardiology Office Note   Date:  09/11/2022   ID:  [name], [name] 08/01/1946, MRN [id number]  PCP:  Nolene Ebbs, MD  Cardiologist:   Dorris Carnes, MD   F/u of AV dz       History of Present Illness: [name] [name] is a 76 y.o. male with a history of bicuspid AV and AS (s/p AVR in 2007  25 mm bovine prosthesis, 2007 ), CAD (s/p SVG to RCA 2007)  dilated ascending aorta, COPD, hypertension, atherosclerosis   The patient also has a history of moderate LV dysfunction in the past with normalization o LVEF   I last saw the pt as a televisit in June 2022    Since then he has been seen by [name] for follow up of aneurysm  Aorta remains stable    The pt says he has developed worsening SOB     Takes activities as tolerated    Denies CP   No PND   Notes occasional dizziness but no syncope     I saw the pt in early May 2023   He was seen by Estevan Ryder after    No outpatient medications have been marked as taking for the 09/13/22 encounter (Appointment) with Fay Records, MD.     Allergies:   Atenolol   Past Medical History:  Diagnosis Date   Aortic stenosis    s/p bioprosthetic valve replacement   Arthritis    Ascending aortic aneurysm ([facility])    CAD (coronary artery disease) of bypass graft    s/p SVG to dRCA   Cardiomyopathy 2006   EF 30%; has history of CHF   CHF (congestive heart failure) (HCC)    COPD (chronic obstructive pulmonary disease) (HCC)    FEV1/FVC -> 0.52 (69% of predicted)   Dyspnea    HX: COPD   GERD (gastroesophageal reflux disease)    Heart murmur    Hypertension    Lesion of buccal mucosa    right, with non-restoable abscessed teeth   Osteoarthritis    Tobacco abuse    Wears glasses     Past Surgical History:  Procedure Laterality Date   AORTIC VALVE REPLACEMENT     Bovine valve   AORTIC VALVE REPLACEMENT     bioprosthetic, 01/2006 - cannot find record of surgery (has scar)   COLONOSCOPY     COLONOSCOPY N/A 01/01/2017   Procedure:  COLONOSCOPY;  Surgeon: Laurence Spates, MD;  Location: [facility];  Service: Endoscopy;  Laterality: N/A;   ESOPHAGOGASTRODUODENOSCOPY N/A 01/01/2017   Procedure: ESOPHAGOGASTRODUODENOSCOPY (EGD);  Surgeon: Laurence Spates, MD;  Location[facility];  Service: Endoscopy;  Laterality: N/A;   MULTIPLE TOOTH EXTRACTIONS     RIGHT HEART CATH AND CORONARY ANGIOGRAPHY N/A 02/25/2022   Procedure: RIGHT HEART CATH AND CORONARY ANGIOGRAPHY;  Surgeon: Burnell Blanks, MD;  Location: [facility];  Service: Cardiovascular;  Laterality: N/A;   TONSILLECTOMY     TOOTH EXTRACTION Bilateral 07/31/2020   Procedure: DENTAL RESTORATION/EXTRACTIONS;  Surgeon: Diona Browner, DDS;  Location: [facility];  Service: Oral Surgery;  Laterality: Bilateral;     Social History:  The patient  reports that he quit smoking about 6 years ago. His smoking use included cigarettes. He started smoking about 65 years ago. He has a 177.00 pack-year smoking history. He has never used smokeless tobacco. He reports that he does not drink alcohol and does not use drugs.   Family History:  The patient's family history  includes Cancer in his brother and mother; Coronary artery disease in his father; Emphysema in his brother, father, mother, and sister; Heart failure in his father.    ROS:  Please see the history of present illness. All other systems are reviewed and  Negative to the above problem except as noted.    PHYSICAL EXAM: VS:  There were no vitals taken for this visit.  GEN: Elderly 76 yo male in no acute distress  HEENT: normal  Neck: JVP appears normal     Cardiac: Distant Heart sounds  RRR; Gr II/VI systolic murmur , rubs, or gallops  Tr LE edema  Respiratory: Decreased airflwo bilaterally   No rales   No wheezes GI: soft, nontender, nondistended, + BS  No hepatomegaly  MS: no deformity Moving all extremities   Skin: warm and dry, no rash Neuro:  Strength and sensation are intact Psych: euthymic mood, full  affect   EKG:  EKG is ordered   SR 76 bpm   Frequent PVCs    ST T wave changes, consider ischemia   Lateral MI  Echo   02/08/22   1. Left ventricular ejection fraction, by estimation, is 20 to 25%. The  left ventricle has severely decreased function. The left ventricle  demonstrates global hypokinesis. The left ventricular internal cavity size  was moderately dilated. There is mild  left ventricular hypertrophy. Left ventricular diastolic parameters are  consistent with Grade I diastolic dysfunction (impaired relaxation).  Elevated left atrial pressure.   2. Right ventricular systolic function is moderately reduced. The right  ventricular size is mildly enlarged.   3. Left atrial size was moderately dilated.   4. Right atrial size was mildly dilated.   5. The mitral valve is normal in structure. Mild mitral valve  regurgitation. No evidence of mitral stenosis.   6. The aortic valve has been repaired/replaced. Aortic valve  regurgitation is trivial. Moderate aortic valve stenosis. There is a valve  present in the aortic position. Procedure Date: 01/18/06.   7. Aortic dilatation noted. There is moderate dilatation of the aortic  root, measuring 50 mm. There is moderate dilatation of the ascending  aorta, measuring 46 mm.   8. The inferior vena cava is normal in size with greater than 50%  respiratory variability, suggesting right atrial pressure of 3 mmHg.   Echo 03/11/22  1. The aortic valve has been repaired/replaced. Aortic valve regurgitation is not visualized. EOA 1.23 cm2, iEOA 0.69 cm2/m2, AT 105 msec, DVI 0.25. Findings consistent with prosthetic valve obstruction. There is a 25 mm bovine pericardial valve present in the aortic position. Procedure Date: 01/18/2006 (Dr. Roxan Hockey). Aortic valve mean gradient measures 23.0 mmHg. Aortic valve Vmax measures 3.14 m/s. 2. Aortic dilatation noted. There is severe aneurysmal dilatation of the aortic root, measuring 50 mm. There is  moderate aneurysmal dilatation of the ascending aorta, measuring 48 mm. These measurements are approximate given suboptimal image quality. Recommend gated CTA aorta for best assessment of aortic root, given relative change since last echo and CTA. 3. Left ventricular ejection fraction, by estimation, is 50%. The left ventricle has low normal function. The left ventricle has no regional wall motion abnormalities. There is mild left ventricular hypertrophy. Left ventricular diastolic parameters are consistent with Grade I diastolic dysfunction (impaired relaxation). 4. Right ventricular systolic function is mildly reduced. The right ventricular size is mildly enlarged. Tricuspid regurgitation signal is inadequate for assessing PA pressure. 5. Right atrial size was mildly dilated. 6. The mitral valve is  normal in structure. Trivial mitral valve regurgitation. No evidence of mitral stenosis. 7. The inferior vena cava is dilated in size with >50% respiratory variability, suggesting right atrial pressure of 8 mmHg.   CT    01/12/22 IMPRESSION: 1. Stable appearance of ascending thoracic aortic aneurysm measuring 4.5 cm. Ascending thoracic aortic aneurysm. Recommend semi-annual imaging followup by CTA or MRA and referral to cardiothoracic surgery if not already obtained. This recommendation follows 2010 ACCF/AHA/AATS/ACR/ASA/SCA/SCAI/SIR/STS/SVM Guidelines for the Diagnosis and Management of Patients With Thoracic Aortic Disease. Circulation. 2010; 121: K800-L491. Aortic aneurysm NOS (ICD10-I71.9) 2. Stable postsurgical changes of CABG and aortic valve replacement. 3. There is a new 6 mm solid pulmonary nodule in the right middle lobe. Recommend a non-contrast Chest CT at 6-12 months, then another non-contrast Chest CT at 18-24 months. 4. 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   Lipid Panel    Component Value Date/Time   CHOL 271 (H) 06/17/2022  0000   CHOL 143 08/18/2017 1057   TRIG 100 06/17/2022 0000   HDL 81 06/17/2022 0000   HDL 69 08/18/2017 1057   CHOLHDL 3.3 06/17/2022 0000   VLDL 10 03/18/2016 1016   LDLCALC 168 (H) 06/17/2022 0000      Wt Readings from Last 3 Encounters:  08/02/22 164 lb (74.4 kg)  07/12/22 161 lb 12.8 oz (73.4 kg)  04/12/22 154 lb 3.2 oz (69.9 kg)      ASSESSMENT AND PLAN:  1  AV   Pt with hx of bicuspid valve   He is s/p AVR (25 mm bovine pericardial valve in April 2007)    In June Pt had evid of severe narrowing of this prosthesis   With severe COPD continued to follow   Today, echo agaion  shows severe stenosis   Now, however, LVEF is down  Pt is more symptomatic     REveiwed with pt    Would recomm that he be evaluated by structural team for possible TAVR        Echo in Feb 2020 prosthesis is OK   Cntinue to follow periodically  2 aortic aneurysm.  Follows with [employer]  Last CT in APril 2023 Aorta stable at 45 mm    3 hypertension  SBP is fair   140s   Keep on same meds   4 CAD  Denies CP   Is SOB   Now LVEF is down   CHanges may be due to AV but pt will need evaluation to r/o contributing CAD     5 hyperlipidemia.  Last LDL 73  HDL 73    Keep on same meds     6  COPD   Severe   PFTs in 2021 FEV1 ws less than .9 L   Appt with structural team  Signed, Dorris Carnes, MD  09/11/2022 10:11 PM    [facility] [address], [place], [place]  [address] Phone: [phone]; Fax: [phone]

## 2022-09-12 ENCOUNTER — Ambulatory Visit: Payer: Medicare Other | Admitting: Internal Medicine

## 2022-09-13 ENCOUNTER — Ambulatory Visit: Payer: Medicare Other | Admitting: Internal Medicine

## 2022-10-23 NOTE — Progress Notes (Signed)
Cardiology Office Note   Date:  10/26/2022   ID:  Ramell, Wacha 05-Apr-1946, MRN [id number]  PCP:  Nolene Ebbs, MD  Cardiologist:   Dorris Carnes, MD   F/u of AV dz       History of Present Illness: [name] [name] is a 77 y.o. male with a history of bicuspid AV and AS (s/p bioprosthetic AVR in 2007  25 mm bovine prosthesis, 2007 ), CAD (s/p SVG to RCA 2007)  dilated ascending aorta (followed by [name]), sever COPD, hypertension, atherosclerosis    Echo in 2022   LVEF 50%   Mean gradient 19 mm Hg   AVA 1 cm2  DVI 0.2  Consistent with low gradient severe AS Pt referred to structural service in May 2023   ' I saw the pt in clinic in May 2023  Echo last spring (2023) showed severe prosethetic valve AS  Referred to structural service      I w He was then seen by C McAlhany   Underwent echo  DVI 0.25;  LVEF 50%   Aorta 50 mm R/L heart cath:  mod LAD stentosis  Patent graft to RCA  PCWP 11 mm HG. CT done for TAVR      After work up pt decided not to proceed       Since then the pt has been seen by [name] [name]  The pt denies CP    Breathing is stable   He gets SOB with activity .   He denies dizziness   No syncope   No palpitatoins    Current Meds  Medication Sig   acetaminophen (TYLENOL) 500 MG tablet Take 1,000 mg by mouth in the morning, at noon, in the evening, and at bedtime.   albuterol (PROVENTIL) (2.5 MG/3ML) 0.083% nebulizer solution Take 3 mLs (2.5 mg total) by nebulization every 6 (six) hours as needed for wheezing or shortness of breath.   albuterol (VENTOLIN HFA) 108 (90 Base) MCG/ACT inhaler Inhale 2 puffs into the lungs every 4 (four) hours as needed for wheezing or shortness of breath.   amLODipine (NORVASC) 2.5 MG tablet Take 1 tablet (2.5 mg total) by mouth daily.   atorvastatin (LIPITOR) 40 MG tablet TAKE 1 TABLET(40 MG) BY MOUTH DAILY   BETA CAROTENE PO Take 1 tablet by mouth once a week.    Budeson-Glycopyrrol-Formoterol (BREZTRI AEROSPHERE)  160-9-4.8 MCG/ACT AERO Inhale 2 puffs into the lungs in the morning and at bedtime.   celecoxib (CELEBREX) 100 MG capsule Take 100 mg by mouth daily.   COMBIVENT RESPIMAT 20-100 MCG/ACT AERS respimat Inhale 1 puff into the lungs 2 (two) times daily as needed.   dextromethorphan-guaiFENesin (MUCINEX DM) 30-600 MG per 12 hr tablet Take 1 tablet by mouth 4 (four) times daily.   furosemide (LASIX) 40 MG tablet Take '80mg'$  - 2 tablets by mouth every other day alternating with '40mg'$  - 1 tablet by mouth   ivabradine (CORLANOR) 5 MG TABS tablet Take 2 tablets by mouth around 8:30 AM the morning of CT scan   lisinopril (PRINIVIL,ZESTRIL) 5 MG tablet Take 5 mg by mouth daily.   Multiple Vitamin (MULTIVITAMIN WITH MINERALS) TABS tablet Take 1 tablet by mouth every other day. Alive   potassium chloride (KLOR-CON M) 10 MEQ tablet TAKE 2 TABLETS BY MOUTH ON DAYS THAT YOU TAKE 2 LASIX TABLETS AND 1 TABLET ON DAYS THAT YOU TAKE 1 LASIX TABLET   Simethicone 125 MG TABS Take 125 mg by mouth  daily as needed (gas).   trolamine salicylate (ASPERCREME) 10 % cream Apply 1 application. topically as needed (Joint pain).   Vitamin D, Ergocalciferol, (DRISDOL) 1.25 MG (50000 UT) CAPS capsule Take 5,000 Units by mouth every Wednesday.     Allergies:   Atenolol   Past Medical History:  Diagnosis Date   Aortic stenosis    s/p bioprosthetic valve replacement   Arthritis    Ascending aortic aneurysm (HCC)    CAD (coronary artery disease) of bypass graft    s/p SVG to dRCA   Cardiomyopathy 2006   EF 30%; has history of CHF   CHF (congestive heart failure) (HCC)    COPD (chronic obstructive pulmonary disease) (HCC)    FEV1/FVC -> 0.52 (69% of predicted)   Dyspnea    HX: COPD   GERD (gastroesophageal reflux disease)    Heart murmur    Hypertension    Lesion of buccal mucosa    right, with non-restoable abscessed teeth   Osteoarthritis    Tobacco abuse    Wears glasses     Past Surgical History:  Procedure  Laterality Date   AORTIC VALVE REPLACEMENT     Bovine valve   AORTIC VALVE REPLACEMENT     bioprosthetic, 01/2006 - cannot find record of surgery (has scar)   COLONOSCOPY     COLONOSCOPY N/A 01/01/2017   Procedure: COLONOSCOPY;  Surgeon: Laurence Spates, MD;  Location: [place];  Service: Endoscopy;  Laterality: N/A;   ESOPHAGOGASTRODUODENOSCOPY N/A 01/01/2017   Procedure: ESOPHAGOGASTRODUODENOSCOPY (EGD);  Surgeon: Laurence Spates, MD;  Location: [facility];  Service: Endoscopy;  Laterality: N/A;   MULTIPLE TOOTH EXTRACTIONS     RIGHT HEART CATH AND CORONARY ANGIOGRAPHY N/A 02/25/2022   Procedure: RIGHT HEART CATH AND CORONARY ANGIOGRAPHY;  Surgeon: Burnell Blanks, MD;  Location: [place] [facility];  Service: Cardiovascular;  Laterality: N/A;   TONSILLECTOMY     TOOTH EXTRACTION Bilateral 07/31/2020   Procedure: DENTAL RESTORATION/EXTRACTIONS;  Surgeon: Diona Browner, DDS;  Location: [place];  Service: Oral Surgery;  Laterality: Bilateral;     Social History:  The patient  reports that he quit smoking about 7 years ago. His smoking use included cigarettes. He started smoking about 66 years ago. He has a 177.00 pack-year smoking history. He has never used smokeless tobacco. He reports that he does not drink alcohol and does not use drugs.   Family History:  The patient's family history includes Cancer in his brother and mother; Coronary artery disease in his father; Emphysema in his brother, father, mother, and sister; Heart failure in his father.    ROS:  Please see the history of present illness. All other systems are reviewed and  Negative to the above problem except as noted.    PHYSICAL EXAM: VS:  BP (!) 152/90   Pulse 99   Ht '5\' 6"'$  (1.676 m)   Wt 158 lb 6.4 oz (71.8 kg)   SpO2 92%   BMI 25.57 kg/m   GEN: Elderly 77 yo male in NAD HEENT: normal  Neck: JVP is not elevated     Cardiac: Distant Heart sounds  RRR; Gr II/VI systolic murmur ;later peaking at base  No LE edema   Respiratory: Decreased airflow bilaterally    No wheezes GI: soft, nontender, nondistended, + BS  No hepatomegaly MS: no deformity Moving all extremities   Skin: warm and dry, no rash Neuro:  Strength and sensation are intact Psych: euthymic mood, full affect   EKG:  EKG is not  ordered   R/H heart cath May 2023      Prox RCA to Mid RCA lesion is 100% stenosed.   Ost RCA to Prox RCA lesion is 70% stenosed.   Mid LAD lesion is 50% stenosed.   SVG graft was visualized by angiography and is normal in caliber.   The graft exhibits no disease.   Moderate mid LAD stenosis. This does not appear to be flow limiting No significant disease in the Circumflex artery The RCA is a large dominant vessel with diffuse, heavily calcified proximal stenosis. The mid vessel a chronic total occlusion. The distal vessel and branches fill from the patent vein graft.  PCWP 11 mmHg Failure of the bioprosthetic AVR with severe aortic stenosis by echo. I did not cross the aortic valve today.   Recommendations: He has advanced COPD. He is not a candidate for redo AVR. He may be a candidate for TAVR. Will continue with workup for TAVR.   Echo 03/11/22  1. The aortic valve has been repaired/replaced. Aortic valve regurgitation is not visualized. EOA 1.23 cm2, iEOA 0.69 cm2/m2, AT 105 msec, DVI 0.25. Findings consistent with prosthetic valve obstruction. There is a 25 mm bovine pericardial valve present in the aortic position. Procedure Date: 01/18/2006 (Dr. Roxan Hockey). Aortic valve mean gradient measures 23.0 mmHg. Aortic valve Vmax measures 3.14 m/s. 2. Aortic dilatation noted. There is severe aneurysmal dilatation of the aortic root, measuring 50 mm. There is moderate aneurysmal dilatation of the ascending aorta, measuring 48 mm. These measurements are approximate given suboptimal image quality. Recommend gated CTA aorta for best assessment of aortic root, given relative change since last echo and CTA. 3.  Left ventricular ejection fraction, by estimation, is 50%. The left ventricle has low normal function. The left ventricle has no regional wall motion abnormalities. There is mild left ventricular hypertrophy. Left ventricular diastolic parameters are consistent with Grade I diastolic dysfunction (impaired relaxation). 4. Right ventricular systolic function is mildly reduced. The right ventricular size is mildly enlarged. Tricuspid regurgitation signal is inadequate for assessing PA pressure. 5. Right atrial size was mildly dilated. 6. The mitral valve is normal in structure. Trivial mitral valve regurgitation. No evidence of mitral stenosis. 7. The inferior vena cava is dilated in size with >50% respiratory variability, suggesting right atrial pressure of 8 mmHg.   CT    01/12/22 IMPRESSION: 1. Stable appearance of ascending thoracic aortic aneurysm measuring 4.5 cm. Ascending thoracic aortic aneurysm. Recommend semi-annual imaging followup by CTA or MRA and referral to cardiothoracic surgery if not already obtained. This recommendation follows 2010 ACCF/AHA/AATS/ACR/ASA/SCA/SCAI/SIR/STS/SVM Guidelines for the Diagnosis and Management of Patients With Thoracic Aortic Disease. Circulation. 2010; 121: D974-B638. Aortic aneurysm NOS (ICD10-I71.9) 2. Stable postsurgical changes of CABG and aortic valve replacement. 3. There is a new 6 mm solid pulmonary nodule in the right middle lobe. Recommend a non-contrast Chest CT at 6-12 months, then another non-contrast Chest CT at 18-24 months. 4. 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   Lipid Panel    Component Value Date/Time   CHOL 267 (H) 10/24/2022 1157   TRIG 163 (H) 10/24/2022 1157   HDL 78 10/24/2022 1157   CHOLHDL 3.4 10/24/2022 1157   CHOLHDL 3.3 06/17/2022 0000   VLDL 10 03/18/2016 1016   LDLCALC 160 (H) 10/24/2022 1157   LDLCALC 168 (H) 06/17/2022 0000      Wt Readings from Last 3  Encounters:  10/24/22 158 lb 6.4 oz (71.8  kg)  08/02/22 164 lb (74.4 kg)  07/12/22 161 lb 12.8 oz (73.4 kg)      ASSESSMENT AND PLAN:  1  AV   Pt with hx of bicuspid valve   He is s/p AVR (25 mm bovine pericardial valve in April 2007)    Now with severe stensosis of the prosthesis.  Underwnet TAVR evaluation but then declinied surgery. Plan to follow closely    Overall volume status OK  He denies dizziness, no PND Take activity as tolierate  2  Asc aortic aneurysim   Followes with [name]   measures 51 on CT scan   3 hypertension BP is elevated   Add low dose amlodioine 2.5   Follow    4 CAD   Cath in May 2023 showed no flow limiting lesions     5 hyperlipidemia.   Continue statin  Get labs today     6  COPD   Severe   PFTs in 2021 FEV1 ws less than .9 L    Check:  CBC, BMET, lipids    Signed, Dorris Carnes, MD  10/26/2022 8:34 AM    [address], [place], [place]  [address] Phone: [phone]; Fax: [phone]

## 2022-10-24 ENCOUNTER — Encounter: Payer: Self-pay | Admitting: Internal Medicine

## 2022-10-24 ENCOUNTER — Ambulatory Visit: Payer: 59 | Attending: Internal Medicine | Admitting: Internal Medicine

## 2022-10-24 VITALS — BP 152/90 | HR 99 | Ht 66.0 in | Wt 158.4 lb

## 2022-10-24 DIAGNOSIS — I10 Essential (primary) hypertension: Secondary | ICD-10-CM

## 2022-10-24 DIAGNOSIS — Z79899 Other long term (current) drug therapy: Secondary | ICD-10-CM

## 2022-10-24 MED ORDER — AMLODIPINE BESYLATE 2.5 MG PO TABS: 2.5000 mg | ORAL_TABLET | Freq: Every day | ORAL | 3 refills | Status: DC

## 2022-10-24 NOTE — Patient Instructions (Signed)
Medication Instructions:  Amlodipine 2.5 mg daily  *If you need a refill on your cardiac medications before your next appointment, please call your pharmacy*   Lab Work: Bmet, cbc, lipid  If you have labs (blood work) drawn today and your tests are completely normal, you will receive your results only by: [place] (if you have MyChart) OR A paper copy in the mail If you have any lab test that is abnormal or we need to change your treatment, we will call you to review the results.   Testing/Procedures:    Follow-Up: At [facility], you and your health needs are our priority.  As part of our continuing mission to provide you with exceptional heart care, we have created designated Provider Care Teams.  These Care Teams include your primary Cardiologist (physician) and Advanced Practice Providers (APPs -  Physician Assistants and Nurse Practitioners) who all work together to provide you with the care you need, when you need it.  We recommend signing up for the patient portal called "MyChart".  Sign up information is provided on this After Visit Summary.  MyChart is used to connect with patients for Virtual Visits (Telemedicine).  Patients are able to view lab/test results, encounter notes, upcoming appointments, etc.  Non-urgent messages can be sent to your provider as well.   To learn more about what you can do with MyChart, go to [email or web address].    Your next appointment:   5 week(s)  Provider:   NURSE VISIT       Other Instructions

## 2022-10-25 LAB — CBC
Hematocrit: 46.1 % (ref 37.5–51.0)
Hemoglobin: 15 g/dL (ref 13.0–17.7)
MCH: 30.3 pg (ref 26.6–33.0)
MCHC: 32.5 g/dL (ref 31.5–35.7)
MCV: 93 fL (ref 79–97)
Platelets: 162 10*3/uL (ref 150–450)
RBC: 4.95 x10E6/uL (ref 4.14–5.80)
RDW: 14.4 % (ref 11.6–15.4)
WBC: 7.2 10*3/uL (ref 3.4–10.8)

## 2022-10-25 LAB — BASIC METABOLIC PANEL
BUN/Creatinine Ratio: 14 (ref 10–24)
BUN: 13 mg/dL (ref 8–27)
CO2: 25 mmol/L (ref 20–29)
Calcium: 9.7 mg/dL (ref 8.6–10.2)
Chloride: 101 mmol/L (ref 96–106)
Creatinine, Ser: 0.92 mg/dL (ref 0.76–1.27)
Glucose: 92 mg/dL (ref 70–99)
Potassium: 4.4 mmol/L (ref 3.5–5.2)
Sodium: 142 mmol/L (ref 134–144)
eGFR: 87 mL/min/{1.73_m2} (ref >59)

## 2022-10-25 LAB — LIPID PANEL
Chol/HDL Ratio: 3.4 ratio (ref 0.0–5.0)
Cholesterol, Total: 267 mg/dL — ABNORMAL HIGH (ref 100–199)
HDL: 78 mg/dL (ref >39)
LDL Chol Calc (NIH): 160 mg/dL — ABNORMAL HIGH (ref 0–99)
Triglycerides: 163 mg/dL — ABNORMAL HIGH (ref 0–149)
VLDL Cholesterol Cal: 29 mg/dL (ref 5–40)

## 2022-10-27 ENCOUNTER — Telehealth: Payer: Self-pay

## 2022-10-27 DIAGNOSIS — E785 Hyperlipidemia, unspecified: Secondary | ICD-10-CM

## 2022-10-27 DIAGNOSIS — Z79899 Other long term (current) drug therapy: Secondary | ICD-10-CM

## 2022-10-27 NOTE — Telephone Encounter (Signed)
Pt advised his lab results and he reports that he is not taking any chol meds.. he says he tried statins in the past and he is not sure what his symptoms were but he knows he could not tolerate them.    On med list history... back in 04/28/22 pt was on Atorvastatin '40mg'$ .. and d/c'd not noted why.

## 2022-10-27 NOTE — Telephone Encounter (Signed)
I would recomm trial of Nexzilet if his insurance will cover it   Follow up lipids in 8 wks with liver panel Not sure where he lives  Get labs where closest to home

## 2022-10-27 NOTE — Telephone Encounter (Signed)
-----  Message from Dorris Carnes V, MD sent at 10/25/2022  9:10 PM EST ----- CBC is normal  Electrolytes and kidney function are normal LIpids are high     Is he taking statin

## 2022-10-31 NOTE — Telephone Encounter (Signed)
Pt advised and agrees to take it and I will call him after I forward to your Pharm D for assistance with coverage if needed.

## 2022-11-28 ENCOUNTER — Ambulatory Visit: Payer: 59 | Attending: Cardiology

## 2022-11-28 DIAGNOSIS — Z79899 Other long term (current) drug therapy: Secondary | ICD-10-CM

## 2022-11-28 NOTE — Progress Notes (Signed)
   Nurse Visit   Date of Encounter: 11/28/2022 ID: HERMILO KYLE, DOB 10-Jul-1946, MRN [id number]  PCP:  Nolene Ebbs, MD   [place] Providers Cardiologist:  Dorris Carnes, MD      Visit Details   VS:  BP 90/66 (BP Location: Left Arm, Patient Position: Sitting)   Pulse 81   Ht 5' 6"$  (1.676 m)   Wt 159 lb 3.2 oz (72.2 kg)   SpO2 93%   BMI 25.70 kg/m  , BMI Body mass index is 25.7 kg/m.  Wt Readings from Last 3 Encounters:  11/28/22 159 lb 3.2 oz (72.2 kg)  10/24/22 158 lb 6.4 oz (71.8 kg)  08/02/22 164 lb (74.4 kg)     Reason for visit: Blood pressure check Performed today: Vitals, Provider consulted:Dr Camnitiz, and Education Changes (medications, testing, etc.) : none Length of Visit: 30 minutes  Patient reports doing well with new medications.   Reviewed with Dr Curt Bears ( DOD) and no new orders received.   Signed, Tor Netters, RN  11/28/2022 11:55 AM

## 2022-11-28 NOTE — Patient Instructions (Signed)
Medication Instructions:  Your physician recommends that you continue on your current medications as directed. Please refer to the Current Medication list given to you today.  *If you need a refill on your cardiac medications before your next appointment, please call your pharmacy*   Follow-Up: At [facility], you and your health needs are our priority.  As part of our continuing mission to provide you with exceptional heart care, we have created designated Provider Care Teams.  These Care Teams include your primary Cardiologist (physician) and Advanced Practice Providers (APPs -  Physician Assistants and Nurse Practitioners) who all work together to provide you with the care you need, when you need it.  We recommend signing up for the patient portal called "MyChart".  Sign up information is provided on this After Visit Summary.  MyChart is used to connect with patients for Virtual Visits (Telemedicine).  Patients are able to view lab/test results, encounter notes, upcoming appointments, etc.  Non-urgent messages can be sent to your provider as well.   To learn more about what you can do with MyChart, go to [email or web address].    Your next appointment:   As scheduled  Provider:   Dorris Carnes, MD

## 2022-12-08 ENCOUNTER — Telehealth: Payer: Self-pay | Admitting: Internal Medicine

## 2022-12-08 DIAGNOSIS — J439 Emphysema, unspecified: Secondary | ICD-10-CM

## 2022-12-08 DIAGNOSIS — J432 Centrilobular emphysema: Secondary | ICD-10-CM

## 2022-12-08 NOTE — Telephone Encounter (Signed)
Patient states needs refill for Albuterol rescue inhaler. Pharmacy is [facility] [address] Patient phone number is [phone].

## 2022-12-09 MED ORDER — ALBUTEROL SULFATE HFA 108 (90 BASE) MCG/ACT IN AERS: 2.0000 | INHALATION_SPRAY | RESPIRATORY_TRACT | 11 refills | Status: DC | PRN

## 2022-12-09 NOTE — Telephone Encounter (Signed)
Called patient and confirmed refills of Albuterol. Confirmed pharmacy with patient over the phone. Nothing further needed

## 2022-12-12 ENCOUNTER — Other Ambulatory Visit: Payer: Self-pay | Admitting: Thoracic Surgery (Cardiothoracic Vascular Surgery)

## 2022-12-12 DIAGNOSIS — I7121 Aneurysm of the ascending aorta, without rupture: Secondary | ICD-10-CM

## 2022-12-12 DIAGNOSIS — R911 Solitary pulmonary nodule: Secondary | ICD-10-CM

## 2022-12-13 ENCOUNTER — Other Ambulatory Visit: Payer: Self-pay | Admitting: Internal Medicine

## 2022-12-22 ENCOUNTER — Ambulatory Visit (INDEPENDENT_AMBULATORY_CARE_PROVIDER_SITE_OTHER): Payer: 59 | Admitting: Internal Medicine

## 2022-12-22 ENCOUNTER — Encounter: Payer: Self-pay | Admitting: Internal Medicine

## 2022-12-22 VITALS — BP 112/80 | HR 75 | Temp 97.6°F | Ht 66.0 in | Wt 163.0 lb

## 2022-12-22 DIAGNOSIS — J4489 Other specified chronic obstructive pulmonary disease: Secondary | ICD-10-CM

## 2022-12-22 DIAGNOSIS — J439 Emphysema, unspecified: Secondary | ICD-10-CM

## 2022-12-22 DIAGNOSIS — J9611 Chronic respiratory failure with hypoxia: Secondary | ICD-10-CM

## 2022-12-22 DIAGNOSIS — J301 Allergic rhinitis due to pollen: Secondary | ICD-10-CM | POA: Diagnosis not present

## 2022-12-22 MED ORDER — MONTELUKAST SODIUM 10 MG PO TABS: 10.0000 mg | ORAL_TABLET | Freq: Every day | ORAL | 11 refills | Status: DC

## 2022-12-22 MED ORDER — FLUTICASONE PROPIONATE 50 MCG/ACT NA SUSP: 1.0000 | Freq: Every day | NASAL | 5 refills | Status: AC

## 2022-12-22 NOTE — Patient Instructions (Addendum)
Please schedule follow up scheduled with myself in 6 months.  If my schedule is not open yet, we will contact you with a reminder closer to that time. Please call [phone] if you haven't heard from [place] a month before.   Continue breztri 2 puffs twice a day and albuterol as needed.   Start montelukast (singulair) allergy pill to see if this helps your allergies. Continue cetirizine. Add flonase nasal spray.  Flonase - 1 spray on each side of your nose twice a day for first week, then 1 spray on each side.   Instructions for use: If you also use a saline nasal spray or rinse, use that first. Position the head with the chin slightly tucked. Use the right hand to spray into the left nostril and the right hand to spray into the left nostril.   Point the bottle away from the septum of your nose (cartilage that divides the two sides of your nose).  Hold the nostril closed on the opposite side from where you will spray Spray once and gently sniff to pull the medicine into the higher parts of your nose.  Don't sniff too hard as the medicine will drain down the back of your throat instead. Repeat with a second spray on the same side if prescribed. Repeat on the other side of your nose.

## 2022-12-22 NOTE — Progress Notes (Signed)
[name] [name]    [id number]    [birth year]  Primary Care Physician:Avbuere, Christean Grief, MD Date of Appointment: 12/22/2022 Established Patient Visit    Chief complaint:   Chief Complaint  Patient presents with   Follow-up    Pt states he is feeling the same,     HPI: History of COPD.   History includes: AV dz (s/p AVR in 2007 ), CAD (s/p SVG to RCA)  dilated ascending aorta, COPD, hypertension, atherosclerosis, moderate LV dysfunction in the past. Over 150 pack years, quit 2015.  Interval Updates: Here for follow up.  Judithann Sauger continues to be helpful. Using 2 puffs twice a day, sometimes a little extra.   On home oxygen 3L at rest, sometimes a little more with exertion. Doesn't leave his house much.   No interval hospitalizations/ED visits.   Using albuterol inhaler 10 times a day on days he goes out, some days he uses it not much at all. Using cetirizine for alleriges but feels it isn't helping as much. Allegra D did help but he was told to stop taking it.   I have reviewed the patient's family social and past medical history and updated as appropriate.   Past Medical History:  Diagnosis Date   Aortic stenosis    s/p bioprosthetic valve replacement   Arthritis    Ascending aortic aneurysm (HCC)    CAD (coronary artery disease) of bypass graft    s/p SVG to dRCA   Cardiomyopathy 2006   EF 30%; has history of CHF   CHF (congestive heart failure) (HCC)    COPD (chronic obstructive pulmonary disease) (HCC)    FEV1/FVC -> 0.52 (69% of predicted)   Dyspnea    HX: COPD   GERD (gastroesophageal reflux disease)    Heart murmur    Hypertension    Lesion of buccal mucosa    right, with non-restoable abscessed teeth   Osteoarthritis    Tobacco abuse    Wears glasses     Past Surgical History:  Procedure Laterality Date   AORTIC VALVE REPLACEMENT     Bovine valve   AORTIC VALVE REPLACEMENT     bioprosthetic, 01/2006 - cannot find record of surgery (has scar)    COLONOSCOPY     COLONOSCOPY N/A 01/01/2017   Procedure: COLONOSCOPY;  Surgeon: Laurence Spates, MD;  Location: [place];  Service: Endoscopy;  Laterality: N/A;   ESOPHAGOGASTRODUODENOSCOPY N/A 01/01/2017   Procedure: ESOPHAGOGASTRODUODENOSCOPY (EGD);  Surgeon: Laurence Spates, MD;  Location: [facility];  Service: Endoscopy;  Laterality: N/A;   MULTIPLE TOOTH EXTRACTIONS     RIGHT HEART CATH AND CORONARY ANGIOGRAPHY N/A 02/25/2022   Procedure: RIGHT HEART CATH AND CORONARY ANGIOGRAPHY;  Surgeon: Burnell Blanks, MD;  Location: [place] [facility];  Service: Cardiovascular;  Laterality: N/A;   TONSILLECTOMY     TOOTH EXTRACTION Bilateral 07/31/2020   Procedure: DENTAL RESTORATION/EXTRACTIONS;  Surgeon: Diona Browner, DDS;  Location: [place];  Service: Oral Surgery;  Laterality: Bilateral;    Family History  Problem Relation Age of Onset   Cancer Mother        uterine cancer   Emphysema Mother    Heart failure Father    Coronary artery disease Father    Emphysema Father    Emphysema Sister    Cancer Brother        neck/throat cancer   Emphysema Brother     Social History   Occupational History   Occupation: Retired  Comment: Dealer   Occupation: Retired Pension scheme manager  Tobacco Use   Smoking status: Former    Packs/day: 3.00    Years: 59.00    Additional pack years: 0.00    Total pack years: 177.00    Types: Cigarettes    Start date: 1958    Quit date: 2017    Years since quitting: 7.2   Smokeless tobacco: Never   Tobacco comments:    quit smoking cigarettes in 2015  Vaping Use   Vaping Use: Never used  Substance and Sexual Activity   Alcohol use: No    Comment: states he quit many years ago   Drug use: No    Comment: previous h/o cocaine use, denies since 2006   Sexual activity: Not Currently    Physical Exam: Blood pressure 112/80, pulse 75, temperature 97.6 F (36.4 C), temperature source Oral, height 5\' 6"  (1.676 m), weight 163 lb (73.9 kg), SpO2 99  %.  Gen: elderly, no distress, in wheel chair Lungs: diminished, clear CV: RRR systolic murmur  Data Reviewed: Imaging: CT Angio from June 2023 - severe emphysema, 63mm pulmonary nodule in RML   PFTs:  Pulmonary function testing sept 2021 demonstrates very severe airflow limitation with an FEV1 of 34% and positive BD response. severely reduced DLCO and lung volumes show hyperinflation consistent with emphysema.  Immunization status: Immunization History  Administered Date(s) Administered   Fluad Quad(high Dose 65+) 05/29/2019, 06/18/2020   Influenza, High Dose Seasonal PF 07/27/2018   Moderna Sars-Covid-2 Vaccination 11/28/2019, 12/26/2019   Pneumococcal Conjugate-13 12/05/2018   Zoster Recombinat (Shingrix) 02/09/2018, 12/05/2018    Assessment:  Severe centrilobular emphysema FEV1 less 35%, stable 155-pack-year smoking history, quit 2015, stable Status post aortic valve replacement, now with recurrent aortic stenosis. Coronary artery disease status post CABG Solitary pulmonary nodule, need for lung cancer screening Seasonal allergic rhinitis  Plan/Recommendations: continue Breztri 2 puffs twice a day.  continue albuterol prn with nebs.   Will add montelukast and flonase for allergic rhinitis, not well controlled. Continue cetirizine.  Regarding Lung cancer screening and SPN, His next scan would be due in June 2024. He has a scan scheduled in May for CT Angio chest for aortic aneuryms monitoring which should be sufficient.   Return to Care: Return in about 6 months (around 06/24/2023).  Lenice Llamas, MD Pulmonary and [place]

## 2023-01-06 ENCOUNTER — Ambulatory Visit: Payer: 59 | Attending: Cardiovascular Disease | Admitting: Pharmacist

## 2023-01-06 DIAGNOSIS — E785 Hyperlipidemia, unspecified: Secondary | ICD-10-CM | POA: Diagnosis not present

## 2023-01-06 MED ORDER — ATORVASTATIN CALCIUM 40 MG PO TABS: ORAL_TABLET | ORAL | 3 refills | Status: DC

## 2023-01-06 NOTE — Patient Instructions (Signed)
Start taking atorvastatin 40mg  daily. Put the bottle with your furosemide and potassium so you remember Lab work on 6/24. You should be fasting. You can come anytime between 7:15 AM and 4:30 PM
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
## 2023-01-10 ENCOUNTER — Encounter: Payer: Self-pay | Admitting: Internal Medicine

## 2023-01-10 ENCOUNTER — Ambulatory Visit: Payer: 59 | Attending: Internal Medicine | Admitting: Internal Medicine

## 2023-01-10 VITALS — BP 126/80 | HR 67 | Ht 66.0 in | Wt 158.8 lb

## 2023-01-10 DIAGNOSIS — E785 Hyperlipidemia, unspecified: Secondary | ICD-10-CM | POA: Diagnosis not present

## 2023-01-10 DIAGNOSIS — I350 Nonrheumatic aortic (valve) stenosis: Secondary | ICD-10-CM

## 2023-01-10 DIAGNOSIS — I10 Essential (primary) hypertension: Secondary | ICD-10-CM

## 2023-01-10 DIAGNOSIS — Z79899 Other long term (current) drug therapy: Secondary | ICD-10-CM | POA: Diagnosis not present

## 2023-01-10 NOTE — Patient Instructions (Addendum)
Medication Instructions:   *If you need a refill on your cardiac medications before your next appointment, please call your pharmacy*   Lab Work: Labs in June as planned  If you have labs (blood work) drawn today and your tests are completely normal, you will receive your results only by: MyChart Message (if you have MyChart) OR A paper copy in the mail If you have any lab test that is abnormal or we need to change your treatment, we will call you to review the results.   Testing/Procedures:    Follow-Up: At [facility], you and your health needs are our priority.  As part of our continuing mission to provide you with exceptional heart care, we have created designated Provider Care Teams.  These Care Teams include your primary Cardiologist (physician) and Advanced Practice Providers (APPs -  Physician Assistants and Nurse Practitioners) who all work together to provide you with the care you need, when you need it.  We recommend signing up for the patient portal called "MyChart".  Sign up information is provided on this After Visit Summary.  MyChart is used to connect with patients for Virtual Visits (Telemedicine).  Patients are able to view lab/test results, encounter notes, upcoming appointments, etc.  Non-urgent messages can be sent to your provider as well.   To learn more about what you can do with MyChart, go to [email or web address].    Your next appointment:   6 month(s)  Provider:   Dietrich Pates, MD     Other Instructions

## 2023-01-10 NOTE — Progress Notes (Signed)
Cardiology Office Note   Date:  01/10/2023   ID:  [name] PoliteCharles J [name], DOB 06-Mar-1946, MRN [id number]  PCP:  [name] ContrasAvbuere, Edwin, MD  Cardiologist:   [name] PatesPaula Dariyon Urquilla, MD   F/u of AV dz       History of Present Illness: [name] [name] is a 77 y.o. male with a history of bicuspid AV and AS (s/p bioprosthetic AVR in 2007  25 mm bovine prosthesis, 2007 ), CAD (s/p SVG to RCA 2007)  dilated ascending aorta (followed by [name]), sever COPD, hypertension, atherosclerosis    Echo in May 2023 showed severe prosthethic valve AS    DVI 0.25  LVEF 50%  Aorta 50   R / L heart cath showed mod LAD stenosis; patent graft to RCA     CT for TAVR work up done   Seen by [name] [name]   He has decided against TAVR  I saw hte pt in Jan 2024   He hs been seen in lipid clinic since    Atorvastatin restarted (he stopped due to dizziness, didn't help) Since seen the pt denies CP, denies palpitations     Notes occasional dizziness  No syncope   Notes occasional PND      Current Meds  Medication Sig   acetaminophen (TYLENOL) 500 MG tablet Take 1,000 mg by mouth in the morning, at noon, in the evening, and at bedtime.   albuterol (PROVENTIL) (2.5 MG/3ML) 0.083% nebulizer solution Take 3 mLs (2.5 mg total) by nebulization every 6 (six) hours as needed for wheezing or shortness of breath.   albuterol (VENTOLIN HFA) 108 (90 Base) MCG/ACT inhaler Inhale 2 puffs into the lungs every 4 (four) hours as needed for wheezing or shortness of breath.   amLODipine (NORVASC) 2.5 MG tablet Take 1 tablet (2.5 mg total) by mouth daily.   atorvastatin (LIPITOR) 40 MG tablet TAKE 1 TABLET(40 MG) BY MOUTH DAILY   Budeson-Glycopyrrol-Formoterol (BREZTRI AEROSPHERE) 160-9-4.8 MCG/ACT AERO Inhale 2 puffs into the lungs in the morning and at bedtime.   celecoxib (CELEBREX) 100 MG capsule Take 100 mg by mouth daily.   COMBIVENT RESPIMAT 20-100 MCG/ACT AERS respimat Inhale 1 puff into the lungs 2 (two) times daily as needed.    dextromethorphan-guaiFENesin (MUCINEX DM) 30-600 MG per 12 hr tablet Take 1 tablet by mouth 4 (four) times daily.   fluticasone (FLONASE) 50 MCG/ACT nasal spray Place 1 spray into both nostrils daily.   furosemide (LASIX) 40 MG tablet Take 80mg  - 2 tablets by mouth every other day alternating with 40mg  - 1 tablet by mouth   lisinopril (PRINIVIL,ZESTRIL) 5 MG tablet Take 5 mg by mouth daily.   montelukast (SINGULAIR) 10 MG tablet Take 1 tablet (10 mg total) by mouth at bedtime.   Multiple Vitamin (MULTIVITAMIN WITH MINERALS) TABS tablet Take 1 tablet by mouth every other day. Alive   potassium chloride (KLOR-CON M) 10 MEQ tablet TAKE 2 TABLETS BY MOUTH ON DAYS THAT YOU TAKE 2 LASIX TABLETS AND 1 TABLET ON DAYS THAT YOU TAKE 1 LASIX TABLET   Simethicone 125 MG TABS Take 125 mg by mouth daily as needed (gas).   trolamine salicylate (ASPERCREME) 10 % cream Apply 1 application. topically as needed (Joint pain).     Allergies:   Atenolol   Past Medical History:  Diagnosis Date   Aortic stenosis    s/p bioprosthetic valve replacement   Arthritis    Ascending aortic aneurysm    CAD (coronary artery disease) of bypass  graft    s/p SVG to dRCA   Cardiomyopathy 2006   EF 30%; has history of CHF   CHF (congestive heart failure)    COPD (chronic obstructive pulmonary disease)    FEV1/FVC -> 0.52 (69% of predicted)   Dyspnea    HX: COPD   GERD (gastroesophageal reflux disease)    Heart murmur    Hypertension    Lesion of buccal mucosa    right, with non-restoable abscessed teeth   Osteoarthritis    Tobacco abuse    Wears glasses     Past Surgical History:  Procedure Laterality Date   AORTIC VALVE REPLACEMENT     Bovine valve   AORTIC VALVE REPLACEMENT     bioprosthetic, 01/2006 - cannot find record of surgery (has scar)   COLONOSCOPY     COLONOSCOPY N/A 01/01/2017   Procedure: COLONOSCOPY;  Surgeon: Carman Ching, MD;  Location[facility] ENDOSCOPY;  Service: Endoscopy;  Laterality: N/A;    ESOPHAGOGASTRODUODENOSCOPY N/A 01/01/2017   Procedure: ESOPHAGOGASTRODUODENOSCOPY (EGD);  Surgeon: Carman Ching, MD;  Location: [facility] ENDOSCOPY;  Service: Endoscopy;  Laterality: N/A;   MULTIPLE TOOTH EXTRACTIONS     RIGHT HEART CATH AND CORONARY ANGIOGRAPHY N/A 02/25/2022   Procedure: RIGHT HEART CATH AND CORONARY ANGIOGRAPHY;  Surgeon: Kathleene Hazel, MD;  Location: [facility] INVASIVE CV LAB;  Service: Cardiovascular;  Laterality: N/A;   TONSILLECTOMY     TOOTH EXTRACTION Bilateral 07/31/2020   Procedure: DENTAL RESTORATION/EXTRACTIONS;  Surgeon: Ocie Doyne, DDS;  Location[facility] OR;  Service: Oral Surgery;  Laterality: Bilateral;     Social History:  The patient  reports that he quit smoking about 7 years ago. His smoking use included cigarettes. He started smoking about 66 years ago. He has a 177.00 pack-year smoking history. He has never used smokeless tobacco. He reports that he does not drink alcohol and does not use drugs.   Family History:  The patient's family history includes Cancer in his brother and mother; Coronary artery disease in his father; Emphysema in his brother, father, mother, and sister; Heart failure in his father.    ROS:  Please see the history of present illness. All other systems are reviewed and  Negative to the above problem except as noted.    PHYSICAL EXAM: VS:  BP 126/80   Pulse 67   Ht 5\' 6"  (1.676 m)   Wt 158 lb 12.8 oz (72 kg)   SpO2 97%   BMI 25.63 kg/m   GEN: Elderly 77 yo male in NAD HEENT: normal  Neck: JVP is not elevated     Cardiac: Distant Heart sounds  RRR; Gr I/VI systolic murmur at base  No LE edema  Respiratory: Decreased airflow throughout  No rales     No wheezes GI: soft, nontender, nondistended, No hepatomegaly MS: no deformity Moving all extremities     EKG:  EKG shows SR 67 bpm   First degree AV block    Anterior MI  Lateral MI   R/H heart cath May 2023      Prox RCA to Mid RCA lesion is 100% stenosed.   Ost RCA to Prox RCA  lesion is 70% stenosed.   Mid LAD lesion is 50% stenosed.   SVG graft was visualized by angiography and is normal in caliber.   The graft exhibits no disease.   Moderate mid LAD stenosis. This does not appear to be flow limiting No significant disease in the Circumflex artery The RCA is a large dominant vessel  with diffuse, heavily calcified proximal stenosis. The mid vessel a chronic total occlusion. The distal vessel and branches fill from the patent vein graft.  PCWP 11 mmHg Failure of the bioprosthetic AVR with severe aortic stenosis by echo. I did not cross the aortic valve today.   Recommendations: He has advanced COPD. He is not a candidate for redo AVR. He may be a candidate for TAVR. Will continue with workup for TAVR.   Echo 03/11/22  1. The aortic valve has been repaired/replaced. Aortic valve regurgitation is not visualized. EOA 1.23 cm2, iEOA 0.69 cm2/m2, AT 105 msec, DVI 0.25. Findings consistent with prosthetic valve obstruction. There is a 25 mm bovine pericardial valve present in the aortic position. Procedure Date: 01/18/2006 (Dr. Dorris Fetch). Aortic valve mean gradient measures 23.0 mmHg. Aortic valve Vmax measures 3.14 m/s. 2. Aortic dilatation noted. There is severe aneurysmal dilatation of the aortic root, measuring 50 mm. There is moderate aneurysmal dilatation of the ascending aorta, measuring 48 mm. These measurements are approximate given suboptimal image quality. Recommend gated CTA aorta for best assessment of aortic root, given relative change since last echo and CTA. 3. Left ventricular ejection fraction, by estimation, is 50%. The left ventricle has low normal function. The left ventricle has no regional wall motion abnormalities. There is mild left ventricular hypertrophy. Left ventricular diastolic parameters are consistent with Grade I diastolic dysfunction (impaired relaxation). 4. Right ventricular systolic function is mildly reduced. The right  ventricular size is mildly enlarged. Tricuspid regurgitation signal is inadequate for assessing PA pressure. 5. Right atrial size was mildly dilated. 6. The mitral valve is normal in structure. Trivial mitral valve regurgitation. No evidence of mitral stenosis. 7. The inferior vena cava is dilated in size with >50% respiratory variability, suggesting right atrial pressure of 8 mmHg.   CT    01/12/22 IMPRESSION: 1. Stable appearance of ascending thoracic aortic aneurysm measuring 4.5 cm. Ascending thoracic aortic aneurysm. Recommend semi-annual imaging followup by CTA or MRA and referral to cardiothoracic surgery if not already obtained. This recommendation follows 2010 ACCF/AHA/AATS/ACR/ASA/SCA/SCAI/SIR/STS/SVM Guidelines for the Diagnosis and Management of Patients With Thoracic Aortic Disease. Circulation. 2010; 121: G956-O130. Aortic aneurysm NOS (ICD10-I71.9) 2. Stable postsurgical changes of CABG and aortic valve replacement. 3. There is a new 6 mm solid pulmonary nodule in the right middle lobe. Recommend a non-contrast Chest CT at 6-12 months, then another non-contrast Chest CT at 18-24 months. 4. 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   Lipid Panel    Component Value Date/Time   CHOL 267 (H) 10/24/2022 1157   TRIG 163 (H) 10/24/2022 1157   HDL 78 10/24/2022 1157   CHOLHDL 3.4 10/24/2022 1157   CHOLHDL 3.3 06/17/2022 0000   VLDL 10 03/18/2016 1016   LDLCALC 160 (H) 10/24/2022 1157   LDLCALC 168 (H) 06/17/2022 0000      Wt Readings from Last 3 Encounters:  01/10/23 158 lb 12.8 oz (72 kg)  12/22/22 163 lb (73.9 kg)  11/28/22 159 lb 3.2 oz (72.2 kg)      ASSESSMENT AND PLAN:  1  AV   Pt with hx of bicuspid valve   He is s/p AVR (25 mm bovine pericardial valve in April 2007)  Now with severe prosthetic valve stenosis   Does not want intervention     Activity as tolerated    Keep on current meds   Careful over summer when  hot outside    2  Asc aortic aneurysim  Followes with [facility]  =   3 hypertension BP is OK on current regimen   Contnue    4 CAD   Cath in May 2023   No limiting dz      5 hyperlipidemia.  Resumed statin recently  Check lipids later this spring   6  COPD   Severe   PFTs in 2021 FEV1 ws less than .9 L    Look into portable O2   Check:  BMET and lipid panel later this spring   Follow up in fall  Signed, [name] Pates, MD  01/10/2023 12:14 PM    [facility] [address] [place], [place], [place]  [address] Phone: [phone]; Fax: [phone]

## 2023-02-06 ENCOUNTER — Ambulatory Visit
Admission: RE | Admit: 2023-02-06 | Discharge: 2023-02-06 | Disposition: A | Discharge status: Home / Self Care | Payer: 59 | Source: Ambulatory Visit | Attending: Thoracic Surgery (Cardiothoracic Vascular Surgery) | Admitting: Thoracic Surgery (Cardiothoracic Vascular Surgery)

## 2023-02-06 DIAGNOSIS — R911 Solitary pulmonary nodule: Secondary | ICD-10-CM

## 2023-02-06 DIAGNOSIS — I7121 Aneurysm of the ascending aorta, without rupture: Secondary | ICD-10-CM

## 2023-02-06 MED ORDER — IOPAMIDOL (ISOVUE-370) INJECTION 76%
75.0000 mL | Freq: Once | INTRAVENOUS | Status: AC | PRN
  Administered 2023-02-06: 75 mL via INTRAVENOUS

## 2023-02-07 ENCOUNTER — Ambulatory Visit (INDEPENDENT_AMBULATORY_CARE_PROVIDER_SITE_OTHER): Payer: 59 | Admitting: Thoracic Surgery (Cardiothoracic Vascular Surgery)

## 2023-02-07 ENCOUNTER — Encounter: Payer: Self-pay | Admitting: Thoracic Surgery (Cardiothoracic Vascular Surgery)

## 2023-02-07 VITALS — BP 140/80 | HR 70 | Resp 20 | Ht 66.0 in | Wt 158.0 lb

## 2023-02-07 DIAGNOSIS — I7121 Aneurysm of the ascending aorta, without rupture: Secondary | ICD-10-CM | POA: Diagnosis not present

## 2023-02-07 NOTE — Progress Notes (Signed)
[address]       [name] [name] [address]             [phone]     HPI: [name] returns for follow-up of his ascending aneurysm and a right middle lobe lung nodule.  [name] [name] is a 77 year old man with a history of heavy tobacco abuse, COPD, lung nodule, bicuspid aortic valve, aortic stenosis, bioprosthetic aortic valve replacement, valvular cardiomyopathy, congestive heart failure, hypertension, and an ascending aortic aneurysm.  I did a bioprosthetic aortic valve replacement back in 2007.  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  He has been followed semiannually for that.  In July 2022 an echocardiogram showed prosthetic aortic valve stenosis.  TAVR was recommended, but he refused.  Continues to have exertional dyspnea.  He is not wearing his oxygen today.  He is on 3 L nasal cannula at home, but cannot carry the tank.  Uses albuterol frequently.  Past Medical History:  Diagnosis Date   Aortic stenosis    s/p bioprosthetic valve replacement   Arthritis    Ascending aortic aneurysm (HCC)    CAD (coronary artery disease) of bypass graft    s/p SVG to dRCA   Cardiomyopathy 2006   EF 30%; has history of CHF   CHF (congestive heart failure) (HCC)    COPD (chronic obstructive pulmonary disease) (HCC)    FEV1/FVC -> 0.52 (69% of predicted)   Dyspnea    HX: COPD   GERD (gastroesophageal reflux disease)    Heart murmur    Hypertension    Lesion of buccal mucosa    right, with non-restoable abscessed teeth   Osteoarthritis    Tobacco abuse    Wears glasses     Current Outpatient Medications  Medication Sig Dispense Refill   acetaminophen (TYLENOL) 500 MG tablet Take 1,000 mg by mouth in the morning, at noon, in the evening, and at bedtime.     albuterol (PROVENTIL) (2.5 MG/3ML) 0.083% nebulizer solution Take 3 mLs (2.5 mg total) by nebulization every 6 (six) hours as needed for wheezing or shortness of breath. 75 mL 12    albuterol (VENTOLIN HFA) 108 (90 Base) MCG/ACT inhaler Inhale 2 puffs into the lungs every 4 (four) hours as needed for wheezing or shortness of breath. 8.5 g 11   amLODipine (NORVASC) 2.5 MG tablet Take 1 tablet (2.5 mg total) by mouth daily. 90 tablet 3   atorvastatin (LIPITOR) 40 MG tablet TAKE 1 TABLET(40 MG) BY MOUTH DAILY 90 tablet 3   Budeson-Glycopyrrol-Formoterol (BREZTRI AEROSPHERE) 160-9-4.8 MCG/ACT AERO Inhale 2 puffs into the lungs in the morning and at bedtime. 1 each 11   celecoxib (CELEBREX) 100 MG capsule Take 100 mg by mouth daily.     COMBIVENT RESPIMAT 20-100 MCG/ACT AERS respimat Inhale 1 puff into the lungs 2 (two) times daily as needed.     dextromethorphan-guaiFENesin (MUCINEX DM) 30-600 MG per 12 hr tablet Take 1 tablet by mouth 4 (four) times daily.     fluticasone (FLONASE) 50 MCG/ACT nasal spray Place 1 spray into both nostrils daily. 16 g 5   furosemide (LASIX) 40 MG tablet Take 80mg  - 2 tablets by mouth every other day alternating with 40mg  - 1 tablet by mouth 135 tablet 1   lisinopril (PRINIVIL,ZESTRIL) 5 MG tablet Take 5 mg by mouth daily.     montelukast (SINGULAIR) 10 MG tablet Take 1 tablet (10 mg total) by mouth at  bedtime. 30 tablet 11   Multiple Vitamin (MULTIVITAMIN WITH MINERALS) TABS tablet Take 1 tablet by mouth every other day. Alive     potassium chloride (KLOR-CON M) 10 MEQ tablet TAKE 2 TABLETS BY MOUTH ON DAYS THAT YOU TAKE 2 LASIX TABLETS AND 1 TABLET ON DAYS THAT YOU TAKE 1 LASIX TABLET 135 tablet 2   Simethicone 125 MG TABS Take 125 mg by mouth daily as needed (gas).     trolamine salicylate (ASPERCREME) 10 % cream Apply 1 application. topically as needed (Joint pain).     No current facility-administered medications for this visit.    Physical Exam BP (!) 140/80   Pulse 70   Resp 20   Ht 5\' 6"  (1.676 m)   Wt 158 lb (71.7 kg)   SpO2 91% Comment: RA  BMI 25.19 kg/m  77 year old man in no acute distress Alert and oriented x 3 with no focal  deficits Walks with cane Cardiac regular rate and rhythm, faint systolic murmur, difficult to auscultate heart sounds Lungs diminished breath sounds bilaterally with faint expiratory wheezing  Diagnostic Tests: CT ANGIOGRAPHY CHEST WITH CONTRAST   TECHNIQUE: Multidetector CT imaging of the chest was performed using the standard protocol during bolus administration of intravenous contrast. Multiplanar CT image reconstructions and MIPs were obtained to evaluate the vascular anatomy.   RADIATION DOSE REDUCTION: This exam was performed according to the departmental dose-optimization program which includes automated exposure control, adjustment of the mA and/or kV according to patient size and/or use of iterative reconstruction technique.   CONTRAST:  75mL ISOVUE-370 IOPAMIDOL (ISOVUE-370) INJECTION 76%   COMPARISON:  07/20/2022   FINDINGS: Cardiovascular: Measured in matching coronal plane, the ascending thoracic aorta measures 4.4 cm in diameter (image 53/series 8) compared to 4.5 cm on prior. No interval change. The transverse arch and descending thoracic aorta normal caliber.   Prior aortic valve replacement noted.  Post CABG anatomy.   Mediastinum/Nodes: No axillary or supraclavicular adenopathy. No mediastinal or hilar adenopathy. No pericardial fluid. Esophagus normal.   Lungs/Pleura: 6 mm pulmonary nodule in the RIGHT middle lobe (image 94/7) is unchanged. Centrilobular emphysema the upper lobes.   Upper Abdomen: Limited view of the liver, kidneys, pancreas are unremarkable. Normal adrenal glands.   Musculoskeletal: No acute osseous abnormality.   Review of the MIP images confirms the above findings.   IMPRESSION: 1. Stable aneurysmal dilatation of the ascending thoracic aorta at 4.4 cm. 2. Stable small RIGHT middle lobe pulmonary nodule. 3. Emphysema.   Emphysema (ICD10-J43.9).     Electronically Signed   By: Genevive Bi M.D.   On: 02/06/2023 12:26 I  personally reviewed the CT images.  No significant change in 5 mm right middle lobe lung nodule.  Severe diffuse emphysematous changes.  No change in ascending aneurysm measuring about 4.5 cm, up to 4.9 cm in the sinuses of Valsalva.  Impression: [name] [name] is a 77 year old man with a history of heavy tobacco abuse, COPD, bicuspid aortic valve, aortic stenosis, bioprosthetic aortic valve replacement in 2007, prosthetic aortic valve stenosis, ascending aortic aneurysm, valvular cardiomyopathy, congestive heart failure, hypertension, and a lung nodule.  Ascending aneurysm-no change in 4.5 cm ascending aneurysm.  Measures about 4.9 cm at the sinuses of Valsalva.  Poor candidate for surgery.  Continue to follow every 6 months at his request.  Prosthetic aortic valve stenosis-medical management.  Refused TAVR.  Right middle lobe lung nodule-stable at about 5.5 cm.  Will continue to follow.  COPD-end-stage COPD.  On 3  L nasal cannula at home.  Followed by Dr. Celine Mans.  Plan: Return in 6 months with CT chest.  Will avoid IV contrast unless there is a new finding that indicates it is necessary.  Loreli Slot, MD [facility] [phone]

## 2023-03-27 ENCOUNTER — Ambulatory Visit: Payer: 59 | Attending: Internal Medicine

## 2023-03-27 DIAGNOSIS — Z79899 Other long term (current) drug therapy: Secondary | ICD-10-CM

## 2023-03-27 DIAGNOSIS — I10 Essential (primary) hypertension: Secondary | ICD-10-CM

## 2023-03-27 DIAGNOSIS — I350 Nonrheumatic aortic (valve) stenosis: Secondary | ICD-10-CM

## 2023-03-27 DIAGNOSIS — E785 Hyperlipidemia, unspecified: Secondary | ICD-10-CM

## 2023-03-28 LAB — BASIC METABOLIC PANEL
BUN/Creatinine Ratio: 15 (ref 10–24)
BUN: 17 mg/dL (ref 8–27)
CO2: 25 mmol/L (ref 20–29)
Calcium: 9.2 mg/dL (ref 8.6–10.2)
Chloride: 99 mmol/L (ref 96–106)
Creatinine, Ser: 1.13 mg/dL (ref 0.76–1.27)
Glucose: 108 mg/dL — ABNORMAL HIGH (ref 70–99)
Potassium: 4.3 mmol/L (ref 3.5–5.2)
Sodium: 138 mmol/L (ref 134–144)
eGFR: 68 mL/min/{1.73_m2} (ref >59)

## 2023-03-28 LAB — LIPID PANEL
Chol/HDL Ratio: 2.7 ratio (ref 0.0–5.0)
Cholesterol, Total: 208 mg/dL — ABNORMAL HIGH (ref 100–199)
HDL: 77 mg/dL (ref >39)
LDL Chol Calc (NIH): 105 mg/dL — ABNORMAL HIGH (ref 0–99)
Triglycerides: 150 mg/dL — ABNORMAL HIGH (ref 0–149)
VLDL Cholesterol Cal: 26 mg/dL (ref 5–40)

## 2023-03-28 LAB — HEPATIC FUNCTION PANEL
ALT: 15 IU/L (ref 0–44)
AST: 17 IU/L (ref 0–40)
Albumin: 4.2 g/dL (ref 3.8–4.8)
Alkaline Phosphatase: 32 IU/L — ABNORMAL LOW (ref 44–121)
Bilirubin Total: 0.6 mg/dL (ref 0.0–1.2)
Bilirubin, Direct: 0.15 mg/dL (ref 0.00–0.40)
Total Protein: 6.5 g/dL (ref 6.0–8.5)

## 2023-03-29 ENCOUNTER — Telehealth: Payer: Self-pay | Admitting: Pharmacist

## 2023-03-29 DIAGNOSIS — E785 Hyperlipidemia, unspecified: Secondary | ICD-10-CM

## 2023-03-29 DIAGNOSIS — Z79899 Other long term (current) drug therapy: Secondary | ICD-10-CM

## 2023-03-29 MED ORDER — EZETIMIBE 10 MG PO TABS: 10.0000 mg | ORAL_TABLET | Freq: Every day | ORAL | 3 refills | Status: DC

## 2023-03-29 NOTE — Addendum Note (Signed)
Addended by: Malena Peer D on: 03/29/2023 02:39 PM   Modules accepted: Orders

## 2023-03-29 NOTE — Telephone Encounter (Signed)
Spoke to the pt, explained Pharm D request repeat lipid panel in 3 months. Pt is scheduled for lab appointment on 06/20/23 , advised pt to fast prior to appointment. Pt voiced understanding.

## 2023-03-29 NOTE — Telephone Encounter (Signed)
Spoke with patient. He states he moved him atorvastatin to the kitchen and is remembering to take it. LDL-C improved about 34%. Maybe some missed doses, but patient denies this. Patient agreeable to adding zetia.  If his LDL-C is still above goal, he would be a good candidate for Leqvio as he has medicare dual complete. Recheck labs in 2 months.

## 2023-03-29 NOTE — Addendum Note (Signed)
Addended by: Trudie Reed D on: 03/29/2023 02:52 PM   Modules accepted: Orders

## 2023-04-12 ENCOUNTER — Telehealth: Payer: Self-pay

## 2023-04-12 NOTE — Telephone Encounter (Signed)
-----   Message from Olene Floss, RPH-CPP sent at 04/12/2023  4:38 PM EDT ----- Regarding: RE: Lipids I started him on zetia about 2 weeks ago. Lets move his labs up to 1-2 weeks from now. Then I will talk to him on the phone about Leqvio if still high. I had a dual complete denied recently for not trying Repatha, so we will see if we can get approved.   ----- Message ----- From: Bertram Millard, RN Sent: 04/12/2023   2:59 PM EDT To: [facility] Pharmd Subject: Lipids                                         Good afternoon, this is from Dr Tenny Craw:   LDL is still too high given CAD I would recomm Leqvio     Follow up lpids and liver in 12 wks   He has been in the Lipid clinic before.. can I get him another appt to come back or should I place a new referral? Last time seen by Melissa was 01/2023.   Thank yo for your help, Dewayne Hatch

## 2023-04-12 NOTE — Telephone Encounter (Signed)
Left a message for him to call back to move up his lab appt.

## 2023-04-13 NOTE — Telephone Encounter (Signed)
Left a message for the pt to call back.  

## 2023-04-14 ENCOUNTER — Telehealth: Payer: Self-pay | Admitting: Internal Medicine

## 2023-04-14 NOTE — Telephone Encounter (Signed)
PT calling for Neb solution w/Albuterol and proaire Rescue inhaler.  Ilda Basset is [facility] on [address]  His # is [phone]

## 2023-04-17 ENCOUNTER — Other Ambulatory Visit: Payer: Self-pay

## 2023-04-17 DIAGNOSIS — J4489 Other specified chronic obstructive pulmonary disease: Secondary | ICD-10-CM

## 2023-04-17 DIAGNOSIS — J439 Emphysema, unspecified: Secondary | ICD-10-CM

## 2023-04-17 DIAGNOSIS — J432 Centrilobular emphysema: Secondary | ICD-10-CM

## 2023-04-17 MED ORDER — ALBUTEROL SULFATE (2.5 MG/3ML) 0.083% IN NEBU: 2.5000 mg | INHALATION_SOLUTION | Freq: Four times a day (QID) | RESPIRATORY_TRACT | 12 refills | Status: AC | PRN

## 2023-04-17 MED ORDER — ALBUTEROL SULFATE HFA 108 (90 BASE) MCG/ACT IN AERS: 2.0000 | INHALATION_SPRAY | RESPIRATORY_TRACT | 11 refills | Status: DC | PRN

## 2023-04-17 NOTE — Telephone Encounter (Signed)
Spoke with patient. Advised refills of neb solution and albuterol has been sent to pharmacy. NFN

## 2023-04-19 NOTE — Telephone Encounter (Signed)
Left another message for the pt to call back.  

## 2023-05-29 ENCOUNTER — Other Ambulatory Visit: Payer: Self-pay | Admitting: Internal Medicine

## 2023-05-30 LAB — CBC
HCT: 42.4 % (ref 38.5–50.0)
Hemoglobin: 14.2 g/dL (ref 13.2–17.1)
MCH: 32.1 pg (ref 27.0–33.0)
MCHC: 33.5 g/dL (ref 32.0–36.0)
MCV: 95.7 fL (ref 80.0–100.0)
MPV: 11.7 fL (ref 7.5–12.5)
Platelets: 210 10*3/uL (ref 140–400)
RBC: 4.43 10*6/uL (ref 4.20–5.80)
RDW: 12.1 % (ref 11.0–15.0)
WBC: 6.2 10*3/uL (ref 3.8–10.8)

## 2023-05-30 LAB — COMPLETE METABOLIC PANEL WITH GFR
AG Ratio: 2 (calc) (ref 1.0–2.5)
ALT: 14 U/L (ref 9–46)
AST: 18 U/L (ref 10–35)
Albumin: 4.1 g/dL (ref 3.6–5.1)
Alkaline phosphatase (APISO): 29 U/L — ABNORMAL LOW (ref 35–144)
BUN: 17 mg/dL (ref 7–25)
CO2: 25 mmol/L (ref 20–32)
Calcium: 9.3 mg/dL (ref 8.6–10.3)
Chloride: 104 mmol/L (ref 98–110)
Creat: 0.86 mg/dL (ref 0.70–1.28)
Globulin: 2.1 g/dL (ref 1.9–3.7)
Glucose, Bld: 105 mg/dL — ABNORMAL HIGH (ref 65–99)
Potassium: 4.3 mmol/L (ref 3.5–5.3)
Sodium: 139 mmol/L (ref 135–146)
Total Bilirubin: 0.5 mg/dL (ref 0.2–1.2)
Total Protein: 6.2 g/dL (ref 6.1–8.1)
eGFR: 90 mL/min/{1.73_m2} (ref >=60)

## 2023-05-30 LAB — LIPID PANEL
Cholesterol: 246 mg/dL — ABNORMAL HIGH (ref <200)
HDL: 75 mg/dL (ref >=40)
LDL Cholesterol (Calc): 148 mg/dL — ABNORMAL HIGH
Non-HDL Cholesterol (Calc): 171 mg/dL — ABNORMAL HIGH (ref <130)
Total CHOL/HDL Ratio: 3.3 (calc) (ref <5.0)
Triglycerides: 116 mg/dL (ref <150)

## 2023-05-30 LAB — TSH: TSH: 0.7 m[IU]/L (ref 0.40–4.50)

## 2023-06-02 ENCOUNTER — Telehealth: Payer: Self-pay | Admitting: Pharmacist

## 2023-06-02 NOTE — Telephone Encounter (Addendum)
I spoke with patient about his labs. LDL-C is up. Reports he is not taking his atorvastatin. Reports he had rectal bleeding and he stopped all his medications and the bleeding is under control. CBC from last week was WNL. Asked if he spoke with his doctors about this. He states that he had bleeding before and he went to the hospital and "all they did was a colonoscopy and sent me home" [facility] they took him off his blood thinner but then put him back on (he was referring to celebrex). He states he isnt taking anymore. He is SOB. I advised that he needs to resume his heart medications and fluid pills. He is not interested in lipid clinic visit. Has a hard time getting to appointments. Also offered a telephone visit but patient declined.

## 2023-06-05 NOTE — Telephone Encounter (Signed)
Patient last seen in April 2024   Has stopped meds    Needs to get back into clinic    Would recomm he resume meds

## 2023-06-07 NOTE — Telephone Encounter (Signed)
Left a message for the pt to call back.  

## 2023-06-15 ENCOUNTER — Ambulatory Visit: Payer: 59 | Admitting: Internal Medicine

## 2023-06-19 NOTE — Telephone Encounter (Signed)
Pt to see Perlie Gold PA on 06/21/23 to go over his meds and be assessed and will have Lipids drawn for the Pharm D at that time.   Per Dr Tenny Craw:   Patient last seen in April 2024   Has stopped meds    Needs to get back into clinic    Would recomm he resume meds

## 2023-06-20 ENCOUNTER — Ambulatory Visit: Payer: 59

## 2023-06-20 NOTE — Progress Notes (Unsigned)
Cardiology Office Note:   Date:  06/21/2023  ID:  [name] [name], DOB March 28, 1946, MRN [id number] PCP: Fleet Contras, MD  [facility] Providers Cardiologist:  Dietrich Pates, MD    History of Present Illness:   [name] [name] has a history of bicuspid AV and AS (s/p bioprosthetic AVR in 2007 25 mm bovine prosthesis, 2007 now with severe prosthetic valve AS), CAD (s/p SVG to RCA 2007) dilated ascending aorta (followed by [name]), severe COPD, hypertension, atherosclerosis, hyperlipidemia, prior tobacco use.  He presents with concerns about his medication regimen. He reports discontinuing all medications, including atorvastatin, Celebrex, and lisinopril, around the beginning of August due to severe diarrhea and rectal bleeding. The patient attributes the diarrhea to the atorvastatin and the bleeding to the Celebrex, which he believes exacerbated his hemorrhoids. He reports that the cessation of these medications led to the resolution of both the diarrhea and the bleeding.  The patient also reports discontinuing his blood pressure medication due to persistent dizziness, which he believes was caused by blood loss from the rectal bleeding. He has been managing his blood pressure with dietary garlic and has noticed an improvement in his dizziness since discontinuing his medications.  The patient has a history of aortic valve repair and coronary artery grafting, but he was unaware of the latter. He expresses a strong desire to avoid further surgical interventions, including valve replacement, due to concerns about potential complications and the need for a pacemaker. He seems aware of the risk of not repairing his valve.  The patient has been managing his fluid levels with Lasix and potassium, which he continued to take even after discontinuing his other medications. He reports no current swelling in his legs and no worsening of his chronic shortness of breath since stopping his medications. He  also reports an improvement in his energy levels and sleep quality.  The patient has a history of smoking but has since quit. He reports consuming alcohol in his coffee and in a nightly cocktail, which he believes helps manage phlegm in his throat. He has been managing his cholesterol through dietary changes, focusing on foods with zero cholesterol.  The patient has a history of rectal bleeding, which he attributes to hemorrhoids exacerbated by his medication. He has managed this with a combination of witch hazel, Preparation H, and Neosporin, which has reportedly stopped the bleeding. He has required blood transfusion in the past due to significant blood loss, which he attributed to ASA and has not taken in several years.   Of note, patient with May 2023 echocardiogram showing severe prosthetic valve AS with DVI 0.25. Patient was seen by Dr. Clifton James but decided against TAVR.   Today patient denies chest pain, shortness of breath, lower extremity edema, fatigue, palpitations, melena, hematuria, hemoptysis, diaphoresis, weakness, presyncope, syncope, orthopnea, and PND.   Studies Reviewed:    02/08/22 TTE  IMPRESSIONS     1. Left ventricular ejection fraction, by estimation, is 20 to 25%. The  left ventricle has severely decreased function. The left ventricle  demonstrates global hypokinesis. The left ventricular internal cavity size  was moderately dilated. There is mild  left ventricular hypertrophy. Left ventricular diastolic parameters are  consistent with Grade I diastolic dysfunction (impaired relaxation).  Elevated left atrial pressure.   2. Right ventricular systolic function is moderately reduced. The right  ventricular size is mildly enlarged.   3. Left atrial size was moderately dilated.   4. Right atrial size was mildly dilated.  5. The mitral valve is normal in structure. Mild mitral valve  regurgitation. No evidence of mitral stenosis.   6. The aortic valve has been  repaired/replaced. Aortic valve  regurgitation is trivial. Moderate aortic valve stenosis. There is a valve  present in the aortic position. Procedure Date: 01/18/06.   7. Aortic dilatation noted. There is moderate dilatation of the aortic  root, measuring 50 mm. There is moderate dilatation of the ascending  aorta, measuring 46 mm.   8. The inferior vena cava is normal in size with greater than 50%  respiratory variability, suggesting right atrial pressure of 3 mmHg.   FINDINGS   Left Ventricle: Left ventricular ejection fraction, by estimation, is 20  to 25%. The left ventricle has severely decreased function. The left  ventricle demonstrates global hypokinesis. The left ventricular internal  cavity size was moderately dilated.  There is mild left ventricular hypertrophy. Left ventricular diastolic  parameters are consistent with Grade I diastolic dysfunction (impaired  relaxation). Elevated left atrial pressure.   Right Ventricle: The right ventricular size is mildly enlarged. Right  ventricular systolic function is moderately reduced.   Left Atrium: Left atrial size was moderately dilated.   Right Atrium: Right atrial size was mildly dilated.   Pericardium: There is no evidence of pericardial effusion.   Mitral Valve: The mitral valve is normal in structure. Mild mitral valve  regurgitation. No evidence of mitral valve stenosis.   Tricuspid Valve: The tricuspid valve is normal in structure. Tricuspid  valve regurgitation is not demonstrated. No evidence of tricuspid  stenosis.   Aortic Valve: The aortic valve has been repaired/replaced. Aortic valve  regurgitation is trivial. Moderate aortic stenosis is present. Aortic  valve mean gradient measures 19.3 mmHg. Aortic valve peak gradient  measures 33.0 mmHg. Aortic valve area, by VTI   measures 1.04 cm. There is a valve present in the aortic position.  Procedure Date: 01/18/06.   Pulmonic Valve: The pulmonic valve was normal  in structure. Pulmonic valve  regurgitation is mild. No evidence of pulmonic stenosis.   Aorta: Aortic dilatation noted. There is moderate dilatation of the aortic  root, measuring 50 mm. There is moderate dilatation of the ascending  aorta, measuring 46 mm.   Venous: The inferior vena cava is normal in size with greater than 50%  respiratory variability, suggesting right atrial pressure of 3 mmHg.   IAS/Shunts: No atrial level shunt detected by color flow Doppler.   Additional Comments: Severe global reduction in LV systolic function; s/p  AVR with elevated mean gradient (19 mmHg); AVA 1 cm2; DI 0.20 suggesting  prosthetic aortic valve dysfunction. LV function worse compared to  previous.   02/25/22 LHC/RHC    Prox RCA to Mid RCA lesion is 100% stenosed.   Ost RCA to Prox RCA lesion is 70% stenosed.   Mid LAD lesion is 50% stenosed.   SVG graft was visualized by angiography and is normal in caliber.   The graft exhibits no disease.   Moderate mid LAD stenosis. This does not appear to be flow limiting No significant disease in the Circumflex artery The RCA is a large dominant vessel with diffuse, heavily calcified proximal stenosis. The mid vessel a chronic total occlusion. The distal vessel and branches fill from the patent vein graft.  PCWP 11 mmHg Failure of the bioprosthetic AVR with severe aortic stenosis by echo. I did not cross the aortic valve today.  Risk Assessment/Calculations:     HYPERTENSION CONTROL Vitals:   06/21/23  1432 06/21/23 1445  BP: (!) 130/92 (!) 140/94    The patient's blood pressure is elevated above target today.  In order to address the patient's elevated BP: Blood pressure will be monitored at home to determine if medication changes need to be made.; A current anti-hypertensive medication was adjusted today.           Physical Exam:   VS:  BP (!) 140/94   Pulse 66   Ht 5\' 6"  (1.676 m)   Wt 152 lb (68.9 kg)   SpO2 96%   BMI 24.53 kg/m     Wt Readings from Last 3 Encounters:  06/21/23 152 lb (68.9 kg)  02/07/23 158 lb (71.7 kg)  01/10/23 158 lb 12.8 oz (72 kg)     Physical Exam Vitals reviewed.  Constitutional:      Appearance: Normal appearance.  HENT:     Head: Normocephalic.  Eyes:     Pupils: Pupils are equal, round, and reactive to light.  Cardiovascular:     Rate and Rhythm: Normal rate and regular rhythm.     Pulses: Normal pulses.     Heart sounds: Murmur (3/6 systolic murmur at RUSB) heard.  Pulmonary:     Effort: Pulmonary effort is normal.     Comments: Distant breath sounds in all fields Abdominal:     General: Abdomen is flat.     Palpations: Abdomen is soft.  Musculoskeletal:     Right lower leg: No edema.     Left lower leg: No edema.  Skin:    General: Skin is warm and dry.     Capillary Refill: Capillary refill takes less than 2 seconds.  Neurological:     General: No focal deficit present.     Mental Status: He is alert and oriented to person, place, and time.  Psychiatric:        Mood and Affect: Mood normal.        Behavior: Behavior normal.        Thought Content: Thought content normal.        Judgment: Judgment normal.      ASSESSMENT AND PLAN:    Hyperlipidemia Patient stopped atorvastatin due to diarrhea. LDL last checked was 148, which is above the target of <70 (ideally less than 55 with patient being high risk). Patient is willing to consider alternative statin or different lipid therapy if current labs show elevated cholesterol. -Check lipid panel today. -Consider alternative statin or PCSK9 if LDL remains elevated.  Hypertension Patient stopped amlodipine and lisinopril due to concerns about dizziness and bleeding. Blood pressure today was 130/92, which is above target. Patient agrees to restart medications. -Restart amlodipine 2.5mg  daily and lisinopril 5mg  daily. -Check renal function in 1 month.  Aortic stenosis s/p AVR Prosthetic Valve Dysfunction Patient has  known prosthetic aortic valve dysfunction but declines further intervention/TAVR due to concerns about potential need for pacemaker. Patient understands risk of progression of symptoms including worsening HF and shortness of breath. -No change in management at this time.  Systolic Heart Failure May 2023 TTE with LVEF 20-25% with global hypokinesis. LHC without progression of disease and decrease in LVEF likely due to AV dysfunction. Patient reports stable fluid status and is currently taking Lasix and potassium. -Patient not on beta blocker due to historical intolerance/allergy -Resume Lisinopril 5mg  (patient had stopped but agreeable to resuming since he has this at home already). Will likely need titration of dose or transition to more optimal therapy such as Entresto. -  Could consider Spironolactone -Continue Lasix. Patient taking 40mg  daily with occasional extra dose for shortness of breath/LE edema.  -Avoid NSAIDs such as Celebrex due to systolic CHF (patient used to take but says has stopped).  Gastrointestinal Bleeding Patient reports history of GI bleeding due to hemorrhoids, which he has managed with over-the-counter treatments. No current bleeding.  CAD 2023 LHC without clear evidence of progressive disease to explain his LVEF 20-25%. Patient without chest pain or worsening shortness of breath.  -Patient not on ASA due to exacerbation of GI bleeding/hemorrhoid bleeding -Discussed importance of lipid management and BP management.   Ascending aortic aneurysm Following with Dr. Dorris Fetch. Continue serial imaging  Follow-up -Schedule appointment with clinical pharmacy in 1 month to review medication tolerability and side effects. -Repeat metabolic panel at that time. -Will also work to have patient follow up with Dr. Tenny Craw.          Signed, Perlie Gold, PA-C

## 2023-06-21 ENCOUNTER — Encounter: Payer: Self-pay | Admitting: Cardiology

## 2023-06-21 ENCOUNTER — Ambulatory Visit: Payer: 59 | Attending: Cardiology | Admitting: Cardiology

## 2023-06-21 ENCOUNTER — Ambulatory Visit: Payer: 59

## 2023-06-21 VITALS — BP 140/94 | HR 66 | Ht 66.0 in | Wt 152.0 lb

## 2023-06-21 DIAGNOSIS — I10 Essential (primary) hypertension: Secondary | ICD-10-CM

## 2023-06-21 DIAGNOSIS — Z79899 Other long term (current) drug therapy: Secondary | ICD-10-CM

## 2023-06-21 DIAGNOSIS — I2510 Atherosclerotic heart disease of native coronary artery without angina pectoris: Secondary | ICD-10-CM

## 2023-06-21 DIAGNOSIS — E785 Hyperlipidemia, unspecified: Secondary | ICD-10-CM

## 2023-06-21 DIAGNOSIS — I350 Nonrheumatic aortic (valve) stenosis: Secondary | ICD-10-CM

## 2023-06-21 DIAGNOSIS — K922 Gastrointestinal hemorrhage, unspecified: Secondary | ICD-10-CM

## 2023-06-21 DIAGNOSIS — I7121 Aneurysm of the ascending aorta, without rupture: Secondary | ICD-10-CM

## 2023-06-21 DIAGNOSIS — I5022 Chronic systolic (congestive) heart failure: Secondary | ICD-10-CM

## 2023-06-21 MED ORDER — LISINOPRIL 5 MG PO TABS: 5.0000 mg | ORAL_TABLET | Freq: Every day | ORAL | 3 refills | Status: DC

## 2023-06-21 MED ORDER — AMLODIPINE BESYLATE 2.5 MG PO TABS: 2.5000 mg | ORAL_TABLET | Freq: Every day | ORAL | 3 refills | Status: AC

## 2023-06-21 NOTE — Patient Instructions (Signed)
Medication Instructions:  Your physician has recommended you make the following change in your medication:   RESTART the Amlodipine and Lisinopril  *If you need a refill on your cardiac medications before your next appointment, please call your pharmacy*   Lab Work: 1 MONTH (AT TIME OF PHARM D APPT)  BMET  If you have labs (blood work) drawn today and your tests are completely normal, you will receive your results only by: MyChart Message (if you have MyChart) OR A paper copy in the mail If you have any lab test that is abnormal or we need to change your treatment, we will call you to review the results.   Testing/Procedures: None ordered   Follow-Up: At [facility], you and your health needs are our priority.  As part of our continuing mission to provide you with exceptional heart care, we have created designated Provider Care Teams.  These Care Teams include your primary Cardiologist (physician) and Advanced Practice Providers (APPs -  Physician Assistants and Nurse Practitioners) who all work together to provide you with the care you need, when you need it.  We recommend signing up for the patient portal called "MyChart".  Sign up information is provided on this After Visit Summary.  MyChart is used to connect with patients for Virtual Visits (Telemedicine).  Patients are able to view lab/test results, encounter notes, upcoming appointments, etc.  Non-urgent messages can be sent to your provider as well.   To learn more about what you can do with MyChart, go to [email or web address].    Your next appointment:   1 month(s)  Provider:   Pharm D for cholesterol / blood pressure managemetn     Other Instructions

## 2023-06-22 ENCOUNTER — Telehealth: Payer: Self-pay

## 2023-06-22 LAB — LIPID PANEL
Chol/HDL Ratio: 4.2 ratio (ref 0.0–5.0)
Cholesterol, Total: 275 mg/dL — ABNORMAL HIGH (ref 100–199)
HDL: 66 mg/dL (ref >39)
LDL Chol Calc (NIH): 173 mg/dL — ABNORMAL HIGH (ref 0–99)
Triglycerides: 194 mg/dL — ABNORMAL HIGH (ref 0–149)
VLDL Cholesterol Cal: 36 mg/dL (ref 5–40)

## 2023-06-22 NOTE — Telephone Encounter (Signed)
Need OV with Dr Tenny Craw.

## 2023-06-22 NOTE — Telephone Encounter (Signed)
-----   Message from Nurse Deon Pilling sent at 06/21/2023  5:27 PM EDT ----- Regarding: FW: Arrange f/u with primary cardiologist  ----- Message ----- From: Perlie Gold, PA-C Sent: 06/21/2023   5:03 PM EDT To: [facility] Triage Subject: Arrange f/u with primary cardiologist          Patient seen acutely for medication discussion today and will have appt soon with pharmD. I would like to make sure he has an appt with Dr. Tenny Craw at next available appt (next 2-3 months) if possible as well given complex valve hx and her familiarity with him.  Perlie Gold PA-C.

## 2023-07-13 ENCOUNTER — Other Ambulatory Visit: Payer: Self-pay | Admitting: Thoracic Surgery (Cardiothoracic Vascular Surgery)

## 2023-07-13 DIAGNOSIS — I7121 Aneurysm of the ascending aorta, without rupture: Secondary | ICD-10-CM

## 2023-07-18 NOTE — Telephone Encounter (Signed)
Called to offer the pt an appt with Dr Tenny Craw today since she had a cancellation but the pt declined.. he could not make it.. he will keep his 07/21/23 OV with the Pharm D.

## 2023-07-21 ENCOUNTER — Ambulatory Visit: Payer: 59

## 2023-07-21 ENCOUNTER — Ambulatory Visit: Payer: 59 | Attending: Cardiology

## 2023-08-02 ENCOUNTER — Telehealth: Payer: Self-pay | Admitting: Internal Medicine

## 2023-08-02 ENCOUNTER — Ambulatory Visit: Payer: 59 | Attending: Cardiology | Admitting: Student

## 2023-08-02 ENCOUNTER — Telehealth: Payer: Self-pay | Admitting: Pharmacist

## 2023-08-02 ENCOUNTER — Encounter: Payer: Self-pay | Admitting: Student

## 2023-08-02 ENCOUNTER — Other Ambulatory Visit (HOSPITAL_COMMUNITY): Payer: Self-pay

## 2023-08-02 ENCOUNTER — Telehealth: Payer: Self-pay | Admitting: Pharmacy Technician

## 2023-08-02 DIAGNOSIS — E785 Hyperlipidemia, unspecified: Secondary | ICD-10-CM | POA: Diagnosis not present

## 2023-08-02 DIAGNOSIS — J439 Emphysema, unspecified: Secondary | ICD-10-CM

## 2023-08-02 MED ORDER — FENOFIBRATE 145 MG PO TABS: 145.0000 mg | ORAL_TABLET | Freq: Every day | ORAL | 3 refills | Status: AC

## 2023-08-02 MED ORDER — ROSUVASTATIN CALCIUM 5 MG PO TABS: 5.0000 mg | ORAL_TABLET | Freq: Every day | ORAL | 3 refills | Status: DC

## 2023-08-02 NOTE — Telephone Encounter (Signed)
Pharmacy Patient Advocate Encounter   Received notification from Pt Calls Messages that prior authorization for repatha is required/requested.   Insurance verification completed.   The patient is insured through [facility] .   Per test claim: PA required; PA submitted to above mentioned insurance via CoverMyMeds Key/confirmation #/EOC [id number] Status is pending

## 2023-08-02 NOTE — Progress Notes (Unsigned)
Patient ID: [name] [name]                 DOB: [birth year]                    MRN: [id number]      HPI: [name] [name] is a 77 y.o. male patient referred to lipid clinic by Dr.Ross. 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  Today patient presented for lipid clinic. Reports he gets sever diarrhea from atorvastatin and Zetia. He is on blood thinner so with frequent BM worsen hemorrhoids and leads to blood loss. His ability to move is limited due to SOB from COPD. He lost all his teeth so dependent on soft food only. Cook at home does not eat out.  Discussed last lipid lab in detail with TG and LDL goal. Reviewed options for lowering LDL cholesterol, including other statins, Vascepa, Tricor, PCSK-9 inhibitors, bempedoic acid and inclisiran.  Discussed mechanisms of action, dosing, side effects and potential decreases in LDL cholesterol.  Also reviewed cost information and potential options for patient assistance.  Current Medications: none Intolerances: Atorvastatin 40 mg and Zetia 10 mg  Risk Factors: hypertension ,CAD LDL goal: <70 Last LDLc 173 and TG 194 TC 275 (06/2023)   Diet: eats mainly home cooked meals, due to teeth loss  not been able to eat much   Exercise:  due to persistent dizziness and SOB unable to perform any exercise  Family History:  Relation Problem Comments  Mother (Deceased) Cancer uterine cancer  Emphysema     Father (Deceased) Coronary artery disease   Emphysema   Heart failure     Sister Metallurgist) Emphysema     Sister Metallurgist)   Brother (Deceased) Cancer neck/throat cancer  Emphysema       Social History:  Alcohol: small amount of whisky everyday Smoking: quit 15 years ago   Labs: Lipid Panel      Component Value Date/Time   CHOL 275 (H) 06/21/2023 1431   TRIG 194 (H) 06/21/2023 1431   HDL 66 06/21/2023 1431   CHOLHDL 4.2 06/21/2023 1431   CHOLHDL 3.3 05/29/2023 0000   VLDL 10 03/18/2016 1016   LDLCALC 173 (H) 06/21/2023 1431   LDLCALC 148 (H) 05/29/2023 0000   LABVLDL 36 06/21/2023 1431    Past Medical History:  Diagnosis Date   Aortic stenosis    s/p bioprosthetic valve replacement   Arthritis    Ascending aortic aneurysm (HCC)    CAD (coronary artery disease) of bypass graft    s/p SVG to dRCA   Cardiomyopathy 2006   EF 30%; has history of CHF   CHF (congestive heart failure) (HCC)    COPD (chronic obstructive pulmonary disease) (HCC)    FEV1/FVC -> 0.52 (69% of predicted)   Dyspnea    HX: COPD   GERD (gastroesophageal reflux disease)    Heart murmur    Hypertension    Lesion of buccal mucosa    right, with non-restoable abscessed teeth   Osteoarthritis    Tobacco abuse    Wears glasses     Current Outpatient Medications on File Prior to Visit  Medication Sig Dispense Refill   acetaminophen (TYLENOL) 500 MG tablet Take 1,000 mg by mouth  in the morning, at noon, in the evening, and at bedtime.     albuterol (PROVENTIL) (2.5 MG/3ML) 0.083% nebulizer solution Take 3 mLs (2.5 mg total) by nebulization every 6 (six) hours as needed for wheezing or shortness of breath. 75 mL 12   albuterol (VENTOLIN HFA) 108 (90 Base) MCG/ACT inhaler Inhale 2 puffs into the lungs every 4 (four) hours as needed for wheezing or shortness of breath. 8.5 g 11   amLODipine (NORVASC) 2.5 MG tablet Take 1 tablet (2.5 mg total) by mouth daily. 90 tablet 3   Budeson-Glycopyrrol-Formoterol (BREZTRI AEROSPHERE) 160-9-4.8 MCG/ACT AERO Inhale 2 puffs into the lungs in the morning and at bedtime. 1 each 11   COMBIVENT RESPIMAT 20-100 MCG/ACT AERS respimat Inhale 1 puff into the lungs 2 (two) times daily as needed.     dextromethorphan-guaiFENesin (MUCINEX DM) 30-600 MG per 12 hr tablet Take 1  tablet by mouth 4 (four) times daily.     fluticasone (FLONASE) 50 MCG/ACT nasal spray Place 1 spray into both nostrils daily. 16 g 5   furosemide (LASIX) 40 MG tablet Take 80mg  - 2 tablets by mouth every other day alternating with 40mg  - 1 tablet by mouth 135 tablet 1   lisinopril (ZESTRIL) 5 MG tablet Take 1 tablet (5 mg total) by mouth daily. 90 tablet 3   Multiple Vitamin (MULTIVITAMIN WITH MINERALS) TABS tablet Take 1 tablet by mouth every other day. Alive     potassium chloride (KLOR-CON M) 10 MEQ tablet TAKE 2 TABLETS BY MOUTH ON DAYS THAT YOU TAKE 2 LASIX TABLETS AND 1 TABLET ON DAYS THAT YOU TAKE 1 LASIX TABLET 135 tablet 2   trolamine salicylate (ASPERCREME) 10 % cream Apply 1 application. topically as needed (Joint pain).     No current facility-administered medications on file prior to visit.    Allergies  Allergen Reactions   Atenolol Shortness Of Breath    Assessment/Plan:  1. Hyperlipidemia -  Problem  Dyslipidemia   Qualifier: Diagnosis of  By: Huntley Dec, Scott  Current Medications: none Intolerances: Atorvastatin 40 mg and Zetia 10 mg  Risk Factors: hypertension ,CAD LDL goal: <70 Last LDLc 173 and TG 194 TC 275 (06/2023)    Dyslipidemia Assessment:  TG goal <150 mg/dl, LDL goal < 70 mg/dl last LDLc 086 mg/dl TG 578 mg/dl  (46/9629) Intolerance to atorvastatin and Zetia -severe diarrhea  Discussed last lipid lab in detail with TG and LDL goal.  Reviewed options for lowering LDL cholesterol, including other statins, Vascepa, Tricor, PCSK-9 inhibitors, bempedoic acid and inclisiran Exercise capacity is limited due to COPD and due to edentulism food choices are limited   Plan: Patient is in agreement to try rosuvastatin low dose and plan does not covered Vascepa until patient try and fail tire 1 TG lowering medication like Tricor  Start taking rosuvastatin 5 mg daily and Tricor 145 mg daily  Moderate intensity statin will not be enough to lower LDLc at goal  Will apply for PA for PCSK9i; will inform patient upon approval  Lipid lab due in 2-3 months of therapy modification     Thank you,  Carmela Hurt, Pharm.D [facility] [address], [place], [place] [address]  Phone: [phone]; Fax: [phone]

## 2023-08-02 NOTE — Assessment & Plan Note (Signed)
Assessment:  TG goal <150 mg/dl, LDL goal < 70 mg/dl last LDLc 161 mg/dl TG 096 mg/dl  (01/5408) Intolerance to atorvastatin and Zetia -severe diarrhea  Discussed last lipid lab in detail with TG and LDL goal.  Reviewed options for lowering LDL cholesterol, including other statins, Vascepa, Tricor, PCSK-9 inhibitors, bempedoic acid and inclisiran Exercise capacity is limited due to COPD and due to edentulism food choices are limited   Plan: Patient is in agreement to try rosuvastatin low dose and plan does not covered Vascepa until patient try and fail tire 1 TG lowering medication like Tricor  Start taking rosuvastatin 5 mg daily and Tricor 145 mg daily  Moderate intensity statin will not be enough to lower LDLc at goal Will apply for PA for PCSK9i; will inform patient upon approval  Lipid lab due in 2-3 months of therapy modification

## 2023-08-02 NOTE — Patient Instructions (Signed)
Your Results:             Your most recent labs Goal  Total Cholesterol 275 < 200  Triglycerides 194 < 150  HDL (happy/good cholesterol) 66 > 40  LDL (lousy/bad cholesterol 173 < 70   Medication changes: Start taking rosuvastatin 5 mg daily and fenofibrate 145 mg daily  We will start the process to get PCSK9i (Repatha or Praluent)  covered by your insurance.  Once the prior authorization is complete, we will call you to let you know and confirm pharmacy information.   Praluent is a cholesterol medication that improved your body's ability to get rid of "bad cholesterol" known as LDL. It can lower your LDL up to 60%. It is an injection that is given under the skin every 2 weeks. The most common side effects of Praluent include runny nose, symptoms of the common cold, rarely flu or flu-like symptoms, back/muscle pain in about 3-4% of the patients, and redness, pain, or bruising at the injection site.    Repatha is a cholesterol medication that improved your body's ability to get rid of "bad cholesterol" known as LDL. It can lower your LDL up to 60%! It is an injection that is given under the skin every 2 weeks. The most common side effects of Repatha include runny nose, symptoms of the common cold, rarely flu or flu-like symptoms, back/muscle pain in about 3-4% of the patients, and redness, pain, or bruising at the injection site.   Lab orders: We want to repeat labs after 2-3 months.  We will send you a lab order to remind you once we get closer to that time.

## 2023-08-02 NOTE — Telephone Encounter (Signed)
Patient states needs refill for Breztri inhaler. Pharmacy is [facility] [address] Patient phone number is [phone].

## 2023-08-02 NOTE — Telephone Encounter (Addendum)
Pharmacy Patient Advocate Encounter  Received notification from [facility] that Prior Authorization for repatha has been APPROVED from 08/02/23 to 01/31/24. Ran test claim, Copay is $0.00- one month. This test claim was processed through [facility]- copay amounts may vary at other pharmacies due to pharmacy/plan contracts, or as the patient moves through the different stages of their insurance plan.   PA #/Case ID/Reference #: [id number]

## 2023-08-03 NOTE — Telephone Encounter (Signed)
Pt is  a week overdue for his inhaler [employer]

## 2023-08-04 MED ORDER — BREZTRI AEROSPHERE 160-9-4.8 MCG/ACT IN AERO: 2.0000 | INHALATION_SPRAY | Freq: Two times a day (BID) | RESPIRATORY_TRACT | 0 refills | Status: DC

## 2023-08-04 NOTE — Telephone Encounter (Signed)
Made patient aware refill sent. He has upcoming appt with ND only sent 1 refill just in case something changes at visit.

## 2023-08-07 ENCOUNTER — Inpatient Hospital Stay: Admission: RE | Admit: 2023-08-07 | Payer: 59 | Source: Ambulatory Visit

## 2023-08-09 ENCOUNTER — Other Ambulatory Visit (HOSPITAL_COMMUNITY): Payer: Self-pay

## 2023-08-09 MED ORDER — REPATHA 140 MG/ML ~~LOC~~ SOSY: 140.0000 mg | PREFILLED_SYRINGE | SUBCUTANEOUS | 3 refills | Status: AC

## 2023-08-09 NOTE — Addendum Note (Signed)
Addended by: Tylene Fantasia on: 08/09/2023 09:59 AM   Modules accepted: Orders

## 2023-08-09 NOTE — Telephone Encounter (Signed)
Patient made aware of approval and follow up lab due on Nov 01 2023

## 2023-08-14 NOTE — Telephone Encounter (Signed)
Prior Authorization for Repatha has been APPROVED from 08/02/23 to 01/31/24. See other encounter

## 2023-08-15 ENCOUNTER — Ambulatory Visit: Payer: 59 | Admitting: Thoracic Surgery (Cardiothoracic Vascular Surgery)

## 2023-08-16 ENCOUNTER — Ambulatory Visit (INDEPENDENT_AMBULATORY_CARE_PROVIDER_SITE_OTHER): Payer: 59 | Admitting: Internal Medicine

## 2023-08-16 ENCOUNTER — Encounter: Payer: Self-pay | Admitting: Internal Medicine

## 2023-08-16 VITALS — BP 140/88 | HR 93 | Ht 66.0 in | Wt 149.0 lb

## 2023-08-16 DIAGNOSIS — J4489 Other specified chronic obstructive pulmonary disease: Secondary | ICD-10-CM | POA: Diagnosis not present

## 2023-08-16 DIAGNOSIS — J9611 Chronic respiratory failure with hypoxia: Secondary | ICD-10-CM | POA: Diagnosis not present

## 2023-08-16 DIAGNOSIS — J439 Emphysema, unspecified: Secondary | ICD-10-CM

## 2023-08-16 MED ORDER — ARFORMOTEROL TARTRATE 15 MCG/2ML IN NEBU: 15.0000 ug | INHALATION_SOLUTION | Freq: Two times a day (BID) | RESPIRATORY_TRACT | 11 refills | Status: DC

## 2023-08-16 MED ORDER — YUPELRI 175 MCG/3ML IN SOLN: 175.0000 ug | Freq: Every day | RESPIRATORY_TRACT | 11 refills | Status: DC

## 2023-08-16 MED ORDER — BUDESONIDE 0.5 MG/2ML IN SUSP: 0.5000 mg | Freq: Every day | RESPIRATORY_TRACT | 11 refills | Status: DC

## 2023-08-16 NOTE — Progress Notes (Signed)
[name] [name]    [id number]    April 15, 1946  Primary Care Physician:Avbuere, Dorma Russell, MD Date of Appointment: 08/16/2023 Established Patient Visit    Chief complaint:   Chief Complaint  Patient presents with   Follow-up    COPD F/U    HPI: History of COPD.   History includes: AV dz (s/p AVR in 2007 ), CAD (s/p SVG to RCA)  dilated ascending aorta, COPD, hypertension, atherosclerosis, moderate LV dysfunction in the past. Over 150 pack years, quit 2015.  Interval Updates: Here for follow up. Feels like breztri is no longer working. Can't get any medicine into his lungs. Using albuterol many times a day. Barely leaves the house. Doesn't want to wear oxygen.   No interval hospitalizations/ED visits.   I have reviewed the patient's family social and past medical history and updated as appropriate.   Past Medical History:  Diagnosis Date   Aortic stenosis    s/p bioprosthetic valve replacement   Arthritis    Ascending aortic aneurysm (HCC)    CAD (coronary artery disease) of bypass graft    s/p SVG to dRCA   Cardiomyopathy 2006   EF 30%; has history of CHF   CHF (congestive heart failure) (HCC)    COPD (chronic obstructive pulmonary disease) (HCC)    FEV1/FVC -> 0.52 (69% of predicted)   Dyspnea    HX: COPD   GERD (gastroesophageal reflux disease)    Heart murmur    Hypertension    Lesion of buccal mucosa    right, with non-restoable abscessed teeth   Osteoarthritis    Tobacco abuse    Wears glasses     Past Surgical History:  Procedure Laterality Date   AORTIC VALVE REPLACEMENT     Bovine valve   AORTIC VALVE REPLACEMENT     bioprosthetic, 01/2006 - cannot find record of surgery (has scar)   COLONOSCOPY     COLONOSCOPY N/A 01/01/2017   Procedure: COLONOSCOPY;  Surgeon: Carman Ching, MD;  Location: [facility] ENDOSCOPY;  Service: Endoscopy;  Laterality: N/A;   ESOPHAGOGASTRODUODENOSCOPY N/A 01/01/2017   Procedure: ESOPHAGOGASTRODUODENOSCOPY (EGD);  Surgeon:  Carman Ching, MD;  Location: [facility] ENDOSCOPY;  Service: Endoscopy;  Laterality: N/A;   MULTIPLE TOOTH EXTRACTIONS     RIGHT HEART CATH AND CORONARY ANGIOGRAPHY N/A 02/25/2022   Procedure: RIGHT HEART CATH AND CORONARY ANGIOGRAPHY;  Surgeon: Kathleene Hazel, MD;  Location: [facility] INVASIVE CV LAB;  Service: Cardiovascular;  Laterality: N/A;   TONSILLECTOMY     TOOTH EXTRACTION Bilateral 07/31/2020   Procedure: DENTAL RESTORATION/EXTRACTIONS;  Surgeon: Ocie Doyne, DDS;  Location: [facility] OR;  Service: Oral Surgery;  Laterality: Bilateral;    Family History  Problem Relation Age of Onset   Cancer Mother        uterine cancer   Emphysema Mother    Heart failure Father    Coronary artery disease Father    Emphysema Father    Emphysema Sister    Cancer Brother        neck/throat cancer   Emphysema Brother     Social History   Occupational History   Occupation: Retired    Comment: Curator   Occupation: Retired Teaching laboratory technician  Tobacco Use   Smoking status: Former    Current packs/day: 0.00    Average packs/day: 3.0 packs/day for 59.0 years (177.0 ttl pk-yrs)    Types: Cigarettes    Start date: 95    Quit date: 2017  Years since quitting: 7.8   Smokeless tobacco: Never   Tobacco comments:    quit smoking cigarettes in 2015  Vaping Use   Vaping status: Never Used  Substance and Sexual Activity   Alcohol use: No    Comment: states he quit many years ago   Drug use: No    Comment: previous h/o cocaine use, denies since 2006   Sexual activity: Not Currently    Physical Exam: Blood pressure (!) 140/88, pulse 93, height 5\' 6"  (1.676 m), weight 149 lb (67.6 kg), SpO2 94%.  Gen: elderly, frail Lungs: diminished, clear CV: diminished, tachycardic, reguar systolic murmur  Data Reviewed: Imaging: CT Angio from June 2023 - severe emphysema, 4mm pulmonary nodule in RML   PFTs:  Pulmonary function testing sept 2021 demonstrates very severe airflow limitation with an FEV1 of  34% and positive BD response. severely reduced DLCO and lung volumes show hyperinflation consistent with emphysema.  Immunization status: Immunization History  Administered Date(s) Administered   Fluad Quad(high Dose 65+) 05/29/2019, 06/18/2020   Influenza, High Dose Seasonal PF 07/27/2018   Moderna Sars-Covid-2 Vaccination 11/28/2019, 12/26/2019   Pneumococcal Conjugate-13 12/05/2018   Zoster Recombinant(Shingrix) 02/09/2018, 12/05/2018    Assessment:  Severe centrilobular emphysema FEV1 less 35%, stable 155-pack-year smoking history, quit 2015, stable Status post aortic valve replacement, now with recurrent aortic stenosis. Coronary artery disease status post CABG Solitary pulmonary nodule, need for lung cancer screening Seasonal allergic rhinitis  Plan/Recommendations: I am switching your Breztri to the nebulized forms of these medications:  Yupelri - once daily Brovana - twice daily Pulmicort - once daily.  They are being sent to a specialty pharmacy. Once you get them stop the breztri and switch to these. You can keep taking the albuterol through inhaler and nebulizer.   Keep wearing your oxygen to keep levels over 88%. Your CT scan in May showed a small nodule which already has a follow up scan scheduled.   Call me sooner if you need me.   Has 4mm pulmonary nodule - CT Chest for follow up has already been ordered.   Return to Care: Return in about 3 months (around 11/16/2023).  Durel Salts, MD Pulmonary and Critical Care Medicine [facility] Office:[phone]

## 2023-08-16 NOTE — Patient Instructions (Addendum)
It was a pleasure to see you today!  Please schedule follow up scheduled with myself in 3 months.  If my schedule is not open yet, we will contact you with a reminder closer to that time. Please call [phone] if you haven't heard from [place] a month before, and always call us sooner if issues or concerns arise. You can also send [place] a message through MyChart, but but aware that this is not to be used for urgent issues and it may take up to 5-7 days to receive a reply. Please be aware that you will likely be able to view your results before I have a chance to respond to them. Please give [place] 5 business days to respond to any non-urgent results.    I am switching your Markus Daft to the nebulized forms of these medications:  Yupelri - once daily Brovana - twice daily Pulmicort - once daily.  They are being sent to a specialty pharmacy. Once you get them stop the breztri and switch to these. You can keep taking the albuterol through inhaler and nebulizer.   Keep wearing your oxygen to keep levels over 88%. Your CT scan in May showed a small nodule which already has a follow up scan scheduled.   Call me sooner if you need me.

## 2023-09-13 ENCOUNTER — Telehealth: Payer: Self-pay | Admitting: Internal Medicine

## 2023-09-13 DIAGNOSIS — J439 Emphysema, unspecified: Secondary | ICD-10-CM

## 2023-09-13 NOTE — Telephone Encounter (Signed)
[facility] called requesting a prescription for nebulizer and supplies. Please advise- call back at [phone]

## 2023-09-21 NOTE — Telephone Encounter (Signed)
Rx printed awaiting physician signature.

## 2023-09-21 NOTE — Telephone Encounter (Signed)
Rx faxed along with demographics.

## 2023-09-25 ENCOUNTER — Telehealth: Payer: Self-pay | Admitting: Internal Medicine

## 2023-09-25 MED ORDER — BUDESONIDE-FORMOTEROL FUMARATE 160-4.5 MCG/ACT IN AERO: 2.0000 | INHALATION_SPRAY | Freq: Two times a day (BID) | RESPIRATORY_TRACT | 11 refills | Status: DC

## 2023-09-25 MED ORDER — ALBUTEROL SULFATE (2.5 MG/3ML) 0.083% IN NEBU: 2.5000 mg | INHALATION_SOLUTION | Freq: Four times a day (QID) | RESPIRATORY_TRACT | 1 refills | Status: DC | PRN

## 2023-09-25 NOTE — Telephone Encounter (Signed)
PT was given a new medication, Yupelri, and Dr. Celine Mans asked him to call us if he had any issues.  Pt states that since he used it he can not breath and he has been on 02 ever since.   His # is [phone]  He was on Breztri/

## 2023-09-25 NOTE — Telephone Encounter (Signed)
Spoke with the pt  He is c/o increased SOB ever since started [place]  He started it last wk, and states "it about knocked me out"- made me not be able to see or breathe  He hgas stopped taking med  Vision is normal  He gets winded with any exertion and says he has had to increase his o2 to 8lpm  He could tell me what his pulse ox was He preferred symbicort and says "I just need an inhaler that will work" He denies any cough, wheezing, chest tightness  Dr Celine Mans, please advise, thanks!  Allergies  Allergen Reactions   Atenolol Shortness Of Breath

## 2023-09-25 NOTE — Telephone Encounter (Signed)
Ok to go back to symbicort 160 2 puffs bid, stop yupelri, brovana and budesonide. I suspect his breathing is getting worse because the disease is progressing. Would offer him sooner appointment. If oxygen is not maintaining over 90% at his baseline oxygen, needs ed evaluation

## 2023-09-25 NOTE — Telephone Encounter (Signed)
He states that his oxygen concentrator only goes up to 5L.  He could not see the dial and he had it set 1L.  He increased the liter flow to 3.5 L where it should have been, now his sats go to 97% on 3.5 L.  I provided the recommendations per Dr. Celine Mans.  He stated that he should be getting a 90 day supply of his albuterol nebulizer solution, but now they are only giving him a 6 day supply.  Advised I would call over to his pharmacy and request that they make his refills 90 days as he had previously.  Symbicort 160 sent to pharmacy.  He will call if he needs anything prior to his f/u in February.  Nothing further needed.  [facility] pharmacy and spoke with the the pharmacist regarding the nebulizer solution.  He needs a new script indicationg a 90 day supply in order to provide patient with a 90 day supply.  New script sent in to [facility].

## 2023-10-16 ENCOUNTER — Telehealth: Payer: Self-pay | Admitting: Internal Medicine

## 2023-10-16 MED ORDER — ALBUTEROL SULFATE HFA 108 (90 BASE) MCG/ACT IN AERS: 2.0000 | INHALATION_SPRAY | Freq: Four times a day (QID) | RESPIRATORY_TRACT | 6 refills | Status: DC | PRN

## 2023-10-16 MED ORDER — BUDESONIDE-FORMOTEROL FUMARATE 160-4.5 MCG/ACT IN AERO: 2.0000 | INHALATION_SPRAY | Freq: Two times a day (BID) | RESPIRATORY_TRACT | 11 refills | Status: DC

## 2023-10-16 NOTE — Telephone Encounter (Signed)
 Called and spoke with pt about inhalers. After speaking with pt, Rx for Symbicort which is currently on pt's med list as well as the albuterol inhaler have been sent to pharmacy for pt. Nothing further needed.

## 2023-10-16 NOTE — Telephone Encounter (Signed)
 Patient needs refill of albuterol rescue inhaler and Breztri. Please call patient once refill is completed [phone]  Pharmacy: [facility] [address]

## 2023-10-31 ENCOUNTER — Telehealth: Payer: Self-pay | Admitting: Internal Medicine

## 2023-10-31 NOTE — Telephone Encounter (Signed)
I called and spoke with the pt  He states that he is running out of albuterol inhaler and needs override to get early fill  He is using this "too many times to count" to help with SOB  He says he is unable to use any other meds bc "they all cause bad reaction"- he said all other inhalers make his breathing worse  He is only using the albuterol hfa  I advised he would need to make appt with provider to discuss meds  He states can only come to the office on Wednesdays  I offered this Wed which is tomorrow at 8:45 or 9 am and he refused bc that was too early for his ride to pick him up  He also refused virtual visit bc he does not have access to smartphone  Dr Wynona Neat- Dr Celine Mans is not available so routing to you as the provider of the day

## 2023-10-31 NOTE — Telephone Encounter (Signed)
PT states the Pharm will not refill his Albuterol because it is too early. He states they say they are supposed to refill it every 16 days and they say it is to be refilled every 25 days. He says the 16 day rule has been in place for a long time.   He is out of breath severely and it can be heard while speaking to him. Please call PT to advise. His # is [phone]

## 2023-11-09 NOTE — Telephone Encounter (Signed)
 Called in NFN

## 2023-11-15 ENCOUNTER — Ambulatory Visit: Payer: 59 | Admitting: Internal Medicine

## 2024-01-03 ENCOUNTER — Ambulatory Visit: Payer: 59 | Admitting: Internal Medicine

## 2024-01-03 ENCOUNTER — Encounter: Payer: Self-pay | Admitting: Internal Medicine

## 2024-01-03 VITALS — BP 160/90 | HR 74 | Ht 66.0 in | Wt 154.0 lb

## 2024-01-03 DIAGNOSIS — J439 Emphysema, unspecified: Secondary | ICD-10-CM | POA: Diagnosis not present

## 2024-01-03 DIAGNOSIS — J4489 Other specified chronic obstructive pulmonary disease: Secondary | ICD-10-CM | POA: Diagnosis not present

## 2024-01-03 DIAGNOSIS — J9611 Chronic respiratory failure with hypoxia: Secondary | ICD-10-CM | POA: Diagnosis not present

## 2024-01-03 MED ORDER — ALBUTEROL SULFATE HFA 108 (90 BASE) MCG/ACT IN AERS
INHALATION_SPRAY | RESPIRATORY_TRACT | 11 refills | Status: DC
Start: 2024-01-03 — End: 2024-06-07

## 2024-01-03 MED ORDER — BUDESONIDE-FORMOTEROL FUMARATE 160-4.5 MCG/ACT IN AERO: 2.0000 | INHALATION_SPRAY | Freq: Two times a day (BID) | RESPIRATORY_TRACT | 11 refills | Status: AC

## 2024-01-03 NOTE — Progress Notes (Addendum)
 KAHARI CRITZER    [id number]    1946-07-14  Primary Care Physician:Avbuere, Dorma Russell, MD Date of Appointment: 01/03/2024 Established Patient Visit    Chief complaint:   Chief Complaint  Patient presents with   Follow-up    Copd is getting worse.He is having to use his inhaler more than usual 2 puffs every 2 hr. Needs refill.    HPI: History of COPD.  History includes: AV dz (s/p AVR in 2007 ), CAD (s/p SVG to RCA)  dilated ascending aorta, COPD, hypertension, atherosclerosis, moderate LV dysfunction in the past. Over 150 pack years, quit 2015.  Interval Updates: Here for follow up. Has been on oxygen since 2006. Wants a POC/inogen device.  However he says that he is unable to walk today to qualify for this.  He became quite upset when I asked him to walk so that I could verify exactly how much oxygen he needs for POC.  He is taking intermittently breztri, symbicort, albuterol and combivent.  He says Symbicort works the best but since he takes it more than prescribed he runs out of it and then uses leftover Breztri.  He wanted to stop taking nebulizer treatments and wanted to go back to Symbicort previously.  He repeatedly says that he is suffering and struggling with his breathing and all he wants is to be able to take his inhalers as frequently as he wants to.  He wants to take albuterol as many times as he wants to during the day which is often to 10-12 times a day.  He also says he is suffering and is not interested in any kind of hospice.  He says he watched his brother in hospice care and felt that he really suffered and had an identifying data and does not want that for himself.  When the time comes reviewed not he wants to die alone at home.  He says that if he did have shortness of breath and worsening respiratory symptoms he would not call 911.  He would not want to be intubated he would not want to be on a ventilator.  He would not want chest compressions.  No interval  hospitalizations/ED visits.   I have reviewed the patient's family social and past medical history and updated as appropriate.   Past Medical History:  Diagnosis Date   Aortic stenosis    s/p bioprosthetic valve replacement   Arthritis    Ascending aortic aneurysm (HCC)    CAD (coronary artery disease) of bypass graft    s/p SVG to dRCA   Cardiomyopathy 2006   EF 30%; has history of CHF   CHF (congestive heart failure) (HCC)    COPD (chronic obstructive pulmonary disease) (HCC)    FEV1/FVC -> 0.52 (69% of predicted)   Dyspnea    HX: COPD   GERD (gastroesophageal reflux disease)    Heart murmur    Hypertension    Lesion of buccal mucosa    right, with non-restoable abscessed teeth   Osteoarthritis    Tobacco abuse    Wears glasses     Past Surgical History:  Procedure Laterality Date   AORTIC VALVE REPLACEMENT     Bovine valve   AORTIC VALVE REPLACEMENT     bioprosthetic, 01/2006 - cannot find record of surgery (has scar)   COLONOSCOPY     COLONOSCOPY N/A 01/01/2017   Procedure: COLONOSCOPY;  Surgeon: Carman Ching, MD;  Location: [facility] ENDOSCOPY;  Service: Endoscopy;  Laterality: N/A;   ESOPHAGOGASTRODUODENOSCOPY N/A 01/01/2017   Procedure: ESOPHAGOGASTRODUODENOSCOPY (EGD);  Surgeon: Carman Ching, MD;  Location: [facility] ENDOSCOPY;  Service: Endoscopy;  Laterality: N/A;   MULTIPLE TOOTH EXTRACTIONS     RIGHT HEART CATH AND CORONARY ANGIOGRAPHY N/A 02/25/2022   Procedure: RIGHT HEART CATH AND CORONARY ANGIOGRAPHY;  Surgeon: Kathleene Hazel, MD;  Location: [facility] INVASIVE CV LAB;  Service: Cardiovascular;  Laterality: N/A;   TONSILLECTOMY     TOOTH EXTRACTION Bilateral 07/31/2020   Procedure: DENTAL RESTORATION/EXTRACTIONS;  Surgeon: Ocie Doyne, DDS;  Location: [facility] OR;  Service: Oral Surgery;  Laterality: Bilateral;    Family History  Problem Relation Age of Onset   Cancer Mother        uterine cancer   Emphysema Mother    Heart failure Father    Coronary artery disease  Father    Emphysema Father    Emphysema Sister    Cancer Brother        neck/throat cancer   Emphysema Brother     Social History   Occupational History   Occupation: Retired    Comment: Curator   Occupation: Retired Teaching laboratory technician  Tobacco Use   Smoking status: Former    Current packs/day: 0.00    Average packs/day: 3.0 packs/day for 59.0 years (177.0 ttl pk-yrs)    Types: Cigarettes    Start date: 68    Quit date: 2017    Years since quitting: 8.2   Smokeless tobacco: Never   Tobacco comments:    quit smoking cigarettes in 2015  Vaping Use   Vaping status: Never Used  Substance and Sexual Activity   Alcohol use: No    Comment: states he quit many years ago   Drug use: No    Comment: previous h/o cocaine use, denies since 2006   Sexual activity: Not Currently    Physical Exam: Blood pressure (!) 160/90, pulse 74, height 5\' 6"  (1.676 m), weight 154 lb (69.9 kg), SpO2 95%.  Gen: Elderly, frail Lungs: Diminished, no wheezes or crackles, gets worked up with emotion which causes shortness of breath CV: Tachycardic, regular, systolic murmur  Data Reviewed: Imaging: CT Angio from June 2023 - severe emphysema, 4mm pulmonary nodule in RML   PFTs:  Pulmonary function testing sept 2021 demonstrates very severe airflow limitation with an FEV1 of 34% and positive BD response. severely reduced DLCO and lung volumes show hyperinflation consistent with emphysema.  Immunization status: Immunization History  Administered Date(s) Administered   Fluad Quad(high Dose 65+) 05/29/2019, 06/18/2020   Influenza, High Dose Seasonal PF 07/27/2018   Moderna Sars-Covid-2 Vaccination 11/28/2019, 12/26/2019   Pneumococcal Conjugate-13 12/05/2018   Zoster Recombinant(Shingrix) 02/09/2018, 12/05/2018    Assessment:  Severe centrilobular emphysema FEV1 less 35%, stable 155-pack-year smoking history, quit 2015, stable Status post aortic valve replacement, now with recurrent aortic  stenosis. Coronary artery disease status post CABG Solitary pulmonary nodule, need for lung cancer screening Seasonal allergic rhinitis Chronic respiratory failure on 3LNC  Plan/Recommendations: Mr. Smola has severe COPD with progressive symptoms.  Clinically he is appropriate for outpatient palliative care services but he is adamant that he does not want any involvement of hospice or palliative care.  He says he knows he is dying man.  He would not call 911 if he had respiratory distress and he would prefer to die at home with dignity.  He says he is suffering.  He does not want to suffer.  He does say that he would never want to be  intubated or have chest compressions.  He is emphatic to be DNR  The most important thing to him is that he is able to pick up his inhalers whenever he wants to even if he is using the more than prescribed.  I have refilled his albuterol so that he can have it every 16 days.  We discussed and reviewed extensively of the hazards of taking his inhalers more frequently than prescribed or recommended including cardiac arrhythmia sudden cardiac death urinary retention eye changes cough, thrush.  He is in agreement with this.  At this point I feel its best to take care of the patient and meet him where he is.  I have reviewed the concerns I have with him and he is acknowledge them.  This is what he wants.  Return to Care: Return in about 3 months (around 04/03/2024).  I spent 40 minutes in the care of this patient today including pre-charting, chart review, review of results, face-to-face care, coordination of care and communication with consultants etc.).  Durel Salts, MD Pulmonary and Critical Care Medicine [facility] [phone]

## 2024-01-03 NOTE — Patient Instructions (Addendum)
 It was a pleasure to see you today!  Please schedule follow up scheduled with APP in 3 months.  If my schedule is not open yet, we will contact you with a reminder closer to that time. Please call [phone] if you haven't heard from [place] a month before, and always call us sooner if issues or concerns arise. You can also send [place] a message through MyChart, but but aware that this is not to be used for urgent issues and it may take up to 5-7 days to receive a reply. Please be aware that you will likely be able to view your results before I have a chance to respond to them. Please give [place] 5 business days to respond to any non-urgent results.   Continue the symbicort 2 puffs twice daily I have refilled the albuterol so that you can fill it every 16 days.

## 2024-02-20 IMAGING — CT CT HEART MORP W/ CTA COR W/ SCORE W/ CA W/CM &/OR W/O CM
3 of 9 series · 8 of 20 positions shown, 9 images · IV contrast (APPLIED)
Comparison: Chest CTA 01/12/2022.
COMPARISON: Chest CTA 01/12/2022.

Addendum:
EXAM:
OVER-READ INTERPRETATION  CT CHEST

The following report is a limited chest CT over-read performed by
03/03/2022. The coronary calcium score and cardiac CTA interpretation
by the cardiologist is attached.
CLINICAL DATA: Bioprosthetic Aortic Valve Stenosis. 25 mm
pericardial bioprosthetic aortic valve 01/18/2006. Valve in Valve
TAVR Analysis.
Cardiac TAVR CT
TECHNIQUE: A non-contrast, gated CT scan was obtained with axial slices of 3 mm
through the heart for aortic valve calcium scoring. A 90 kV
retrospective, gated, contrast cardiac scan was obtained. Gantry
rotation speed was 250 msecs and collimation was 0.6 mm.
Nitroglycerin was not given. The 3D data set was reconstructed in 5%
intervals of the 0-95% of the R-R cycle. Systolic and diastolic
phases were analyzed on a dedicated workstation using MPR, MIP, and
VRT modes. The patient received 100 cc of contrast.

[Series 13: 0-90% · axial · 0.39mm/px · z∈[+1313,+1397]mm · 2 of 4160 slices shown]
[im 1387/4160  vessel]
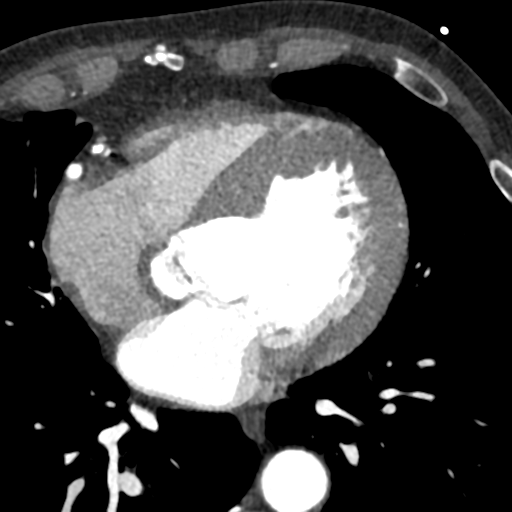
[im 2773/4160  vessel]
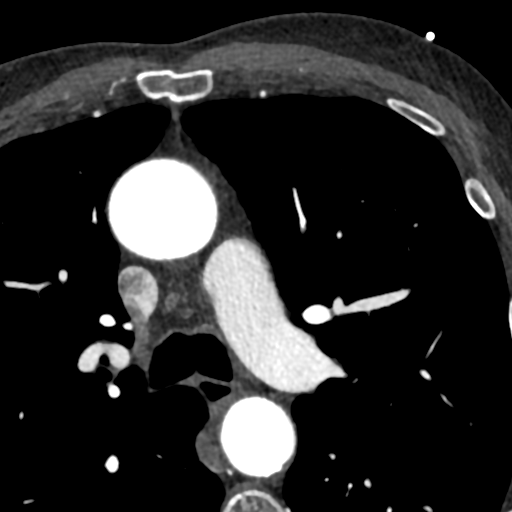

[Series 14: 5-95% · axial · 0.39mm/px · z∈[+1292,+1417]mm · 3 of 4160 slices shown, 4 images]
[im 1040/4160  vessel]
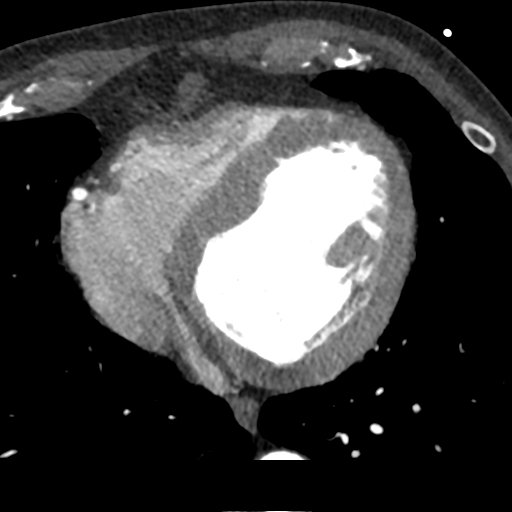
[im 1040/4160  lung]
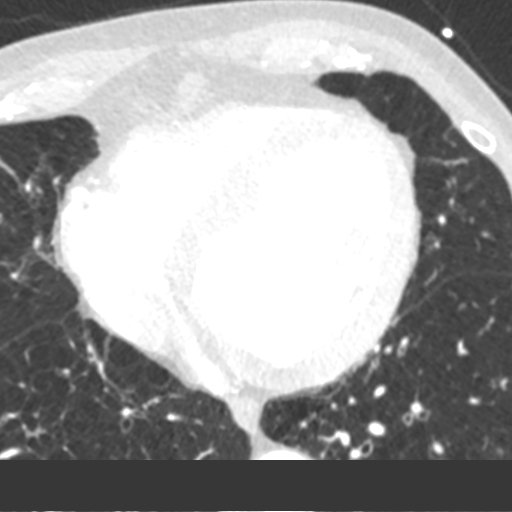
[im 2080/4160  vessel]
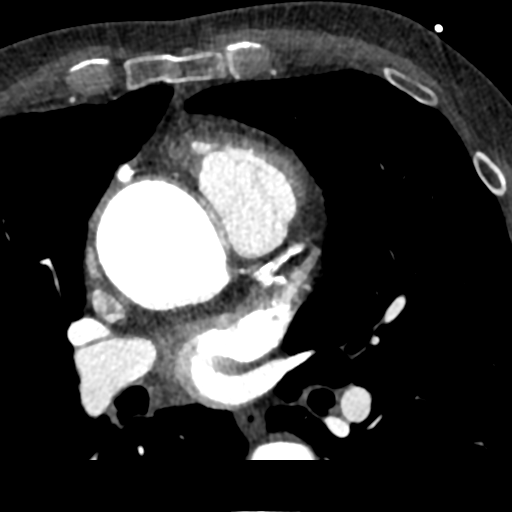
[im 3120/4160  vessel]
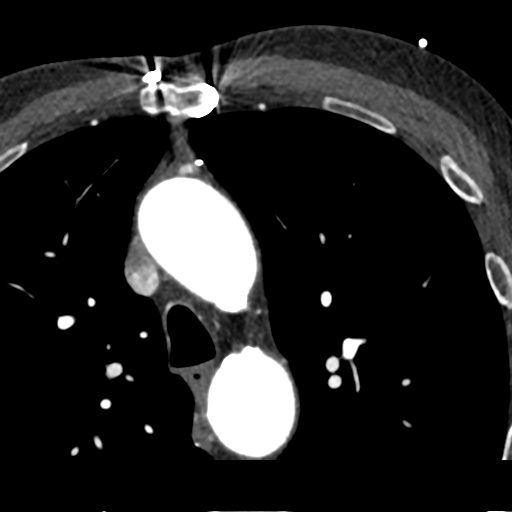

[Series 15: ms recon · axial · 0.39mm/px · z∈[+1292,+1417]mm · 3 of 4160 slices shown]
[im 1040/4160  vessel]
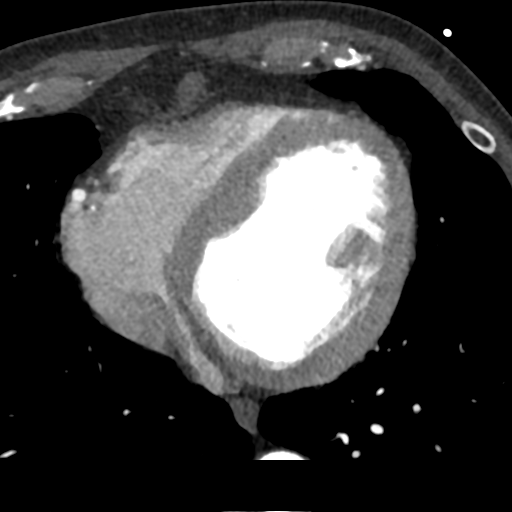
[im 2080/4160  vessel]
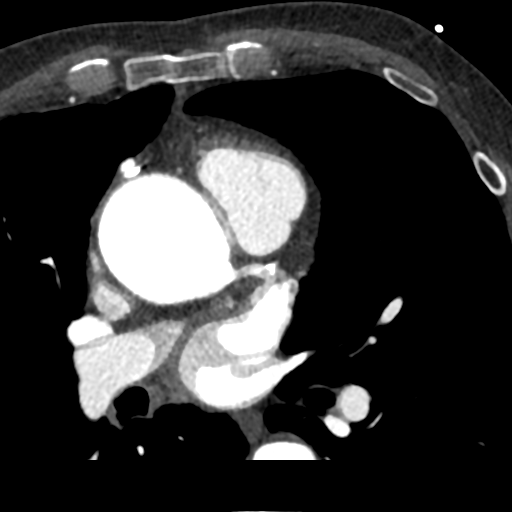
[im 3120/4160  vessel]
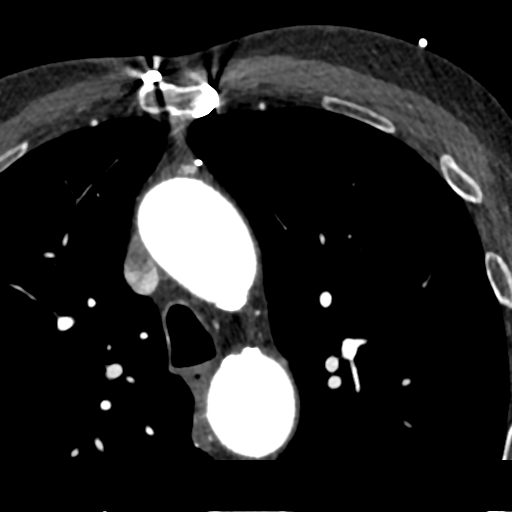

[8 of 20 positions shown; findings below may reference images not displayed]

FINDINGS: Extracardiac findings will be described separately under dictation
for contemporaneously obtained chest abdomen and pelvis CTA.
IMPRESSION: Please see separate dictation for contemporaneously obtained CTA
chest, abdomen and pelvis dated 03/03/2022 for full description of
relevant extracardiac findings.
FINDINGS: Image quality: Average.  Cardiac motion artifact noted.

Noise artifact is: Limited.

Aortic Valve: A 25 mm pericardial bioprosthetic valve is present in
the aortic position. Suspect this is a CE Perimount based on the
appearance. Internal diameter 23 mm is consistent with a 25 mm
prosthesis. The leaflets are severely thickened with restricted
movement in systole.

Valve in valve analysis: A 26 mm virtual transcatheter heart valve
(THV) was used. The virtual THV to coronary distance (PAVAJEAU) for the
RCA was 15.2 mm. The left main ostium has a high take off above the
virtual THV. Overall, low risk for coronary obstruction.

Optimal coplanar projection: RAO 10 MARIANI 2

Sinus of Valsalva Measurements:

Non-coronary: 50 mm

Right-coronary: 49 mm

Left-coronary: 51 mm

Sinotubular Junction: 51 mm

Ascending Thoracic Aorta: 45 mm.

Coronary Arteries: Normal coronary origin. Right dominance. The
study was performed without use of NTG and is insufficient for
plaque evaluation. Please refer to recent cardiac catheterization
for coronary assessment. CHE WAH is patent. 3-vessel coronary
calcifications.

Cardiac Morphology:

Right Atrium: Right atrial size is within normal limits.

Right Ventricle: The right ventricular cavity is within normal
limits.

Left Atrium: Left atrial size is normal in size with no left atrial
appendage filling defect.

Left Ventricle: The ventricular cavity size is within normal limits.
There are no stigmata of prior infarction. There is no abnormal
filling defect. LVEF=.

Pulmonary arteries: Normal in size without proximal filling defect.

Pulmonary veins: Normal pulmonary venous drainage.

Pericardium: Normal thickness with no significant effusion or
calcium present.

Mitral Valve: The mitral valve is normal structure without
significant calcification.

Extra-cardiac findings: See attached radiology report for
non-cardiac structures.
IMPRESSION: 1. 25 mm bioprosthetic aortic prosthesis.

2. Valve in valve analysis performed for 26 mm S3. No risk of
coronary obstruction (RCA PAVAJEAU 15.2 mm; Left main take off above the
virtual THV).

3. Optimal Fluoroscopic Angle for Delivery: RAO 10 MARIANI 2

4. Aortic root aneurysm up to 51 mm (double oblique).

5. Ascending aortic aneurysm up to 45 mm (double oblique).

*** End of Addendum ***
EXAM:
OVER-READ INTERPRETATION  CT CHEST

The following report is a limited chest CT over-read performed by
03/03/2022. The coronary calcium score and cardiac CTA interpretation
by the cardiologist is attached.
FINDINGS: Extracardiac findings will be described separately under dictation
for contemporaneously obtained chest abdomen and pelvis CTA.
IMPRESSION: Please see separate dictation for contemporaneously obtained CTA
chest, abdomen and pelvis dated 03/03/2022 for full description of
relevant extracardiac findings.

## 2024-03-06 ENCOUNTER — Telehealth: Payer: Self-pay | Admitting: *Deleted

## 2024-03-06 NOTE — Telephone Encounter (Signed)
 Fine to change back to duoneb. He generally does not listen to my advice and does not take medications as prescribed. I hope he feels better.

## 2024-03-06 NOTE — Telephone Encounter (Signed)
 Copied from CRM [id number]. Topic: Clinical - Medication Question >> Feb 28, 2024  2:44 PM [name] [name] wrote: Reason for CRM: Sara-pharmacist with [facility] called regarding medication concern the patient. [name] [name] stated the patient is having very severe issues that are not being controlled because the patient is using too much of the medications based on his refill history and has severe shortness of breath. [name] [name] stated his maintenance meds are not working and the patient does not know what else to do. [name] [name] stated she advised the patient needs to be seen before his appointment on July 2nd. [name] [name] stated the patient has refills and it is not a refill request however she does not fill comortable continously reflling the medication to early for the patient. The medication is for albuterol  albuterol  (PROVENTIL ) (2.5 MG/3ML) 0.083% nebulizer solution.  Called and spoke with the patient, he states that he is using the Symbicort  more than 2 puffs twice per day.  He no longer has the Breztri .  He says that he is currently only getting Albuterol  nebulizer solution and he was getting duoneb solution.  I told him that may have happened when he was changed over to all nebulizer solutions (Yupelri , Brovana  and Budesonide ) because we did not want to duplicate therapy.  He said he had a reaction to those nebulizer solutions so he went back to the Symbicort .  He is wearing his oxygen at 3.5 L as ordered.  He is asking for his nebulizer solution to be changed back to Duoneb.  I advised him I would send a message to [name] [name] and once I hear back from him I will call him back and let him know.  He verbalized understanding.  [name] [name], Is is ok to change patient nebs to duo neb instead of albuterol ?  Please advise.  Thank you.

## 2024-03-06 NOTE — Telephone Encounter (Signed)
 Copied from CRM [id number]. Topic: Clinical - Medication Question >> Feb 28, 2024  2:44 PM Corean Deutscher wrote: Reason for CRM: Sara-pharmacist with [facility] called regarding medication concern the patient. [name] [name] stated the patient is having very severe issues that are not being controlled because the patient is using too much of the medications based on his refill history and has severe shortness of breath. [name] [name] stated his maintenance meds are not working and the patient does not know what else to do. [name] [name] stated she advised the patient needs to be seen before his appointment on July 2nd. [name] [name] stated the patient has refills and it is not a refill request however she does not fill comortable continously reflling the medication to early for the patient. The medication is for albuterol  albuterol  (PROVENTIL ) (2.5 MG/3ML) 0.083% nebulizer solution.  Duplicate

## 2024-03-07 MED ORDER — IPRATROPIUM-ALBUTEROL 0.5-2.5 (3) MG/3ML IN SOLN: 3.0000 mL | Freq: Four times a day (QID) | RESPIRATORY_TRACT | 3 refills | Status: AC | PRN

## 2024-03-07 NOTE — Telephone Encounter (Signed)
 Called and spoke with patient, advised him that we are changing his nebulizer solution to duoneb (Ipratropium-albuterol ).  Verified pharmacy and script sent.  He verbalized understanding.  Nothing further needed.

## 2024-04-03 ENCOUNTER — Ambulatory Visit: Admitting: Internal Medicine

## 2024-06-07 ENCOUNTER — Other Ambulatory Visit: Payer: Self-pay | Admitting: Internal Medicine

## 2024-06-07 NOTE — Telephone Encounter (Signed)
 Copied from CRM #[id number]. Topic: Clinical - Medication Refill >> Jun 07, 2024 11:02 AM Rozanna G wrote: Medication: albuterol  (VENTOLIN  HFA) 108 (90 Base) MCG/ACT inhaler  Has the patient contacted their pharmacy? Yes (Agent: If no, request that the patient contact the pharmacy for the refill. If patient does not wish to contact the pharmacy document the reason why and proceed with request.) (Agent: If yes, when and what did the pharmacy advise?)  This is the patient's preferred pharmacy:  [id number] - [place], [place] - [address] AT [facility] [address] [place] [address] Phone: [phone] Fax: [phone]   Is this the correct pharmacy for this prescription? Yes If no, delete pharmacy and type the correct one.   Has the prescription been filled recently? Yes  Is the patient out of the medication? Yes  Has the patient been seen for an appointment in the last year OR does the patient have an upcoming appointment? Yes  Can we respond through MyChart? No  Agent: Please be advised that Rx refills may take up to 3 business days. We ask that you follow-up with your pharmacy.

## 2024-06-10 ENCOUNTER — Other Ambulatory Visit: Payer: Self-pay | Admitting: *Deleted

## 2024-06-11 MED ORDER — ALBUTEROL SULFATE HFA 108 (90 BASE) MCG/ACT IN AERS: INHALATION_SPRAY | RESPIRATORY_TRACT | 11 refills | Status: AC

## 2024-06-12 ENCOUNTER — Other Ambulatory Visit: Payer: Self-pay | Admitting: Thoracic Surgery (Cardiothoracic Vascular Surgery)

## 2024-06-12 ENCOUNTER — Ambulatory Visit: Admitting: Internal Medicine

## 2024-06-12 ENCOUNTER — Encounter: Payer: Self-pay | Admitting: Internal Medicine

## 2024-06-12 VITALS — BP 132/80 | HR 82 | Temp 97.9°F | Ht 66.0 in | Wt 149.0 lb

## 2024-06-12 DIAGNOSIS — R911 Solitary pulmonary nodule: Secondary | ICD-10-CM

## 2024-06-12 DIAGNOSIS — J432 Centrilobular emphysema: Secondary | ICD-10-CM | POA: Diagnosis not present

## 2024-06-12 DIAGNOSIS — Z87891 Personal history of nicotine dependence: Secondary | ICD-10-CM

## 2024-06-12 DIAGNOSIS — J302 Other seasonal allergic rhinitis: Secondary | ICD-10-CM

## 2024-06-12 DIAGNOSIS — J9610 Chronic respiratory failure, unspecified whether with hypoxia or hypercapnia: Secondary | ICD-10-CM

## 2024-06-12 DIAGNOSIS — I7121 Aneurysm of the ascending aorta, without rupture: Secondary | ICD-10-CM

## 2024-06-12 DIAGNOSIS — J449 Chronic obstructive pulmonary disease, unspecified: Secondary | ICD-10-CM

## 2024-06-12 DIAGNOSIS — I712 Thoracic aortic aneurysm, without rupture, unspecified: Secondary | ICD-10-CM

## 2024-06-12 DIAGNOSIS — J9611 Chronic respiratory failure with hypoxia: Secondary | ICD-10-CM

## 2024-06-12 NOTE — Addendum Note (Signed)
 Addended by: Madeline Pho M on: 06/12/2024 02:39 PM   Modules accepted: Orders

## 2024-06-12 NOTE — Progress Notes (Signed)
 [name] [name]    [id number]    June 02, 1946  Primary Care Physician:Avbuere, Aliene, MD Date of Appointment: 06/12/2024 Established Patient Visit    Chief complaint:   Chief Complaint  Patient presents with   COPD    Patient wants to get a POC/[facility] instead of a portable tank.    HPI: History of COPD.  History includes: AV dz (s/p AVR in 2007 ), CAD (s/p SVG to RCA)  dilated ascending aorta, COPD, hypertension, atherosclerosis, moderate LV dysfunction in the past. Over 150 pack years, quit 2015. Home oxygen 2006 3.5LNC  Interval Updates:  Discussed the use of AI scribe software for clinical note transcription with the patient, who gave verbal consent to proceed.  History of Present Illness [name] [name] is a 78 year old male with chronic obstructive pulmonary disease (COPD) who presents with worsening dyspnea and oxygen needs. He is accompanied by his sister.  He has experienced worsening dyspnea and is currently using home oxygen at a rate of 3 to 3.5 liters per minute. Despite efforts by his primary care provider to assist with his energy levels, he continues to experience worsening dyspnea. He has not been hospitalized since his last visit.  He is alternating between Breztri  and Symbicort  inhalers and uses albuterol  as a rescue inhaler. A delay in obtaining his albuterol  prescription due to a pharmacy communication issue led him to purchase an inhaler from a neighbor. He uses albuterol  10 times a day and gets early refills.   He describes a persistent sensation of phlegm in his throat, which he refers to as a 'ball of phlegm,' and notes that Mucinex is no longer effective in alleviating this symptom. No chest pain is present except when consuming hot sauce.  He recalls having a CT scan in May of the previous year and is awaiting a follow-up appointment with Dr. Kerrin with repeat scan.    Previously stated -  He also says he is suffering and is not  interested in any kind of hospice.  He says he watched his brother in hospice care and felt that he really suffered and had an identifying data and does not want that for himself.  When the time comes reviewed not he wants to die alone at home.  He says that if he did have shortness of breath and worsening respiratory symptoms he would not call 911.  He would not want to be intubated he would not want to be on a ventilator.  He would not want chest compressions.  I have reviewed the patient's family social and past medical history and updated as appropriate.   Past Medical History:  Diagnosis Date   Aortic stenosis    s/p bioprosthetic valve replacement   Arthritis    Ascending aortic aneurysm (HCC)    CAD (coronary artery disease) of bypass graft    s/p SVG to dRCA   Cardiomyopathy 2006   EF 30%; has history of CHF   CHF (congestive heart failure) (HCC)    COPD (chronic obstructive pulmonary disease) (HCC)    FEV1/FVC -> 0.52 (69% of predicted)   Dyspnea    HX: COPD   GERD (gastroesophageal reflux disease)    Heart murmur    Hypertension    Lesion of buccal mucosa    right, with non-restoable abscessed teeth   Osteoarthritis    Tobacco abuse    Wears glasses     Past Surgical History:  Procedure Laterality  Date   AORTIC VALVE REPLACEMENT     Bovine valve   AORTIC VALVE REPLACEMENT     bioprosthetic, 01/2006 - cannot find record of surgery (has scar)   COLONOSCOPY     COLONOSCOPY N/A 01/01/2017   Procedure: COLONOSCOPY;  Surgeon: Lynwood Bohr, MD;  Location: [facility] ENDOSCOPY;  Service: Endoscopy;  Laterality: N/A;   ESOPHAGOGASTRODUODENOSCOPY N/A 01/01/2017   Procedure: ESOPHAGOGASTRODUODENOSCOPY (EGD);  Surgeon: Lynwood Bohr, MD;  Location: [facility] ENDOSCOPY;  Service: Endoscopy;  Laterality: N/A;   MULTIPLE TOOTH EXTRACTIONS     RIGHT HEART CATH AND CORONARY ANGIOGRAPHY N/A 02/25/2022   Procedure: RIGHT HEART CATH AND CORONARY ANGIOGRAPHY;  Surgeon: Verlin Lonni BIRCH, MD;   Location: [facility] INVASIVE CV LAB;  Service: Cardiovascular;  Laterality: N/A;   TONSILLECTOMY     TOOTH EXTRACTION Bilateral 07/31/2020   Procedure: DENTAL RESTORATION/EXTRACTIONS;  Surgeon: Sheryle Hamilton, DDS;  Location: [facility] OR;  Service: Oral Surgery;  Laterality: Bilateral;    Family History  Problem Relation Age of Onset   Cancer Mother        uterine cancer   Emphysema Mother    Heart failure Father    Coronary artery disease Father    Emphysema Father    Emphysema Sister    Cancer Brother        neck/throat cancer   Emphysema Brother     Social History   Occupational History   Occupation: Retired    Comment: Curator   Occupation: Retired Teaching laboratory technician  Tobacco Use   Smoking status: Former    Current packs/day: 0.00    Average packs/day: 3.0 packs/day for 59.0 years (177.0 ttl pk-yrs)    Types: Cigarettes    Start date: 21    Quit date: 2017    Years since quitting: 8.6   Smokeless tobacco: Never   Tobacco comments:    quit smoking cigarettes in 2015  Vaping Use   Vaping status: Never Used  Substance and Sexual Activity   Alcohol use: No    Comment: states he quit many years ago   Drug use: No    Comment: previous h/o cocaine use, denies since 2006   Sexual activity: Not Currently    Physical Exam: Blood pressure 132/80, pulse 82, temperature 97.9 F (36.6 C), temperature source Oral, height 5' 6 (1.676 m), weight 149 lb (67.6 kg), SpO2 94%.  Gen: in wheelchair, elderly, frail Lungs: diminished no wheeze CV: tachycardic, regular, systolic murmur  Data Reviewed: Imaging: CT Angio from June 2023 - severe emphysema, 4mm pulmonary nodule in RML   PFTs:  Pulmonary function testing sept 2021 demonstrates very severe airflow limitation with an FEV1 of 34% and positive BD response. severely reduced DLCO and lung volumes show hyperinflation consistent with emphysema.  Immunization status: Immunization History  Administered Date(s) Administered   Fluad  Quad(high Dose 65+) 05/29/2019, 06/18/2020   INFLUENZA, HIGH DOSE SEASONAL PF 07/27/2018   Moderna Sars-Covid-2 Vaccination 11/28/2019, 12/26/2019   Pneumococcal Conjugate-13 12/05/2018   Zoster Recombinant(Shingrix) 02/09/2018, 12/05/2018    Assessment:  Severe centrilobular emphysema FEV1 less 35%, progression of disease 155-pack-year smoking history, quit 2015, stable Status post aortic valve replacement, now with recurrent aortic stenosis. Coronary artery disease status post CABG Solitary pulmonary nodule, need for lung cancer screening Seasonal allergic rhinitis Chronic respiratory failure on 3LNC  Plan/Recommendations: He is currently taking Breztri , Symbicort , albuterol , DuoNeb at will and more than the scheduled recommended dose.  I have counseled him on the risks of taking inhaler therapy outside the  prescribed recommendation. We discussed and reviewed extensively of the hazards of taking his inhalers more frequently than prescribed or recommended including cardiac arrhythmia sudden cardiac death urinary retention eye changes cough, thrush.  He has previously expressed desire desire to be DNR and DNI.  However he is resistant to any kind of outpatient palliative care or hospice services.  The most important thing to him is that he is able to pick up his inhalers whenever he wants to even if he is using the more than prescribed.  I have refilled his albuterol  so that he can have it every 16 days.    Follow-up with Dr. Charlott for aortic aneurysm.  He is overdue for scan.  This is also comprehensive and covering for his low-dose CT for lung cancer screening.  Ambulatory desaturation study today for POC qualification. Qualified for POC see separate documentation  I spent 30 minutes in the care of this patient today including pre-charting, chart review, review of results, face-to-face care, coordination of care and communication with consultants etc.).   Return to Care: Return in  about 3 months (around 09/11/2024).    Verdon Gore, MD Pulmonary and Critical Care Medicine [facility] [phone]

## 2024-06-12 NOTE — Patient Instructions (Addendum)
 It was a pleasure to see you today!  Please schedule follow up with myself in 3 months.  If my schedule is not open yet, we will contact you with a reminder closer to that time. Please call [phone] if you haven't heard from us  a month before, and always call us  sooner if issues or concerns arise. You can also send us  a message through MyChart, but but aware that this is not to be used for urgent issues and it may take up to 5-7 days to receive a reply. Please be aware that you will likely be able to view your results before I have a chance to respond to them. Please give us  5 business days to respond to any non-urgent results.    Continue Breztri .  I recommend 2 puffs twice daily, gargle after use Continue albuterol  inhaler which I recommend taking a more than 6 times a day as needed for symptoms of chest tightness, wheezing, shortness of breath.  Continue home oxygen therapy to keep your oxygen saturations over 88%.  You are overdue for a follow-up scan with Dr. Charlott for your aortic aneurysm.  I will reach out to his office to have them schedule this.

## 2024-06-21 ENCOUNTER — Telehealth: Payer: Self-pay

## 2024-06-21 DIAGNOSIS — J9611 Chronic respiratory failure with hypoxia: Secondary | ICD-10-CM

## 2024-06-21 NOTE — Telephone Encounter (Signed)
 Copied from CRM [id number]. Topic: Clinical - Order For Equipment >> Jun 20, 2024 10:17 AM [name] D wrote: Reason for CRM: [name] with [employer] calling because they are trying to get patient transferred to [facility] for his oxygen. They are needing a copy of the oxygen evaluation in order to process his oxygen order through [facility].   Fax#: [phone] Phone#: [phone]  Called and spoke with the patient, pt states he would not like to switch DME companies and would like to stay with [facility]. Nothing further needed.

## 2024-06-24 ENCOUNTER — Other Ambulatory Visit: Payer: Self-pay

## 2024-06-25 ENCOUNTER — Other Ambulatory Visit: Payer: Self-pay | Admitting: Internal Medicine

## 2024-06-25 MED ORDER — LISINOPRIL 5 MG PO TABS: 5.0000 mg | ORAL_TABLET | Freq: Every day | ORAL | 0 refills | Status: DC

## 2024-06-25 MED ORDER — ROSUVASTATIN CALCIUM 5 MG PO TABS: 5.0000 mg | ORAL_TABLET | Freq: Every day | ORAL | 0 refills | Status: DC

## 2024-06-26 ENCOUNTER — Ambulatory Visit
Admission: RE | Admit: 2024-06-26 | Discharge: 2024-06-26 | Disposition: A | Discharge status: Home / Self Care | Source: Ambulatory Visit | Attending: Thoracic Surgery (Cardiothoracic Vascular Surgery) | Admitting: Thoracic Surgery (Cardiothoracic Vascular Surgery)

## 2024-06-26 DIAGNOSIS — I7121 Aneurysm of the ascending aorta, without rupture: Secondary | ICD-10-CM

## 2024-06-27 ENCOUNTER — Telehealth: Payer: Self-pay | Admitting: Internal Medicine

## 2024-06-27 NOTE — Telephone Encounter (Signed)
 OV notes printed as well as walk test and faxed to Ronal Hun at [employer] [phone].  Received confirmation fax that fax was sent successfully.  Nothing further needed.

## 2024-06-27 NOTE — Telephone Encounter (Signed)
 Copied from CRM #[id number]. Topic: General - Other >> Jun 26, 2024  5:08 PM Rilla B wrote: Reason for CRM: MaryBeth from [employer] oxygen calling for face to face notes on 06/12/2024 and also oxygen evaluation. QJK [phone]   ----------------------------------------------------------------------- From previous Reason for Contact - Lab/Test Results: Reason for CRM:  MaryBeth from [employer] oxygen calling for face to face notes on 06/12/2024 and also oxygen evaluation. FAX [phone]

## 2024-07-12 ENCOUNTER — Telehealth: Payer: Self-pay

## 2024-07-12 NOTE — Telephone Encounter (Signed)
 Copied from CRM [id number]. Topic: Clinical - Order For Equipment >> Jul 09, 2024 12:02 PM Nathanel DEL wrote: Reason for CRM: harlene w/ [employer] faxed order for a POC yesterday 10/06.   She would like to confirm you received fax.  Pls call [phone] and let her know so she can refax if you did not receive.   That phone number is her direct line and VM.     Spoke w/ harlene IDA we have the paperwork &  that provider returns to clinic on Monday the 13th    -NFN

## 2024-07-17 ENCOUNTER — Ambulatory Visit

## 2024-07-24 ENCOUNTER — Other Ambulatory Visit: Payer: Self-pay | Admitting: Internal Medicine

## 2024-07-25 NOTE — Telephone Encounter (Signed)
 I verified with Leonor up front that we have not received a fax from [employer].

## 2024-07-25 NOTE — Telephone Encounter (Signed)
 jessica from [employer] said they told me that the doctor was going to be leaving at the end of the month really concern about this . need this urgent just in case of some error and needihng the doctor to fix it before she leaves . The order has not been sent back yet and needing to get this done before the provider leaves . Provider leaves at the end of December . Spoke with ms channing and the next time she will be in is November 4th .  And was told to send a message clinical . [name] needs this order sent back and can't be signed by no one else but dr meade . And I did relay that doctor is not leaving till the end of December . Please reach out to ms panama concerning getting this order signed and sent back This is very urgent for the patients oxygen . We can not deliver the oxygen without the order being signed and sent back .SABRA Its fine if the doctor hand signs and date or electronically signs and date with electronic audit trail  [phone] [name]  direct line and secure voicemail  Fax number [phone]

## 2024-07-31 ENCOUNTER — Ambulatory Visit

## 2024-08-01 NOTE — Telephone Encounter (Signed)
 Received fax from [employer] for oxygen.  After reaching a busy signal for several days, the fax finally went through.  Received confirmation fax that it ws sent successfully.  Original sent to scan and copy placed in Desai's box.  Nothing further needed.

## 2024-08-06 NOTE — Progress Notes (Unsigned)
 [address] [place] [address]             [phone]            [name] [name] [id number] September 28, 1946   History of Present Illness:      Current Outpatient Medications on File Prior to Visit  Medication Sig Dispense Refill   acetaminophen  (TYLENOL ) 500 MG tablet Take 1,000 mg by mouth in the morning, at noon, in the evening, and at bedtime.     albuterol  (PROVENTIL ) (2.5 MG/3ML) 0.083% nebulizer solution Take 3 mLs (2.5 mg total) by nebulization every 6 (six) hours as needed for wheezing or shortness of breath. 75 mL 12   albuterol  (VENTOLIN  HFA) 108 (90 Base) MCG/ACT inhaler 2 puffs every 4 hours as needed. Allow for patient to fill every 16 days. 8 g 11   amLODipine  (NORVASC ) 2.5 MG tablet Take 1 tablet (2.5 mg total) by mouth daily. 90 tablet 3   budesonide -formoterol  (SYMBICORT ) 160-4.5 MCG/ACT inhaler Inhale 2 puffs into the lungs 2 (two) times daily. 10.2 g 11   COMBIVENT RESPIMAT 20-100 MCG/ACT AERS respimat Inhale 1 puff into the lungs 2 (two) times daily as needed.     dextromethorphan-guaiFENesin (MUCINEX DM) 30-600 MG per 12 hr tablet Take 1 tablet by mouth 4 (four) times daily.     Evolocumab  (REPATHA ) 140 MG/ML SOSY Inject 140 mg into the skin every 14 (fourteen) days. 6 mL 3   fenofibrate  (TRICOR ) 145 MG tablet Take 1 tablet (145 mg total) by mouth daily. 90 tablet 3   fluticasone  (FLONASE ) 50 MCG/ACT nasal spray Place 1 spray into both nostrils daily. 16 g 5   furosemide  (LASIX ) 40 MG tablet Take 80mg  - 2 tablets by mouth every other day alternating with 40mg  - 1 tablet by mouth 135 tablet 1   ipratropium-albuterol  (DUONEB) 0.5-2.5 (3) MG/3ML SOLN Take 3 mLs by nebulization every 6 (six) hours as needed. 360 mL 3   lisinopril  (ZESTRIL ) 5 MG tablet Take 1 tablet (5 mg total) by mouth daily. 15 tablet 0   Multiple Vitamin (MULTIVITAMIN WITH MINERALS) TABS tablet Take 1 tablet by mouth every other day. Alive     potassium chloride   (KLOR-CON  M) 10 MEQ tablet TAKE 2 TABLETS BY MOUTH ON DAYS THAT YOU TAKE 2 LASIX  TABLETS AND 1 TABLET ON DAYS THAT YOU TAKE 1 LASIX  TABLET 135 tablet 2   rosuvastatin  (CRESTOR ) 5 MG tablet TAKE 1 TABLET(5 MG) BY MOUTH DAILY 15 tablet 0   trolamine salicylate (ASPERCREME) 10 % cream Apply 1 application. topically as needed (Joint pain).     No current facility-administered medications on file prior to visit.     ROS:   There were no vitals taken for this visit.    Imaging: CLINICAL DATA:  Aortic aneurysm suspected.   EXAM: CT CHEST WITHOUT CONTRAST   TECHNIQUE: Multidetector CT imaging of the chest was performed following the standard protocol without IV contrast.   RADIATION DOSE REDUCTION: This exam was performed according to the departmental dose-optimization program which includes automated exposure control, adjustment of the mA and/or kV according to patient size and/or use of iterative reconstruction technique.   COMPARISON:  02/06/2023.   FINDINGS: Cardiovascular: Atherosclerotic calcification of the aorta and coronary arteries. Aortic valve replacement. Ascending aorta measures up to approximately 4.8 cm proximally (coronal image 75), stable. Heart is at the upper limits of normal in size to mildly enlarged.  No pericardial effusion.   Mediastinum/Nodes: No pathologically enlarged mediastinal or axillary lymph nodes. Hilar regions are difficult to definitively evaluate without IV contrast. Esophagus is grossly unremarkable.   Lungs/Pleura: Centrilobular and paraseptal emphysema. Scattered pulmonary parenchymal scarring. 4 mm lateral segment right middle lobe nodule (8/101), unchanged. 4.7 mm posterior right lower lobe nodule (8/116), new from 02/06/2023. 4.6 mm posterior left lower lobe nodule (8/117), also new. Calcified granulomas. No pleural fluid. Airway is unremarkable.   Upper Abdomen: Visualized portions of the liver, adrenal glands, kidneys, spleen,  pancreas, stomach and bowel are grossly unremarkable. No upper abdominal adenopathy.   Musculoskeletal: Degenerative changes in the spine. Osteopenia. Minimal retrolisthesis of L1 on L2.   IMPRESSION: 1. 4.8 cm proximal ascending aortic aneurysm, stable when measured just above the aortic root. Ascending thoracic aortic aneurysm. Recommend semi-annual imaging followup by CTA or MRA and referral to cardiothoracic surgery if not already obtained. This recommendation follows 2010 ACCF/AHA/AATS/ACR/ASA/SCA/SCAI/SIR/STS/SVM Guidelines for the Diagnosis and Management of Patients With Thoracic Aortic Disease. Circulation. 2010; 121: Z733-z630. Aortic aneurysm NOS (ICD10-I71.9). 2. New bilateral lower lobe pulmonary nodules. Given smoking history, follow-up CT chest without contrast in 6 months is recommended. 3. Low-dose CT lung cancer screening is recommended for patients who are 76-76 years of age with a 20+ pack-year history of smoking and who are currently smoking or quit <=15 years ago. 4. Aortic atherosclerosis (ICD10-I70.0). Coronary artery calcification. 5.  Emphysema (ICD10-J43.9).     Electronically Signed   By: Newell Eke M.D.   On: 06/26/2024 12:49       A/P:      Risk Modification:  Statin:  ***  Smoking cessation instruction/counseling given:  {CHL AMB [facility] SMOKING CESSATION COUNSELING:20758}  Patient was counseled on importance of Blood Pressure Control  They are instructed to contact their Primary Care Physician if they start to have blood pressure readings over 130s/90s. Do not ever stop blood pressure medications on your own, unless instructed by healthcare professional.  Please avoid use of Fluoroquinolones as this can potentially increase your risk of Aortic Rupture and/or Dissection  Patient educated on signs and symptoms of Aortic Dissection, handout also provided in AVS  Manuelita CHRISTELLA Rough, PA-C 08/06/24

## 2024-08-07 ENCOUNTER — Ambulatory Visit

## 2024-08-07 VITALS — BP 126/80 | HR 82 | Resp 18 | Ht 66.0 in | Wt 152.0 lb

## 2024-08-07 DIAGNOSIS — R911 Solitary pulmonary nodule: Secondary | ICD-10-CM

## 2024-08-07 DIAGNOSIS — I7121 Aneurysm of the ascending aorta, without rupture: Secondary | ICD-10-CM | POA: Diagnosis not present

## 2024-08-07 NOTE — Patient Instructions (Addendum)
 Risk Modification in those with ascending thoracic aortic aneurysm:   Continue control of blood pressure (prefer BP 130/80 or less)   2. Avoid fluoroquinolone antibiotics (I.e Ciprofloxacin, Avelox, Levofloxacin, Ofloxacin)   3.  Use of statin (to decrease cardiovascular risk)   4.  Exercise and activity limitations is individualized, but in general, contact sports are to be avoided and one should avoid heavy lifting (defined as half of ideal body weight) and exercises involving sustained Valsalva maneuver.   5.  Follow-up in 6 months with CT chest.

## 2024-08-22 ENCOUNTER — Inpatient Hospital Stay (HOSPITAL_COMMUNITY)
Admission: EM | Admit: 2024-08-22 | Discharge: 2024-08-22 | DRG: 291 | Discharge status: Against Medical Advice | Source: Ambulatory Visit | Attending: Emergency Medicine | Admitting: Emergency Medicine

## 2024-08-22 ENCOUNTER — Encounter (HOSPITAL_COMMUNITY): Payer: Self-pay

## 2024-08-22 ENCOUNTER — Inpatient Hospital Stay (HOSPITAL_COMMUNITY)

## 2024-08-22 ENCOUNTER — Emergency Department (HOSPITAL_COMMUNITY)

## 2024-08-22 DIAGNOSIS — Z91148 Patient's other noncompliance with medication regimen for other reason: Secondary | ICD-10-CM | POA: Diagnosis not present

## 2024-08-22 DIAGNOSIS — I350 Nonrheumatic aortic (valve) stenosis: Secondary | ICD-10-CM | POA: Diagnosis present

## 2024-08-22 DIAGNOSIS — Z9981 Dependence on supplemental oxygen: Secondary | ICD-10-CM | POA: Diagnosis not present

## 2024-08-22 DIAGNOSIS — Z66 Do not resuscitate: Secondary | ICD-10-CM | POA: Diagnosis present

## 2024-08-22 DIAGNOSIS — E785 Hyperlipidemia, unspecified: Secondary | ICD-10-CM | POA: Diagnosis present

## 2024-08-22 DIAGNOSIS — Z8249 Family history of ischemic heart disease and other diseases of the circulatory system: Secondary | ICD-10-CM | POA: Diagnosis not present

## 2024-08-22 DIAGNOSIS — Z825 Family history of asthma and other chronic lower respiratory diseases: Secondary | ICD-10-CM | POA: Diagnosis not present

## 2024-08-22 DIAGNOSIS — Z79899 Other long term (current) drug therapy: Secondary | ICD-10-CM | POA: Diagnosis not present

## 2024-08-22 DIAGNOSIS — I25810 Atherosclerosis of coronary artery bypass graft(s) without angina pectoris: Secondary | ICD-10-CM | POA: Diagnosis present

## 2024-08-22 DIAGNOSIS — Z808 Family history of malignant neoplasm of other organs or systems: Secondary | ICD-10-CM | POA: Diagnosis not present

## 2024-08-22 DIAGNOSIS — Z952 Presence of prosthetic heart valve: Secondary | ICD-10-CM | POA: Diagnosis not present

## 2024-08-22 DIAGNOSIS — I509 Heart failure, unspecified: Principal | ICD-10-CM

## 2024-08-22 DIAGNOSIS — I110 Hypertensive heart disease with heart failure: Secondary | ICD-10-CM | POA: Diagnosis present

## 2024-08-22 DIAGNOSIS — Z7951 Long term (current) use of inhaled steroids: Secondary | ICD-10-CM | POA: Diagnosis not present

## 2024-08-22 DIAGNOSIS — I2510 Atherosclerotic heart disease of native coronary artery without angina pectoris: Secondary | ICD-10-CM | POA: Diagnosis present

## 2024-08-22 DIAGNOSIS — I4891 Unspecified atrial fibrillation: Secondary | ICD-10-CM | POA: Diagnosis present

## 2024-08-22 DIAGNOSIS — J9601 Acute respiratory failure with hypoxia: Secondary | ICD-10-CM | POA: Diagnosis present

## 2024-08-22 DIAGNOSIS — I429 Cardiomyopathy, unspecified: Secondary | ICD-10-CM | POA: Diagnosis present

## 2024-08-22 DIAGNOSIS — I5043 Acute on chronic combined systolic (congestive) and diastolic (congestive) heart failure: Secondary | ICD-10-CM | POA: Diagnosis present

## 2024-08-22 DIAGNOSIS — Z5329 Procedure and treatment not carried out because of patient's decision for other reasons: Secondary | ICD-10-CM | POA: Diagnosis present

## 2024-08-22 DIAGNOSIS — J441 Chronic obstructive pulmonary disease with (acute) exacerbation: Secondary | ICD-10-CM | POA: Diagnosis present

## 2024-08-22 DIAGNOSIS — K219 Gastro-esophageal reflux disease without esophagitis: Secondary | ICD-10-CM | POA: Diagnosis present

## 2024-08-22 DIAGNOSIS — Z87891 Personal history of nicotine dependence: Secondary | ICD-10-CM | POA: Diagnosis not present

## 2024-08-22 DIAGNOSIS — Z888 Allergy status to other drugs, medicaments and biological substances status: Secondary | ICD-10-CM | POA: Diagnosis not present

## 2024-08-22 LAB — RESP PANEL BY RT-PCR (RSV, FLU A&B, COVID)  RVPGX2
Influenza A by PCR: NEGATIVE
Influenza B by PCR: NEGATIVE
Resp Syncytial Virus by PCR: NEGATIVE
SARS Coronavirus 2 by RT PCR: NEGATIVE

## 2024-08-22 LAB — CBC
HCT: 44.4 % (ref 39.0–52.0)
Hemoglobin: 14.4 g/dL (ref 13.0–17.0)
MCH: 32.7 pg (ref 26.0–34.0)
MCHC: 32.4 g/dL (ref 30.0–36.0)
MCV: 100.9 fL — ABNORMAL HIGH (ref 80.0–100.0)
Platelets: 186 K/uL (ref 150–400)
RBC: 4.4 MIL/uL (ref 4.22–5.81)
RDW: 13.4 % (ref 11.5–15.5)
WBC: 6.6 K/uL (ref 4.0–10.5)
nRBC: 0 % (ref 0.0–0.2)

## 2024-08-22 LAB — BLOOD GAS, VENOUS
Acid-Base Excess: 6.3 mmol/L — ABNORMAL HIGH (ref 0.0–2.0)
Bicarbonate: 33.5 mmol/L — ABNORMAL HIGH (ref 20.0–28.0)
O2 Saturation: 50.5 %
Patient temperature: 37
pCO2, Ven: 58 mmHg (ref 44–60)
pH, Ven: 7.37 (ref 7.25–7.43)
pO2, Ven: 34 mmHg (ref 32–45)

## 2024-08-22 LAB — ECHOCARDIOGRAM COMPLETE
AR max vel: 1.01 cm2
AV Area VTI: 0.68 cm2
AV Area mean vel: 0.98 cm2
AV Mean grad: 34 mmHg
AV Peak grad: 60.5 mmHg
Ao pk vel: 3.89 m/s
Est EF: 15
S' Lateral: 5.8 cm

## 2024-08-22 LAB — TROPONIN T, HIGH SENSITIVITY
Troponin T High Sensitivity: 102 ng/L (ref 0–19)
Troponin T High Sensitivity: 97 ng/L — ABNORMAL HIGH (ref 0–19)

## 2024-08-22 LAB — HEPATIC FUNCTION PANEL
ALT: 25 U/L (ref 0–44)
AST: 34 U/L (ref 15–41)
Albumin: 4.4 g/dL (ref 3.5–5.0)
Alkaline Phosphatase: 25 U/L — ABNORMAL LOW (ref 38–126)
Bilirubin, Direct: 0.2 mg/dL (ref 0.0–0.2)
Indirect Bilirubin: 0.4 mg/dL (ref 0.3–0.9)
Total Bilirubin: 0.7 mg/dL (ref 0.0–1.2)
Total Protein: 7 g/dL (ref 6.5–8.1)

## 2024-08-22 LAB — PRO BRAIN NATRIURETIC PEPTIDE: Pro Brain Natriuretic Peptide: 2270 pg/mL — ABNORMAL HIGH (ref <300.0)

## 2024-08-22 LAB — BASIC METABOLIC PANEL WITH GFR
Anion gap: 10 (ref 5–15)
BUN: 15 mg/dL (ref 8–23)
CO2: 29 mmol/L (ref 22–32)
Calcium: 9.7 mg/dL (ref 8.9–10.3)
Chloride: 104 mmol/L (ref 98–111)
Creatinine, Ser: 0.92 mg/dL (ref 0.61–1.24)
GFR, Estimated: >60 mL/min (ref >60)
Glucose, Bld: 117 mg/dL — ABNORMAL HIGH (ref 70–99)
Potassium: 4.9 mmol/L (ref 3.5–5.1)
Sodium: 143 mmol/L (ref 135–145)

## 2024-08-22 LAB — HIV ANTIBODY (ROUTINE TESTING W REFLEX): HIV Screen 4th Generation wRfx: NONREACTIVE

## 2024-08-22 MED ORDER — HYDROMORPHONE HCL 1 MG/ML IJ SOLN
0.5000 mg | INTRAMUSCULAR | Status: DC | PRN
  Administered 2024-08-22: 0.5 mg via INTRAVENOUS
  Filled 2024-08-22: qty 1

## 2024-08-22 MED ORDER — ENOXAPARIN SODIUM 40 MG/0.4ML IJ SOSY: 40.0000 mg | PREFILLED_SYRINGE | INTRAMUSCULAR | Status: DC

## 2024-08-22 MED ORDER — LEVALBUTEROL HCL 1.25 MG/0.5ML IN NEBU: 1.2500 mg | INHALATION_SOLUTION | Freq: Four times a day (QID) | RESPIRATORY_TRACT | Status: DC | PRN

## 2024-08-22 MED ORDER — ROSUVASTATIN CALCIUM 5 MG PO TABS
5.0000 mg | ORAL_TABLET | Freq: Every day | ORAL | Status: DC
  Filled 2024-08-22: qty 1

## 2024-08-22 MED ORDER — METOPROLOL TARTRATE 5 MG/5ML IV SOLN
5.0000 mg | INTRAVENOUS | Status: DC | PRN
  Administered 2024-08-22: 5 mg via INTRAVENOUS
  Filled 2024-08-22: qty 5

## 2024-08-22 MED ORDER — FLUTICASONE FUROATE-VILANTEROL 200-25 MCG/ACT IN AEPB: 1.0000 | INHALATION_SPRAY | Freq: Every day | RESPIRATORY_TRACT | Status: DC

## 2024-08-22 MED ORDER — LISINOPRIL 10 MG PO TABS: 20.0000 mg | ORAL_TABLET | Freq: Every day | ORAL | Status: DC

## 2024-08-22 MED ORDER — METHYLPREDNISOLONE SODIUM SUCC 40 MG IJ SOLR: 40.0000 mg | Freq: Two times a day (BID) | INTRAMUSCULAR | Status: DC

## 2024-08-22 MED ORDER — PERFLUTREN LIPID MICROSPHERE: 1.0000 mL | INTRAVENOUS | Status: AC | PRN

## 2024-08-22 MED ORDER — SODIUM CHLORIDE 0.9% FLUSH
3.0000 mL | INTRAVENOUS | Status: DC | PRN
Start: 2024-08-22 — End: 2024-08-23

## 2024-08-22 MED ORDER — IPRATROPIUM-ALBUTEROL 0.5-2.5 (3) MG/3ML IN SOLN
3.0000 mL | Freq: Once | RESPIRATORY_TRACT | Status: AC
  Administered 2024-08-22: 3 mL via RESPIRATORY_TRACT
  Filled 2024-08-22: qty 3

## 2024-08-22 MED ORDER — FENOFIBRATE 160 MG PO TABS
160.0000 mg | ORAL_TABLET | Freq: Every day | ORAL | Status: DC
  Filled 2024-08-22: qty 1

## 2024-08-22 MED ORDER — ONDANSETRON HCL 4 MG/2ML IJ SOLN: 4.0000 mg | Freq: Four times a day (QID) | INTRAMUSCULAR | Status: DC | PRN

## 2024-08-22 MED ORDER — DIGOXIN 0.25 MG/ML IJ SOLN
0.1250 mg | Freq: Four times a day (QID) | INTRAMUSCULAR | Status: DC
  Administered 2024-08-22: 0.125 mg via INTRAVENOUS
  Filled 2024-08-22 (×2): qty 0.5

## 2024-08-22 MED ORDER — SODIUM CHLORIDE 0.9 % IV SOLN
250.0000 mL | INTRAVENOUS | Status: DC | PRN
Start: 2024-08-22 — End: 2024-08-23

## 2024-08-22 MED ORDER — METHYLPREDNISOLONE SODIUM SUCC 125 MG IJ SOLR
125.0000 mg | Freq: Once | INTRAMUSCULAR | Status: AC
  Administered 2024-08-22: 125 mg via INTRAVENOUS
  Filled 2024-08-22: qty 2

## 2024-08-22 MED ORDER — ASPIRIN 81 MG PO TBEC: 81.0000 mg | DELAYED_RELEASE_TABLET | Freq: Every day | ORAL | Status: DC

## 2024-08-22 MED ORDER — ACETAMINOPHEN 325 MG PO TABS: 650.0000 mg | ORAL_TABLET | ORAL | Status: DC | PRN

## 2024-08-22 MED ORDER — BUDESON-GLYCOPYRROL-FORMOTEROL 160-9-4.8 MCG/ACT IN AERO: 1.0000 | INHALATION_SPRAY | Freq: Two times a day (BID) | RESPIRATORY_TRACT | Status: DC

## 2024-08-22 MED ORDER — FUROSEMIDE 10 MG/ML IJ SOLN
80.0000 mg | Freq: Once | INTRAMUSCULAR | Status: AC
  Administered 2024-08-22: 80 mg via INTRAVENOUS
  Filled 2024-08-22: qty 8

## 2024-08-22 MED ORDER — IPRATROPIUM BROMIDE 0.02 % IN SOLN
0.5000 mg | Freq: Four times a day (QID) | RESPIRATORY_TRACT | Status: DC
  Administered 2024-08-22 (×2): 0.5 mg via RESPIRATORY_TRACT
  Filled 2024-08-22: qty 2.5

## 2024-08-22 MED ORDER — FUROSEMIDE 10 MG/ML IJ SOLN: 40.0000 mg | Freq: Two times a day (BID) | INTRAMUSCULAR | Status: DC

## 2024-08-22 MED ORDER — GUAIFENESIN ER 600 MG PO TB12: 600.0000 mg | ORAL_TABLET | Freq: Two times a day (BID) | ORAL | Status: DC

## 2024-08-22 MED ORDER — SODIUM CHLORIDE 0.9% FLUSH
3.0000 mL | Freq: Two times a day (BID) | INTRAVENOUS | Status: DC
  Administered 2024-08-22: 3 mL via INTRAVENOUS

## 2024-08-22 NOTE — Progress Notes (Signed)
 Patient placed back on BiPAP at this time due to increase WOB. RN at bedside.

## 2024-08-22 NOTE — H&P (Signed)
 History and Physical    SABIN GIBEAULT FMW:[id number] DOB: [birth year] DOA: 08/22/2024  PCP: Shelda Atlas, MD (Confirm with patient/family/NH records and if not entered, this has to be entered at [facility] point of entry) Patient coming from: Home  I have personally briefly reviewed patient's old medical records in [facility]  Chief Complaint: Cough, SOB, ankle swelling  HPI: [name] [name] is a 78 y.o. male with medical history significant of aortic stenosis status post biosynthetic aortic valve replacement now with recurrent moderate to severe aortic stenosis, COPD, CAD status post CABG, HTN, brought in by family member for evaluation of worsening cough shortness of breath and leg swelling.  Patient reported that he stopped taking Lasix  several months ago Do not like to go to bathroom when taking the pill but he reported that he has been compliant with all other CHF/BP medications.  Sister at bedside reported for at least 2 to 3 weeks patient has been having cough wheezing and increasing exertional dyspnea.  Cough has been dry cough, denies any chest pain no fever or chills.  Meantime he started noticed increasing bilateral ankle edema and this week even walking inside his own room caused significant shortness of breath.  Quit smoking 5 years ago.  ED Course: Tachycardia, blood pressure 115/96 oxygenation 99% on BiPAP.  Checks x-ray showed mild pulmonary congestion, blood work showed BUN 15 creatinine 0.9 VBG 7.30 7/58/34.  Patient was placed on BiPAP and reported somewhat improvement of shortness of breath.  Telemonitoring shows A-fib with RVR, EKG showed A-fib with RVR.  Troponin pending.  Patient was given IV Lasix , IV Solu-Medrol  breathing treatment in the ED.  Review of Systems: As per HPI otherwise 14 point review of systems negative.    Past Medical History:  Diagnosis Date   Aortic stenosis    s/p bioprosthetic valve replacement   Arthritis    Ascending aortic aneurysm     CAD (coronary artery disease) of bypass graft    s/p SVG to dRCA   Cardiomyopathy 2006   EF 30%; has history of CHF   CHF (congestive heart failure) (HCC)    COPD (chronic obstructive pulmonary disease) (HCC)    FEV1/FVC -> 0.52 (69% of predicted)   Dyspnea    HX: COPD   GERD (gastroesophageal reflux disease)    Heart murmur    Hypertension    Lesion of buccal mucosa    right, with non-restoable abscessed teeth   Osteoarthritis    Tobacco abuse    Wears glasses     Past Surgical History:  Procedure Laterality Date   AORTIC VALVE REPLACEMENT     Bovine valve   AORTIC VALVE REPLACEMENT     bioprosthetic, 01/2006 - cannot find record of surgery (has scar)   COLONOSCOPY     COLONOSCOPY N/A 01/01/2017   Procedure: COLONOSCOPY;  Surgeon: Lynwood Bohr, MD;  Location: [facility] ENDOSCOPY;  Service: Endoscopy;  Laterality: N/A;   ESOPHAGOGASTRODUODENOSCOPY N/A 01/01/2017   Procedure: ESOPHAGOGASTRODUODENOSCOPY (EGD);  Surgeon: Lynwood Bohr, MD;  Location: [facility] ENDOSCOPY;  Service: Endoscopy;  Laterality: N/A;   MULTIPLE TOOTH EXTRACTIONS     RIGHT HEART CATH AND CORONARY ANGIOGRAPHY N/A 02/25/2022   Procedure: RIGHT HEART CATH AND CORONARY ANGIOGRAPHY;  Surgeon: Verlin Lonni BIRCH, MD;  Location: [facility] INVASIVE CV LAB;  Service: Cardiovascular;  Laterality: N/A;   TONSILLECTOMY     TOOTH EXTRACTION Bilateral 07/31/2020   Procedure: DENTAL RESTORATION/EXTRACTIONS;  Surgeon: Sheryle Hamilton, DDS;  Location: [facility] OR;  Service: Oral  Surgery;  Laterality: Bilateral;     reports that he quit smoking about 8 years ago. His smoking use included cigarettes. He started smoking about 67 years ago. He has a 177 pack-year smoking history. He has never used smokeless tobacco. He reports that he does not drink alcohol and does not use drugs.  Allergies  Allergen Reactions   Atenolol  Shortness Of Breath    Family History  Problem Relation Age of Onset   Cancer Mother        uterine cancer   Emphysema Mother     Heart failure Father    Coronary artery disease Father    Emphysema Father    Emphysema Sister    Cancer Brother        neck/throat cancer   Emphysema Brother     Prior to Admission medications   Medication Sig Start Date End Date Taking? Authorizing Provider  acetaminophen  (TYLENOL ) 500 MG tablet Take 1,000 mg by mouth in the morning, at noon, in the evening, and at bedtime.   Yes [provider]  albuterol  (PROVENTIL ) (2.5 MG/3ML) 0.083% nebulizer solution Take 3 mLs (2.5 mg total) by nebulization every 6 (six) hours as needed for wheezing or shortness of breath. 04/17/23  Yes Meade Verdon RAMAN, MD  albuterol  (VENTOLIN  HFA) 108 [phone] Base) MCG/ACT inhaler 2 puffs every 4 hours as needed. Allow for patient to fill every 16 days. 06/11/24  Yes Meade Verdon RAMAN, MD  BREZTRI  AEROSPHERE 160-9-4.8 MCG/ACT AERO inhaler Inhale 1 puff into the lungs 2 (two) times daily. 07/15/24  Yes [provider]  budesonide -formoterol  (SYMBICORT ) 160-4.5 MCG/ACT inhaler Inhale 2 puffs into the lungs 2 (two) times daily. 01/03/24  Yes Meade Verdon RAMAN, MD  COMBIVENT RESPIMAT 20-100 MCG/ACT AERS respimat Inhale 1 puff into the lungs 2 (two) times daily as needed. 09/07/22  Yes [provider]  fenofibrate  (TRICOR ) 145 MG tablet Take 1 tablet (145 mg total) by mouth daily. 08/02/23  Yes Okey Vina GAILS, MD  furosemide  (LASIX ) 40 MG tablet Take 80mg  - 2 tablets by mouth every other day alternating with 40mg  - 1 tablet by mouth 02/09/22  Yes Okey Vina GAILS, MD  lisinopril  (ZESTRIL ) 10 MG tablet Take 10 mg by mouth daily. Patient taking differently: Take 20 mg by mouth daily. 06/26/24  Yes [provider]  Multiple Vitamin (MULTIVITAMIN WITH MINERALS) TABS tablet Take 1 tablet by mouth every other day. Alive   Yes [provider]  potassium chloride  (KLOR-CON  M) 10 MEQ tablet TAKE 2 TABLETS BY MOUTH ON DAYS THAT YOU TAKE 2 LASIX  TABLETS AND 1 TABLET ON DAYS THAT YOU TAKE 1 LASIX   TABLET Patient taking differently: Take 10 mEq by mouth See admin instructions. TAKE 2 TABLETS BY MOUTH ON DAYS THAT YOU TAKE 2 LASIX  TABLETS AND 1 TABLET ON DAYS THAT YOU TAKE 1 LASIX  TABLET 12/13/22  Yes Okey Vina GAILS, MD  rosuvastatin  (CRESTOR ) 5 MG tablet TAKE 1 TABLET(5 MG) BY MOUTH DAILY Patient taking differently: Take 5 mg by mouth daily. 06/25/24  Yes Okey Vina GAILS, MD  amLODipine  (NORVASC ) 2.5 MG tablet Take 1 tablet (2.5 mg total) by mouth daily. Patient not taking: Reported on 08/22/2024 06/21/23   Williams, Evan, PA-C  dextromethorphan-guaiFENesin (MUCINEX DM) 30-600 MG per 12 hr tablet Take 1 tablet by mouth 4 (four) times daily. Patient not taking: Reported on 08/22/2024    [provider]  Evolocumab  (REPATHA ) 140 MG/ML SOSY Inject 140 mg into the skin every 14 (fourteen)  days. Patient not taking: Reported on 08/07/2024 08/09/23   Okey Vina GAILS, MD  fluticasone  (FLONASE ) 50 MCG/ACT nasal spray Place 1 spray into both nostrils daily. Patient not taking: Reported on 08/22/2024 12/22/22   Meade Verdon RAMAN, MD  ipratropium-albuterol  (DUONEB) 0.5-2.5 (3) MG/3ML SOLN Take 3 mLs by nebulization every 6 (six) hours as needed. Patient not taking: Reported on 08/22/2024 03/07/24   Desai, Nikita S, MD  trolamine salicylate (ASPERCREME) 10 % cream Apply 1 application. topically as needed (Joint pain). Patient not taking: Reported on 08/22/2024    [provider]    Physical Exam: Vitals:   08/22/24 1129 08/22/24 1134 08/22/24 1157  BP:  (!) 114/96   Pulse: (!) 115  (!) 115  Resp: (!) 22  17  Temp: 98.7 F (37.1 C)    TempSrc: Oral    SpO2: 98% 99% 100%    Constitutional: NAD, calm, comfortable Vitals:   08/22/24 1129 08/22/24 1134 08/22/24 1157  BP:  (!) 114/96   Pulse: (!) 115  (!) 115  Resp: (!) 22  17  Temp: 98.7 F (37.1 C)    TempSrc: Oral    SpO2: 98% 99% 100%   Eyes: PERRL, lids and conjunctivae normal ENMT: Mucous membranes are moist. Posterior pharynx  clear of any exudate or lesions.Normal dentition.  Neck: normal, supple, no masses, no thyromegaly Respiratory: Diminished breathing sound bilaterally, scattered bilateral lower field crackles, diffused wheezing, increasing breathing effort, talking in broken sentences.  Respiratory effort. No accessory muscle use.  Cardiovascular: Regular rate and rhythm, systolic murmur on heart base. 2+ extremity edema. 2+ pedal pulses. No carotid bruits.  Abdomen: no tenderness, no masses palpated. No hepatosplenomegaly. Bowel sounds positive.  Musculoskeletal: no clubbing / cyanosis. No joint deformity upper and lower extremities. Good ROM, no contractures. Normal muscle tone.  Skin: no rashes, lesions, ulcers. No induration Neurologic: CN 2-12 grossly intact. Sensation intact, DTR normal. Strength 5/5 in all 4.  Psychiatric: Normal judgment and insight. Alert and oriented x 3. Normal mood.     Labs on Admission: I have personally reviewed following labs and imaging studies  CBC: Recent Labs  Lab 08/22/24 1133  WBC 6.6  HGB 14.4  HCT 44.4  MCV 100.9*  PLT 186   Basic Metabolic Panel: Recent Labs  Lab 08/22/24 1133  NA 143  K 4.9  CL 104  CO2 29  GLUCOSE 117*  BUN 15  CREATININE 0.92  CALCIUM  9.7   GFR: CrCl cannot be calculated (Unknown ideal weight.). Liver Function Tests: Recent Labs  Lab 08/22/24 1130  AST 34  ALT 25  ALKPHOS 25*  BILITOT 0.7  PROT 7.0  ALBUMIN 4.4   No results for input(s): LIPASE, AMYLASE in the last 168 hours. No results for input(s): AMMONIA in the last 168 hours. Coagulation Profile: No results for input(s): INR, PROTIME in the last 168 hours. Cardiac Enzymes: No results for input(s): CKTOTAL, CKMB, CKMBINDEX, TROPONINI in the last 168 hours. BNP (last 3 results) Recent Labs    08/22/24 1130  PROBNP 2,270.0*   HbA1C: No results for input(s): HGBA1C in the last 72 hours. CBG: No results for input(s): GLUCAP in the last  168 hours. Lipid Profile: No results for input(s): CHOL, HDL, LDLCALC, TRIG, CHOLHDL, LDLDIRECT in the last 72 hours. Thyroid Function Tests: No results for input(s): TSH, T4TOTAL, FREET4, T3FREE, THYROIDAB in the last 72 hours. Anemia Panel: No results for input(s): VITAMINB12, FOLATE, FERRITIN, TIBC, IRON, RETICCTPCT in the last 72 hours. Urine  analysis:    Component Value Date/Time   LABSPEC 1.020 06/21/2010 1108   PHURINE 5.5 06/21/2010 1108   HGBUR negative 06/21/2010 1108   BILIRUBINUR negative 06/21/2010 1108   UROBILINOGEN 0.2 06/21/2010 1108   NITRITE negative 06/21/2010 1108    Radiological Exams on Admission: DG Chest Port 1 View Result Date: 08/22/2024 CLINICAL DATA:  Shortness of breath. EXAM: PORTABLE CHEST 1 VIEW COMPARISON:  Chest radiograph dated 04/29/2020. FINDINGS: No focal consolidation, pleural effusion or pneumothorax. Stable mild cardiomegaly. Median sternotomy wires. No acute osseous pathology. IMPRESSION: 1. No active disease. 2. Mild cardiomegaly. Electronically Signed   By: Vanetta Chou M.D.   On: 08/22/2024 13:35    EKG: Independently reviewed.  A-fib with RVR,  Assessment/Plan Principal Problem:   CHF (congestive heart failure) (HCC) Active Problems:   COPD with acute exacerbation (HCC)   Acute on chronic combined systolic and diastolic CHF (congestive heart failure) (HCC)  (please populate well all problems here in Problem List. (For example, if patient is on BP meds at home and you resume or decide to hold them, it is a problem that needs to be her. Same for CAD, COPD, HLD and so on)  Acute hypoxic respiratory failure Acute COPD exacerbation Acute on chronic combined HFrEF and HFpEF decompensation - Acute hypoxic respite failure likely multifactorial from both COPD and CHF decompensated - For COPD exacerbation, continue IV Solu-Medrol , change albuterol  to Atrovent  given that patient has rapid A-fib, and as  needed Xopenex . 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  A-fib with RVR - Given the patient has concurrent symptoms signs of CHF completion, okay 2 doses of digoxin  0.125 mg every 6 hours x 2 and as needed Lopressor  for rate control - CHADS2=3, indication for systemic anticoagulation.  Discussed the issue with patient and sister at bedside, patient however adamantly refused anticoagulation  I was on every kind of blood thinner, with bleeding everywhere - Will start patient on aspirin  and chemical DVT prophylaxis.  Medical noncompliance - Looks like patient has a history of cherry pick which medication he likes to take.  He has been avoiding diuresis because of the inconvenience.  Discussed with patient and his sister regarding importance of stay with diuresis regimen to avoid CHF decompensation and worsening of heart function.  Patient however appears to be not impressed.  Confirmed that patient is DNR/DNI.  Briefly discussed with sister regarding possibly involving palliative care if no significant improvement.  Remote history of GI bleed - Patient reported severe GI bleed in 2018 requiring blood transfusion.  Record from that year showed patient was hospitalized for severe lower GI bleed when GI workup found hemorrhoids.  Patient denied any recent hemorrhoid flareup and H&H appears to be stable compared to last year.  History of biosynthetic aortic valve replacement, with recurrent moderate to severe aortic stenosis - Check echo - Went through patient records, this issue has been followed by CT surgery who has been proposing TAVR to fix the worsening of stenosis of biosynthetic valve, patient however declined offer.    DVT prophylaxis: Lovenox  Code Status: Full code Family Communication: Sister at  bedside Disposition Plan: Patient is sick with CHF conversation complicated by new onset of A-fib with RVR requiring IV diuresis and IV rate control medications, expect more than 2 midnight hospital stay. Consults called: Cardiology Admission status:  PCU admit   Cort ONEIDA Mana MD [facility] Pager [phone]  08/22/2024, 2:29 PM

## 2024-08-22 NOTE — ED Triage Notes (Signed)
 Pt presents with c/o shortness of breath. Pt is being followed for his COPD, but family reports that he has been worsening recently and is more out of breath than normal and not able to walk a few steps without being out of breath. Pt is on oxygen all the time at home. Pt presents in triage with labored breathing, able to answer questions but is winded while talking.

## 2024-08-22 NOTE — Progress Notes (Signed)
  Echocardiogram 2D Echocardiogram has been performed.  [name] [name] 08/22/2024, 3:59 PM

## 2024-08-22 NOTE — Progress Notes (Signed)
    Against Medical Advice   [name] [name] , A/O x4, expresses desire to leave the Hospital immediately.  I was able to further discuss this decision with the patient and his sister at bedside.  I have discussed with the patient his care and reason for hospitalization, including ongoing treatment for acute hypoxic respiratory failure, acute on chronic combined HF, A-fib.  Despite this education, the patient states he feels better and plans to go home.  He understands that I recommend he remain hospitalized to continue therapy and monitoring.    Despite my attempts to convince the patient to stay, he is adamant about leaving medical care now. I have warned the patient that leaving AMA can result in medical complications like worsening respiratory failure, heart failure, uncontrolled cardiac arrhythmia, irreversible disability, and/or death death, etc.  The patient understands and accepts the risks involved and assumes full responsibilty of this decision.  Patient is agreeable to signing AMA form.  This patient has also been advised that if they feel the need for further medical assistance to return to any available ER or dial [phone].   Idalia Allbritton, DNP, ACNPC- AG [facility] [place]

## 2024-08-22 NOTE — ED Notes (Signed)
 Pt requested to leave AMA, NP made aware. E-sig signed.

## 2024-08-22 NOTE — ED Notes (Signed)
 Patient became dyspneic during bedside US . Sitting on the edge of the bed gasping for air. RT called and RN was walked through how to place patient back on Bipap. RT then came to bedside. MD notified.
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
## 2024-08-22 NOTE — ED Notes (Signed)
 Patient currently on nasal canula - refusing to be on non-rebreather or bipap. Hospitalist came to bedside.

## 2024-08-22 NOTE — Progress Notes (Signed)
   08/22/24 1157  BiPAP/CPAP/SIPAP  $ Non-Invasive Ventilator  (S)  Non-Invasive Vent Initial  $ Face Mask Medium Yes  BiPAP/CPAP/SIPAP Pt Type Adult  BiPAP/CPAP/SIPAP (S)  V60  Mask Type Full face mask  Dentures removed? Not applicable  Mask Size (S)  Medium  Set Rate (S)  12 breaths/min  Respiratory Rate 14 breaths/min  IPAP (S)  8 cmH20  EPAP (S)  5 cmH2O  FiO2 (%) (S)  36 %  Flow Rate 1.1 lpm  Minute Ventilation 9.7  Leak 31  Peak Inspiratory Pressure (PIP) 8  Tidal Volume (Vt) 719  Patient Home Machine No  Patient Home Mask No  Patient Home Tubing No  Auto Titrate No  Press High Alarm 30 cmH2O  Press Low Alarm 5 cmH2O  Nasal massage performed No (comment)  CPAP/SIPAP surface wiped down Yes  Device Plugged into RED Power Outlet Yes  BiPAP/CPAP /SiPAP Vitals  Pulse Rate (!) 115  Resp 17  SpO2 100 %  Bilateral Breath Sounds Diminished  MEWS Score/Color  MEWS Score 2  MEWS Score Color Yellow

## 2024-08-22 NOTE — ED Provider Notes (Signed)
 [place] EMERGENCY DEPARTMENT AT [facility] Provider Note   CSN: [id number] Arrival date & time: 08/22/24  1120     History Chief Complaint  Patient presents with   Shortness of Breath    HPI: [name] [name] is a 78 y.o. 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 Patient arrived via POV accompanied by sister.  History provided by patient and relative: Sister.  No interpreter required during this encounter.  Patient and sister report that he has severe shortness of breath at baseline that worsens with activity and attempts at ambulation.  Reports that that symptoms have been worsened over the past 24 to 48 hours, and he was severely short of breath this morning therefore they brought him to the emergency department.  Reports that he chronically uses his albuterol , Symbicort  and Brilinta multiple times per day.  Patient denies any fever, chills, chest pain, endorses ongoing dyspnea  Patient's recorded medical, surgical, social, medication list and allergies were reviewed in the Snapshot window as part of the initial history.   Prior to Admission medications   Medication Sig Start Date End Date Taking? Authorizing Provider  acetaminophen  (TYLENOL ) 500 MG tablet Take 1,000 mg by mouth in the morning, at noon, in the evening, and at bedtime.   Yes [provider]  albuterol  (PROVENTIL ) (2.5 MG/3ML) 0.083% nebulizer solution Take 3 mLs (2.5 mg total) by nebulization every 6 (six) hours as needed for wheezing or shortness of breath. 04/17/23  Yes Meade Verdon RAMAN, MD  albuterol  (VENTOLIN  HFA) 108 (90 Base) MCG/ACT inhaler 2 puffs every 4 hours as needed. Allow for patient to fill every 16 days. 06/11/24  Yes Meade Verdon RAMAN, MD  BREZTRI  AEROSPHERE 160-9-4.8 MCG/ACT AERO inhaler Inhale 1 puff into the lungs 2 (two) times  daily. 07/15/24  Yes [provider]  budesonide -formoterol  (SYMBICORT ) 160-4.5 MCG/ACT inhaler Inhale 2 puffs into the lungs 2 (two) times daily. 01/03/24  Yes Meade Verdon RAMAN, MD  COMBIVENT RESPIMAT 20-100 MCG/ACT AERS respimat Inhale 1 puff into the lungs 2 (two) times daily as needed. 09/07/22  Yes [provider]  fenofibrate  (TRICOR ) 145 MG tablet Take 1 tablet (145 mg total) by mouth daily. 08/02/23  Yes Okey Vina GAILS, MD  furosemide  (LASIX ) 40 MG tablet Take 80mg  - 2 tablets by mouth every other day alternating with 40mg  - 1 tablet by mouth 02/09/22  Yes Okey Vina GAILS, MD  lisinopril  (ZESTRIL ) 10 MG tablet Take 10 mg by mouth daily. Patient taking differently: Take 20 mg by mouth daily. 06/26/24  Yes [provider]  Multiple Vitamin (MULTIVITAMIN WITH MINERALS) TABS tablet Take 1 tablet by mouth every other day. Alive   Yes [provider]  potassium chloride  (KLOR-CON  M) 10 MEQ tablet TAKE 2 TABLETS BY MOUTH ON DAYS THAT YOU TAKE 2 LASIX  TABLETS AND 1 TABLET ON DAYS THAT YOU TAKE 1 LASIX  TABLET Patient taking differently: Take 10 mEq by mouth See admin instructions. TAKE 2 TABLETS BY MOUTH ON DAYS THAT YOU TAKE 2 LASIX  TABLETS AND 1 TABLET ON DAYS THAT YOU TAKE 1 LASIX  TABLET 12/13/22  Yes Okey Vina GAILS, MD  rosuvastatin  (CRESTOR ) 5 MG tablet TAKE 1 TABLET(5 MG) BY MOUTH DAILY Patient taking differently: Take 5 mg by mouth daily. 06/25/24  Yes Okey Vina GAILS, MD  amLODipine  (NORVASC ) 2.5 MG tablet Take 1  tablet (2.5 mg total) by mouth daily. Patient not taking: Reported on 08/22/2024 06/21/23   Williams, Evan, PA-C  dextromethorphan-guaiFENesin  (MUCINEX  DM) 30-600 MG per 12 hr tablet Take 1 tablet by mouth 4 (four) times daily. Patient not taking: Reported on 08/22/2024    [provider]  Evolocumab  (REPATHA ) 140 MG/ML SOSY Inject 140 mg into the skin every 14 (fourteen) days. Patient not taking: Reported on 08/07/2024 08/09/23   Okey Vina GAILS, MD   fluticasone  (FLONASE ) 50 MCG/ACT nasal spray Place 1 spray into both nostrils daily. Patient not taking: Reported on 08/22/2024 12/22/22   Meade Verdon RAMAN, MD  ipratropium-albuterol  (DUONEB) 0.5-2.5 (3) MG/3ML SOLN Take 3 mLs by nebulization every 6 (six) hours as needed. Patient not taking: Reported on 08/22/2024 03/07/24   Desai, Nikita S, MD  trolamine salicylate (ASPERCREME) 10 % cream Apply 1 application. topically as needed (Joint pain). Patient not taking: Reported on 08/22/2024    [provider]     Allergies: Atenolol    Review of Systems   ROS as per HPI  Physical Exam Updated Vital Signs BP (!) 114/96 (BP Location: Left Arm)   Pulse (!) 121   Temp 97.8 F (36.6 C) (Axillary)   Resp 18   SpO2 98%  Physical Exam Vitals and nursing note reviewed.  Constitutional:      Appearance: He is well-developed. He is ill-appearing.  HENT:     Head: Normocephalic and atraumatic.  Eyes:     Conjunctiva/sclera: Conjunctivae normal.  Cardiovascular:     Rate and Rhythm: Tachycardia present. Rhythm irregular.     Heart sounds: No murmur heard. Pulmonary:     Effort: Tachypnea and respiratory distress present.     Breath sounds: Decreased breath sounds, wheezing and rales present.  Abdominal:     Palpations: Abdomen is soft.     Tenderness: There is no abdominal tenderness.  Musculoskeletal:        General: No swelling.     Cervical back: Neck supple.  Skin:    General: Skin is warm and dry.     Capillary Refill: Capillary refill takes less than 2 seconds.  Neurological:     Mental Status: He is alert.  Psychiatric:        Mood and Affect: Mood normal.     ED Course/ Medical Decision Making/ A&P    Procedures .Critical Care  Performed by: Rogelia Jerilynn RAMAN, MD Authorized by: Rogelia Jerilynn RAMAN, MD   Critical care provider statement:    Critical care time (minutes):  31   Critical care was necessary to treat or prevent imminent or life-threatening  deterioration of the following conditions:  Respiratory failure (Volume overload resulting in respiratory distress requiring BiPAP)   Critical care was time spent personally by me on the following activities:  Development of treatment plan with patient or surrogate, discussions with consultants, evaluation of patient's response to treatment, examination of patient, ordering and review of laboratory studies, ordering and review of radiographic studies, ordering and performing treatments and interventions, pulse oximetry, re-evaluation of patient's condition and review of old charts   I assumed direction of critical care for this patient from another provider in my specialty: no     Care discussed with: admitting provider      Medications Ordered in ED Medications  fenofibrate  tablet 160 mg (has no administration in time range)  lisinopril  (ZESTRIL ) tablet 20 mg (has no administration in time range)  rosuvastatin  (CRESTOR ) tablet 5 mg (has no administration in  time range)  sodium chloride  flush (NS) 0.9 % injection 3 mL (3 mLs Intravenous Given 08/22/24 1534)  sodium chloride  flush (NS) 0.9 % injection 3 mL (has no administration in time range)  0.9 %  sodium chloride  infusion (has no administration in time range)  acetaminophen  (TYLENOL ) tablet 650 mg (has no administration in time range)  ondansetron  (ZOFRAN ) injection 4 mg (has no administration in time range)  aspirin  EC tablet 81 mg (0 mg Oral Hold 08/22/24 1639)  enoxaparin (LOVENOX) injection 40 mg (has no administration in time range)  furosemide  (LASIX ) injection 40 mg (has no administration in time range)  ipratropium (ATROVENT) nebulizer solution 0.5 mg (0.5 mg Nebulization Given 08/22/24 1442)  guaiFENesin (MUCINEX) 12 hr tablet 600 mg (has no administration in time range)  levalbuterol (XOPENEX) nebulizer solution 1.25 mg (has no administration in time range)  methylPREDNISolone  sodium succinate (SOLU-MEDROL ) 40 mg/mL injection 40 mg  (has no administration in time range)  digoxin (LANOXIN) 0.25 MG/ML injection 0.125 mg (has no administration in time range)  metoprolol  tartrate (LOPRESSOR ) injection 5 mg (5 mg Intravenous Given 08/22/24 1622)  fluticasone  furoate-vilanterol (BREO ELLIPTA) 200-25 MCG/ACT 1 puff (1 puff Inhalation Not Given 08/22/24 1442)  perflutren lipid microspheres (DEFINITY) IV suspension (has no administration in time range)  ipratropium-albuterol  (DUONEB) 0.5-2.5 (3) MG/3ML nebulizer solution 3 mL (3 mLs Nebulization Given 08/22/24 1207)    Followed by  ipratropium-albuterol  (DUONEB) 0.5-2.5 (3) MG/3ML nebulizer solution 3 mL (3 mLs Nebulization Given 08/22/24 1207)    Followed by  ipratropium-albuterol  (DUONEB) 0.5-2.5 (3) MG/3ML nebulizer solution 3 mL (3 mLs Nebulization Given 08/22/24 1206)  methylPREDNISolone  sodium succinate (SOLU-MEDROL ) 125 mg/2 mL injection 125 mg (125 mg Intravenous Given 08/22/24 1226)  furosemide  (LASIX ) injection 80 mg (80 mg Intravenous Given 08/22/24 1258)    Medical Decision Making:   AUBRY TUCHOLSKI is a 78 y.o. male who presents for shortness of breath and respiratory distress as per above.  Physical exam is pertinent for aspiratory distress with tachypnea, cardiac with a regular rhythm.   The differential includes but is not limited to hypoxic respiratory failure, hypercarbic respiratory failure, COPD exacerbation, pneumonia, pneumothorax, pleural effusion, heart failure exacerbation, hypervolemia, metabolic derangement, viral illness.  Independent historian: Relative: Sister  External data reviewed: No pertinent external data  Initial Plan:  Screening labs including CBC and Metabolic panel to evaluate for infectious or metabolic etiology of disease.  RVP given worsening shortness of breath BNP to evaluate volume status given history of heart disease VBG to evaluate for hypercarbic respiratory failure Chest x-ray to evaluate for structural/infectious  intra-thoracic pathology.  EKG to evaluate for cardiac pathology Objective evaluation as below reviewed   Labs: Ordered, Independent interpretation, and Details: VBG without acidosis or hypercarbia.  Significant elevation of BNP to greater than 2000.  CBC without leukocytosis, anemia, thrombocytopenia.  BMP without AKI, emergent electrolyte derangement, hepatic function panel emergent LFT abnormality.  COVID/flu/RSV negative.  Radiology: Ordered, Independent interpretation, Details: Chest x-ray without focal airspace opacification, cardiomediastinal silhouette gentian, pneumothorax, pleural effusion, bony derangement, and All images reviewed independently.  Agree with radiology report at this time.   DG Chest Port 1 View Result Date: 08/22/2024 CLINICAL DATA:  Shortness of breath. EXAM: PORTABLE CHEST 1 VIEW COMPARISON:  Chest radiograph dated 04/29/2020. FINDINGS: No focal consolidation, pleural effusion or pneumothorax. Stable mild cardiomegaly. Median sternotomy wires. No acute osseous pathology. IMPRESSION: 1. No active disease. 2. Mild cardiomegaly. Electronically Signed   By: Vanetta Chou M.D.   On:  08/22/2024 13:35    EKG/Medicine tests: Ordered and Independent interpretation EKG Interpretation:    Interventions: Nebs, Solu-Medrol , Lasix   See the EMR for full details regarding lab and imaging results.  Patient presents in A-fib RVR with respiratory distress.  Patient is wheezy and diminished diffusely.  Given his increased work of breathing, do feel that patient warrants initiation on BiPAP.  Additionally 3 back-to-back DuoNebs and Solu-Medrol  ordered given respiratory status.  Patient warrants labs, chest x-ray.  VBG notably does not demonstrate significant hypercarbic respiratory failure, however patient does have evidence of significant volume overload with BNP greater than 2000.  Patient reevaluated, tolerating BiPAP well, lung sounds improved on exam.  Patient does admit that he  has been nonadherent to his Lasix  at home for 2 to 3 months.  Do feel that patient requires admission for continued BiPAP and volume coloration, given Lasix  80 IV given home 40 p.o.  Will not attempt to rate control as feel that patient's A-fib RVR is likely resultant of his volume overload and he may be cardiac output dependent, and patient is otherwise normotensive and is overall having low rates of A-fib RVR in the low 100s to 130s.  Medicine consulted for admission, discussed with Dr. Laurita who accepted the patient to his service.  No additional acute events while patient was under my care.  Presentation is most consistent with acute life/limb-threatening illness and Current presentation is complicated by underlying chronic conditions  Discussion of management or test interpretations with external provider(s): Dr. Laurita, hospitalists  Risk Drugs:Prescription drug management Treatment: Decision regarding hospitalization Critical Care: 31 minutes  Disposition: ADMIT: I believe the patient requires admission for further care and management. The patient was admitted to hospitalist service. Please see inpatient provider note for additional treatment plan details.   MDM generated using voice dictation software and may contain dictation errors.  Please contact me for any clarification or with any questions.  Clinical Impression:  1. Acute exacerbation of chronic heart failure (HCC)      Admit   Final Clinical Impression(s) / ED Diagnoses Final diagnoses:  Acute exacerbation of chronic heart failure [facility])    Rx / DC Orders ED Discharge Orders     None        Rogelia Jerilynn RAMAN, MD 08/22/24 1644

## 2024-08-22 NOTE — Progress Notes (Signed)
   08/22/24 1919  BiPAP/CPAP/SIPAP  BiPAP/CPAP/SIPAP Pt Type Adult (currently on 4L [place])  BiPAP/CPAP /SiPAP Vitals  Pulse Rate (!) 114  SpO2 96 %  Bilateral Breath Sounds Diminished  MEWS Score/Color  MEWS Score 2  MEWS Score Color Yellow

## 2024-09-04 ENCOUNTER — Ambulatory Visit: Admitting: Internal Medicine

## 2024-09-10 ENCOUNTER — Other Ambulatory Visit: Payer: Self-pay | Admitting: Internal Medicine

## 2024-09-17 NOTE — Discharge Summary (Signed)
 Physician Discharge Summary   Patient: [name] [name] MRN: [id number] DOB: [birth year]  Admit date:     08/22/2024  Discharge date: 08/22/2024  Discharge Physician: Cort ONEIDA Mana   PCP: Shelda Atlas, MD   Recommendations at discharge:    PCP in 1-3 days  Discharge Diagnoses: Principal Problem:   CHF (congestive heart failure) (HCC) Active Problems:   COPD with acute exacerbation (HCC)   Acute on chronic combined systolic and diastolic CHF (congestive heart failure) (HCC)  Resolved Problems:   * No resolved hospital problems. 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  Patient has not been compliant with diuresis because of  inconvenience .  Diagnosed showed significant symptoms and signs of CHF decompensation, plan was for patient to stay in the hospital for IV diuresis.  Patient however signed out AMA.  Patient was read about possible consequences of noncompliance with worsening of CHF, worsening of hypoxia, despite chose to leave the hospital AMA.      Pain control - [place]  Controlled Substance Reporting System database was reviewed. and patient was instructed, not to drive, operate heavy machinery, perform activities at heights, swimming or participation in water activities or provide baby-sitting services while on Pain, Sleep and Anxiety Medications; until their outpatient Physician has advised to do so again. Also recommended to not to take more than prescribed Pain, Sleep and Anxiety Medications.  Consultants: None Procedures performed: None Disposition: Home Diet recommendation:  Cardiac diet DISCHARGE MEDICATION: Allergies as of 08/22/2024       Reactions   Atenolol  Shortness Of Breath        Medication List     ASK your doctor  about these medications    acetaminophen  500 MG tablet Commonly known as: TYLENOL  Take 1,000 mg by mouth in the morning, at noon, in the evening, and at bedtime.   albuterol  (2.5 MG/3ML) 0.083% nebulizer solution Commonly known as: PROVENTIL  Take 3 mLs (2.5 mg total) by nebulization every 6 (six) hours as needed for wheezing or shortness of breath.   albuterol  108 (90 Base) MCG/ACT inhaler Commonly known as: VENTOLIN  HFA 2 puffs every 4 hours as needed. Allow for patient to fill every 16 days.   amLODipine  2.5 MG tablet Commonly known as: NORVASC  Take 1 tablet (2.5 mg total) by mouth daily.   Breztri  Aerosphere 160-9-4.8 MCG/ACT Aero inhaler Generic drug: budesonide -glycopyrrolate-formoterol  Inhale 1 puff into the lungs 2 (two) times daily.   budesonide -formoterol  160-4.5 MCG/ACT inhaler Commonly known as: SYMBICORT  Inhale 2 puffs into the lungs 2 (two) times daily.   Combivent Respimat 20-100 MCG/ACT Aers respimat Generic drug: Ipratropium-Albuterol  Inhale 1 puff into the lungs 2 (two) times daily as needed.   ipratropium-albuterol  0.5-2.5 (3) MG/3ML Soln Commonly known as: DUONEB Take 3 mLs by nebulization every 6 (six) hours as needed.   dextromethorphan-guaiFENesin  30-600 MG 12hr tablet Commonly known as: MUCINEX  DM Take 1 tablet by mouth 4 (four) times daily.   fenofibrate  145 MG tablet Commonly known as: TRICOR  Take 1 tablet (145 mg total) by mouth daily.   fluticasone  50 MCG/ACT nasal spray Commonly known as: FLONASE  Place 1 spray into both nostrils daily.   furosemide  40 MG tablet Commonly known as: LASIX  Take 80mg  - 2 tablets by mouth every other day alternating with 40mg  - 1 tablet by mouth  lisinopril  10 MG tablet Commonly known as: ZESTRIL  Take 10 mg by mouth daily.   multivitamin with minerals Tabs tablet Take 1 tablet by mouth every other day. Alive   potassium chloride  10 MEQ tablet Commonly known as: KLOR-CON  M TAKE 2 TABLETS BY MOUTH ON  DAYS THAT YOU TAKE 2 LASIX  TABLETS AND 1 TABLET ON DAYS THAT YOU TAKE 1 LASIX  TABLET   Repatha  140 MG/ML Sosy Generic drug: Evolocumab  Inject 140 mg into the skin every 14 (fourteen) days.   rosuvastatin  5 MG tablet Commonly known as: CRESTOR  TAKE 1 TABLET(5 MG) BY MOUTH DAILY   trolamine salicylate 10 % cream Commonly known as: ASPERCREME Apply 1 application. topically as needed (Joint pain).        Discharge Exam: There were no vitals filed for this visit. Eyes: PERRL, lids and conjunctivae normal ENMT: Mucous membranes are moist. Posterior pharynx clear of any exudate or lesions.Normal dentition.  Neck: normal, supple, no masses, no thyromegaly Respiratory: Diffused wheezing and crackles bilaterally, increasing breathing effort no accessory muscle use.  Cardiovascular: Regular rate and rhythm, no murmurs / rubs / gallops. 2+ extremity edema. 2+ pedal pulses. No carotid bruits.  Abdomen: no tenderness, no masses palpated. No hepatosplenomegaly. Bowel sounds positive.  Musculoskeletal: no clubbing / cyanosis. No joint deformity upper and lower extremities. Good ROM, no contractures. Normal muscle tone.  Skin: no rashes, lesions, ulcers. No induration Neurologic: CN 2-12 grossly intact. Sensation intact, DTR normal.  Muscle strength 5/5 on both sides Psychiatric: Normal judgment and insight. Alert and oriented x 3. Normal mood.     Condition at discharge: poor  The results of significant diagnostics from this hospitalization (including imaging, microbiology, ancillary and laboratory) are listed below for reference.   Imaging Studies: ECHOCARDIOGRAM COMPLETE Result Date: 08/22/2024    ECHOCARDIOGRAM REPORT   Patient Name:   [name] [name] Date of Exam: 08/22/2024 Medical Rec #:  [id number]       Height:       66.0 in Accession #:    [id number]      Weight:       152.0 lb Date of Birth:  05-01-1946       BSA:          1.780 m Patient Age:    78 years        BP:           110/96  mmHg Patient Gender: M               HR:           112 bpm. Exam Location:  Inpatient Procedure: 2D Echo (Both Spectral and Color Flow Doppler were utilized during            procedure). Indications:    CHF  History:        Patient has prior history of Echocardiogram examinations. CHF;                 COPD.                 Aortic Valve: 25 mm bioprosthetic valve is present in the aortic                 position. Procedure Date: 2007.  Sonographer:    Charmaine Gaskins Referring Phys: [id number] Bettyjane Shenoy T LAURITA  Sonographer Comments: Image acquisition challenging due to uncooperative patient. Attempted Definity . Unable to finish exam. Patient unable to breath and had to sit up in bed IMPRESSIONS  1. Left ventricular ejection fraction, by estimation, is 15%. The left ventricle has severely decreased function. The left ventricle demonstrates global hypokinesis. The left ventricular internal cavity size was moderately dilated. There is mild left ventricular hypertrophy. Left ventricular diastolic parameters are indeterminate.  2. Right ventricular systolic function is moderately reduced. The right ventricular size is normal.  3. Left atrial size was moderately dilated.  4. The mitral valve is grossly normal. Mild mitral valve regurgitation.  5. The aortic valve has been repaired/replaced. Aortic valve regurgitation is trivial. Severe prosthetic aortic valve stenosis. There is a 25 mm bioprosthetic valve present in the aortic position. Procedure Date: 2007. Aortic valve area, by VTI measures  0.68 cm. Aortic valve mean gradient measures 34.0 mmHg. Aortic valve Vmax measures 3.89 m/s.  6. Aortic dilatation noted. 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 There is moderate dilatation of the aortic root. 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  7. The inferior vena cava is normal in size with greater than 50% respiratory variability, suggesting right atrial pressure  of 3 mmHg. FINDINGS  Left Ventricle: Left ventricular ejection fraction, by estimation, is 15%. The left ventricle has severely decreased function. The left ventricle demonstrates global hypokinesis. The left ventricular internal cavity size was moderately dilated. There is  mild left ventricular hypertrophy. Left ventricular diastolic parameters are indeterminate. Right Ventricle: The right ventricular size is normal. No increase in right ventricular wall thickness. Right ventricular systolic function is moderately reduced. Left Atrium: Left atrial size was moderately dilated. Right Atrium: Right atrial size was normal in size. Pericardium: There is no evidence of pericardial effusion. Mitral Valve: The mitral valve is grossly normal. Mild mitral valve regurgitation. Tricuspid Valve: The tricuspid valve is normal in structure. Tricuspid valve regurgitation is mild . No evidence of tricuspid stenosis. Aortic Valve: The aortic valve has been repaired/replaced. Aortic valve regurgitation is trivial. Severe aortic stenosis is present. Aortic valve mean gradient measures 34.0 mmHg. Aortic valve peak gradient measures 60.5 mmHg. Aortic valve area, by VTI measures 0.68 cm. There is a 25 mm bioprosthetic valve present in the aortic position. Procedure Date: 2007. Pulmonic Valve: The pulmonic valve was normal in structure. Pulmonic valve regurgitation is trivial. No evidence of pulmonic stenosis. Aorta: Aortic dilatation noted. There is moderate dilatation of the aortic root. 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 There is an aneurysm involving the aortic root measuring 48 mm. There is an aneurysm involving the ascending aorta measuring 52 mm. Venous: The inferior vena cava is normal in size with greater than 50% respiratory variability, suggesting right atrial pressure of 3 mmHg. IAS/Shunts: The atrial septum is grossly normal.  LEFT VENTRICLE PLAX 2D LVIDd:         6.20 cm LVIDs:         5.80 cm LV PW:          1.20 cm LV IVS:        1.40 cm LVOT diam:     2.50 cm LV SV:         50 LV SV Index:   28 LVOT Area:     4.91 cm  RIGHT VENTRICLE RV Basal diam:  3.30 cm RV Mid diam:    2.20 cm RV S prime:     8.70 cm/s TAPSE (M-mode): 1.3 cm LEFT ATRIUM             Index  RIGHT ATRIUM           Index LA diam:        4.60 cm 2.58 cm/m   RA Area:     18.20 cm LA Vol (A2C):   98.4 ml 55.29 ml/m  RA Volume:   48.80 ml  27.42 ml/m LA Vol (A4C):   84.3 ml 47.37 ml/m LA Biplane Vol: 93.3 ml 52.43 ml/m  AORTIC VALVE AV Area (Vmax):    1.01 cm AV Area (Vmean):   0.98 cm AV Area (VTI):     0.68 cm AV Vmax:           389.00 cm/s AV Vmean:          260.667 cm/s AV VTI:            0.737 m AV Peak Grad:      60.5 mmHg AV Mean Grad:      34.0 mmHg LVOT Vmax:         80.40 cm/s LVOT Vmean:        51.967 cm/s LVOT VTI:          0.102 m LVOT/AV VTI ratio: 0.14  AORTA Ao Root diam: 4.75 cm Ao Asc diam:  5.20 cm  SHUNTS Systemic VTI:  0.10 m Systemic Diam: 2.50 cm Soyla Merck MD Electronically signed by Soyla Merck MD Signature Date/Time: 08/22/2024/11:00:04 PM    Final    DG Chest Port 1 View Result Date: 08/22/2024 CLINICAL DATA:  Shortness of breath. EXAM: PORTABLE CHEST 1 VIEW COMPARISON:  Chest radiograph dated 04/29/2020. FINDINGS: No focal consolidation, pleural effusion or pneumothorax. Stable mild cardiomegaly. Median sternotomy wires. No acute osseous pathology. IMPRESSION: 1. No active disease. 2. Mild cardiomegaly. Electronically Signed   By: Vanetta Chou M.D.   On: 08/22/2024 13:35    Microbiology: Results for orders placed or performed during the hospital encounter of 08/22/24  Resp panel by RT-PCR (RSV, Flu A&B, Covid) Anterior Nasal Swab     Status: None   Collection Time: 08/22/24 12:03 PM   Specimen: Anterior Nasal Swab  Result Value Ref Range Status   SARS Coronavirus 2 by RT PCR NEGATIVE NEGATIVE Final    Comment: (NOTE) SARS-CoV-2 target nucleic acids are NOT DETECTED.  The SARS-CoV-2  RNA is generally detectable in upper respiratory specimens during the acute phase of infection. The lowest concentration of SARS-CoV-2 viral copies this assay can detect is 138 copies/mL. A negative result does not preclude SARS-Cov-2 infection and should not be used as the sole basis for treatment or other patient management decisions. A negative result may occur with  improper specimen collection/handling, submission of specimen other than nasopharyngeal swab, presence of viral mutation(s) within the areas targeted by this assay, and inadequate number of viral copies(<138 copies/mL). A negative result must be combined with clinical observations, patient history, and epidemiological information. The expected result is Negative.  Fact Sheet for Patients:  bloggercourse.com  Fact Sheet for Healthcare Providers:  seriousbroker.it  This test is no t yet approved or cleared by the [place]  FDA and  has been authorized for detection and/or diagnosis of SARS-CoV-2 by FDA under an Emergency Use Authorization (EUA). This EUA will remain  in effect (meaning this test can be used) for the duration of the COVID-19 declaration under Section 564(b)(1) of the Act, 21 U.S.C.section 360bbb-3(b)(1), unless the authorization is terminated  or revoked sooner.       Influenza A by PCR NEGATIVE NEGATIVE Final   Influenza B by  PCR NEGATIVE NEGATIVE Final    Comment: (NOTE) The Xpert Xpress SARS-CoV-2/FLU/RSV plus assay is intended as an aid in the diagnosis of influenza from Nasopharyngeal swab specimens and should not be used as a sole basis for treatment. Nasal washings and aspirates are unacceptable for Xpert Xpress SARS-CoV-2/FLU/RSV testing.  Fact Sheet for Patients: bloggercourse.com  Fact Sheet for Healthcare Providers: seriousbroker.it  This test is not yet approved or cleared by the  [place]  FDA and has been authorized for detection and/or diagnosis of SARS-CoV-2 by FDA under an Emergency Use Authorization (EUA). This EUA will remain in effect (meaning this test can be used) for the duration of the COVID-19 declaration under Section 564(b)(1) of the Act, 21 U.S.C. section 360bbb-3(b)(1), unless the authorization is terminated or revoked.     Resp Syncytial Virus by PCR NEGATIVE NEGATIVE Final    Comment: (NOTE) Fact Sheet for Patients: bloggercourse.com  Fact Sheet for Healthcare Providers: seriousbroker.it  This test is not yet approved or cleared by the [place]  FDA and has been authorized for detection and/or diagnosis of SARS-CoV-2 by FDA under an Emergency Use Authorization (EUA). This EUA will remain in effect (meaning this test can be used) for the duration of the COVID-19 declaration under Section 564(b)(1) of the Act, 21 U.S.C. section 360bbb-3(b)(1), unless the authorization is terminated or revoked.  Performed at [facility], [address] [place], [place] [address]     Labs: CBC: No results for input(s): WBC, NEUTROABS, HGB, HCT, MCV, PLT in the last 168 hours. Basic Metabolic Panel: No results for input(s): NA, K, CL, CO2, GLUCOSE, BUN, CREATININE, CALCIUM , MG, PHOS in the last 168 hours. Liver Function Tests: No results for input(s): AST, ALT, ALKPHOS, BILITOT, PROT, ALBUMIN in the last 168 hours. CBG: No results for input(s): GLUCAP in the last 168 hours.  Discharge time spent: less than 30 minutes.  Signed: Cort ONEIDA Mana, MD [facility] 09/17/2024

## 2024-09-23 ENCOUNTER — Ambulatory Visit: Admitting: Internal Medicine

## 2024-09-25 ENCOUNTER — Other Ambulatory Visit: Payer: Self-pay | Admitting: Internal Medicine

## 2024-10-02 ENCOUNTER — Other Ambulatory Visit: Payer: Self-pay | Admitting: Internal Medicine

## 2024-10-02 NOTE — Telephone Encounter (Signed)
 Copied from CRM [id number]. Topic: Clinical - Medication Refill >> Oct 02, 2024  9:08 AM Essie A wrote: Medication: budesonide -formoterol  (SYMBICORT ) 160-4.5 MCG/ACT inhaler  Has the patient contacted their pharmacy? Yes (Agent: If no, request that the patient contact the pharmacy for the refill. If patient does not wish to contact the pharmacy document the reason why and proceed with request.) (Agent: If yes, when and what did the pharmacy advise?)  This is the patient's preferred pharmacy:  [id number] - [place], [place] - [address] AT [facility] OF [address] [place] [address] Phone: [phone] Fax: [phone]  Is this the correct pharmacy for this prescription? Yes If no, delete pharmacy and type the correct one.   Has the prescription been filled recently? Yes  Is the patient out of the medication? No  Has the patient been seen for an appointment in the last year OR does the patient have an upcoming appointment? Yes  Can we respond through MyChart? No  Agent: Please be advised that Rx refills may take up to 3 business days. We ask that you follow-up with your pharmacy.
